# Patient Record
Sex: Female | Born: 1970 | Race: Black or African American | Hispanic: No | Marital: Single | Smoking: Former smoker
Health system: Southern US, Community
[De-identification: ages and names within clinical notes are randomized; demographics above are authoritative.]

## PROBLEM LIST (undated history)

## (undated) DIAGNOSIS — J449 Chronic obstructive pulmonary disease, unspecified: Secondary | ICD-10-CM

## (undated) DIAGNOSIS — E785 Hyperlipidemia, unspecified: Secondary | ICD-10-CM

## (undated) DIAGNOSIS — F419 Anxiety disorder, unspecified: Secondary | ICD-10-CM

## (undated) DIAGNOSIS — I10 Essential (primary) hypertension: Secondary | ICD-10-CM

## (undated) HISTORY — DX: Chronic obstructive pulmonary disease, unspecified: J44.9

## (undated) HISTORY — DX: Essential (primary) hypertension: I10

## (undated) HISTORY — PX: TUBAL LIGATION: SHX77

## (undated) HISTORY — DX: Hyperlipidemia, unspecified: E78.5

## (undated) HISTORY — DX: Anxiety disorder, unspecified: F41.9

## (undated) HISTORY — PX: OTHER SURGICAL HISTORY: SHX169

---

## 1998-02-13 ENCOUNTER — Other Ambulatory Visit: Admission: RE | Admit: 1998-02-13 | Discharge: 1998-02-13 | Payer: Self-pay | Admitting: Obstetrics and Gynecology

## 1998-03-28 ENCOUNTER — Other Ambulatory Visit: Admission: RE | Admit: 1998-03-28 | Discharge: 1998-03-28 | Payer: Self-pay | Admitting: Obstetrics & Gynecology

## 1998-05-03 ENCOUNTER — Ambulatory Visit (HOSPITAL_COMMUNITY): Admission: RE | Admit: 1998-05-03 | Discharge: 1998-05-03 | Payer: Self-pay | Admitting: Obstetrics & Gynecology

## 2000-09-24 ENCOUNTER — Encounter (INDEPENDENT_AMBULATORY_CARE_PROVIDER_SITE_OTHER): Payer: Self-pay | Admitting: *Deleted

## 2001-01-04 ENCOUNTER — Encounter: Admission: RE | Admit: 2001-01-04 | Discharge: 2001-01-04 | Payer: Self-pay | Admitting: Family Medicine

## 2001-10-23 ENCOUNTER — Other Ambulatory Visit: Admission: RE | Admit: 2001-10-23 | Discharge: 2001-10-23 | Payer: Self-pay | Admitting: Obstetrics and Gynecology

## 2001-10-31 ENCOUNTER — Ambulatory Visit (HOSPITAL_COMMUNITY): Admission: RE | Admit: 2001-10-31 | Discharge: 2001-10-31 | Payer: Self-pay | Admitting: Obstetrics and Gynecology

## 2001-10-31 ENCOUNTER — Encounter (INDEPENDENT_AMBULATORY_CARE_PROVIDER_SITE_OTHER): Payer: Self-pay | Admitting: Specialist

## 2002-07-06 ENCOUNTER — Encounter: Admission: RE | Admit: 2002-07-06 | Discharge: 2002-07-06 | Payer: Self-pay | Admitting: Internal Medicine

## 2003-05-30 ENCOUNTER — Encounter: Admission: RE | Admit: 2003-05-30 | Discharge: 2003-05-30 | Payer: Self-pay | Admitting: Internal Medicine

## 2003-09-30 ENCOUNTER — Emergency Department (HOSPITAL_COMMUNITY): Admission: AD | Admit: 2003-09-30 | Discharge: 2003-09-30 | Payer: Self-pay | Admitting: Family Medicine

## 2004-02-04 ENCOUNTER — Inpatient Hospital Stay (HOSPITAL_COMMUNITY): Admission: AD | Admit: 2004-02-04 | Discharge: 2004-02-05 | Payer: Self-pay | Admitting: *Deleted

## 2004-06-10 ENCOUNTER — Inpatient Hospital Stay (HOSPITAL_COMMUNITY): Admission: AD | Admit: 2004-06-10 | Discharge: 2004-06-10 | Payer: Self-pay | Admitting: Obstetrics and Gynecology

## 2004-07-10 ENCOUNTER — Inpatient Hospital Stay (HOSPITAL_COMMUNITY): Admission: AD | Admit: 2004-07-10 | Discharge: 2004-07-11 | Payer: Self-pay | Admitting: Obstetrics and Gynecology

## 2004-08-04 ENCOUNTER — Inpatient Hospital Stay (HOSPITAL_COMMUNITY): Admission: AD | Admit: 2004-08-04 | Discharge: 2004-08-04 | Payer: Self-pay | Admitting: Obstetrics and Gynecology

## 2004-08-31 ENCOUNTER — Inpatient Hospital Stay (HOSPITAL_COMMUNITY): Admission: AD | Admit: 2004-08-31 | Discharge: 2004-08-31 | Payer: Self-pay | Admitting: Obstetrics and Gynecology

## 2004-11-09 ENCOUNTER — Inpatient Hospital Stay (HOSPITAL_COMMUNITY): Admission: AD | Admit: 2004-11-09 | Discharge: 2004-11-11 | Payer: Self-pay | Admitting: Obstetrics and Gynecology

## 2004-11-18 ENCOUNTER — Inpatient Hospital Stay (HOSPITAL_COMMUNITY): Admission: AD | Admit: 2004-11-18 | Discharge: 2004-11-20 | Payer: Self-pay | Admitting: Obstetrics and Gynecology

## 2004-11-23 ENCOUNTER — Inpatient Hospital Stay (HOSPITAL_COMMUNITY): Admission: AD | Admit: 2004-11-23 | Discharge: 2004-11-30 | Payer: Self-pay | Admitting: Obstetrics and Gynecology

## 2004-11-28 ENCOUNTER — Encounter (INDEPENDENT_AMBULATORY_CARE_PROVIDER_SITE_OTHER): Payer: Self-pay | Admitting: *Deleted

## 2004-11-30 ENCOUNTER — Encounter (INDEPENDENT_AMBULATORY_CARE_PROVIDER_SITE_OTHER): Payer: Self-pay | Admitting: *Deleted

## 2005-01-11 ENCOUNTER — Other Ambulatory Visit: Admission: RE | Admit: 2005-01-11 | Discharge: 2005-01-11 | Payer: Self-pay | Admitting: Obstetrics and Gynecology

## 2005-07-19 IMAGING — US US OB COMP LESS 14 WK
1 series · 14 of 28 positions shown · non-contrast
Comparison: none

CLINICAL DATA: Early pregnancy.  5 week 3 day gestational age by LMP.  Severe headaches.  Evaluate viability and dating.
 OBSTETRICAL ULTRASOUND WITH TRANSVAGINAL:
 A single intrauterine gestational sac is seen on today?s study with a mean sac diameter of 7 mm, corresponding with a gestational age of 5 weeks 3 days.  A yolk sac is seen.  There is no evidence of subchorionic hemorrhage.  No fibroids or other uterine abnormality is identified.  
 A complex cystic lesion is noted in the right ovary measuring 2.3 x 1.4 cm which is consistent with a hemorrhagic corpus luteum.  The left ovary is normal in appearance.  A tiny amount of free fluid is noted.  
 IMPRESSION
 Single intrauterine gestational sac, with estimated gestational age of 5 weeks 3 days by mean sac diameter.  This agrees with stated LMP.  
 2.3 cm hemorrhagic corpus luteum noted in the right ovary.  Small amount of free fluid also noted.

[Series 1: us ob comp less 14 wk · 0.33mm/px · 14 of 40 slices shown]
[im 2/40]
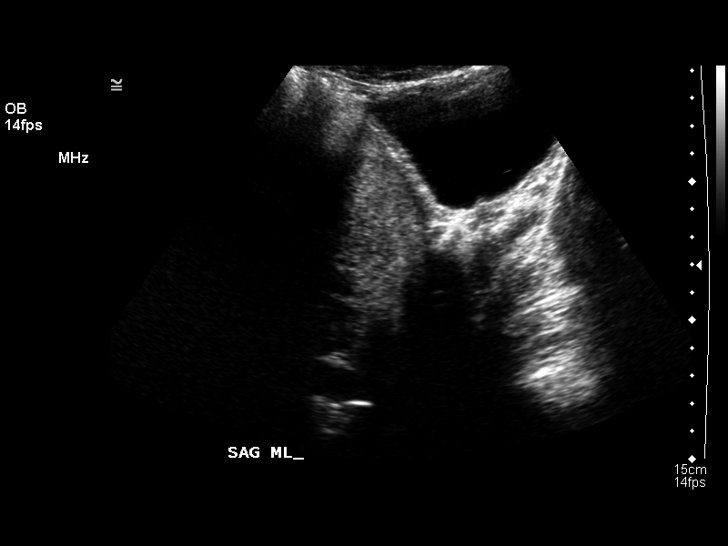
[im 5/40]
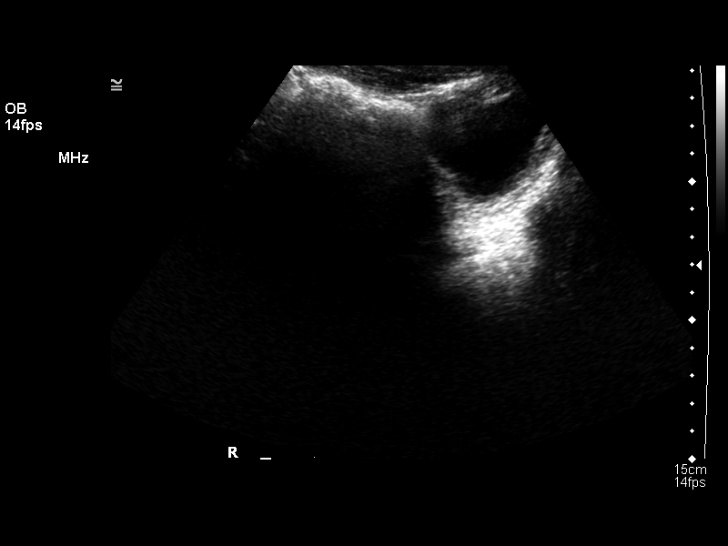
[im 8/40]
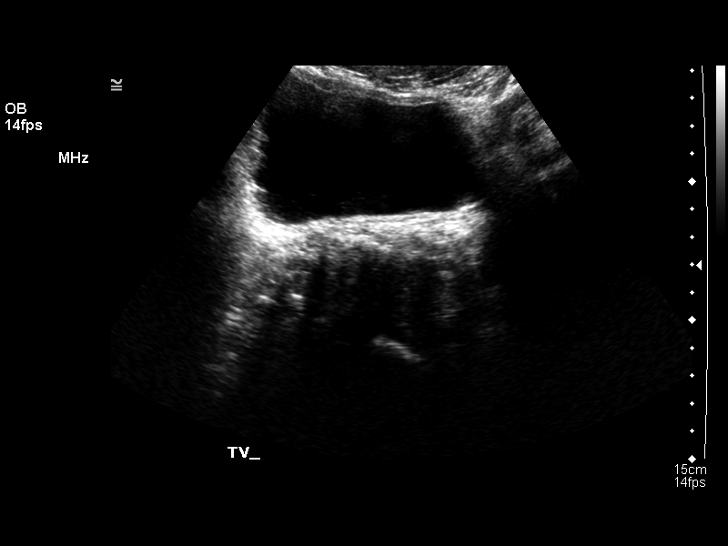
[im 11/40]
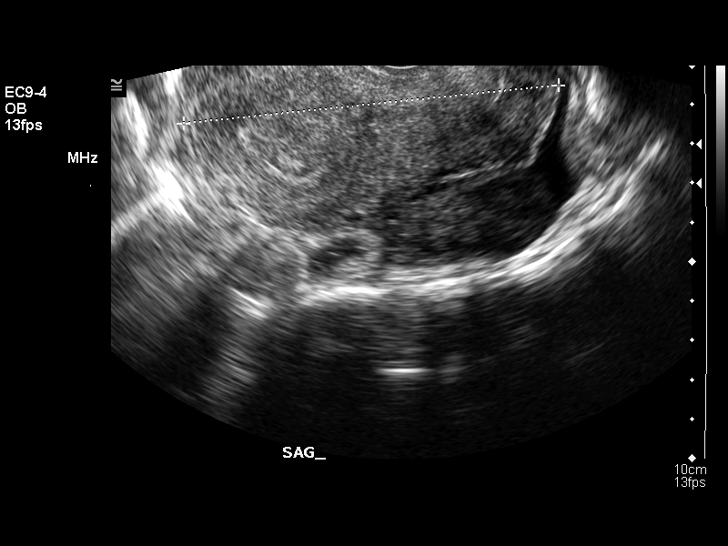
[im 14/40]
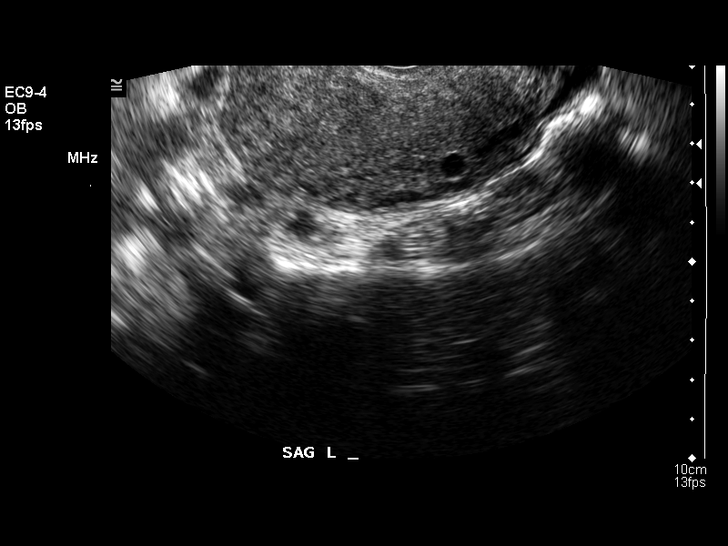
[im 16/40]
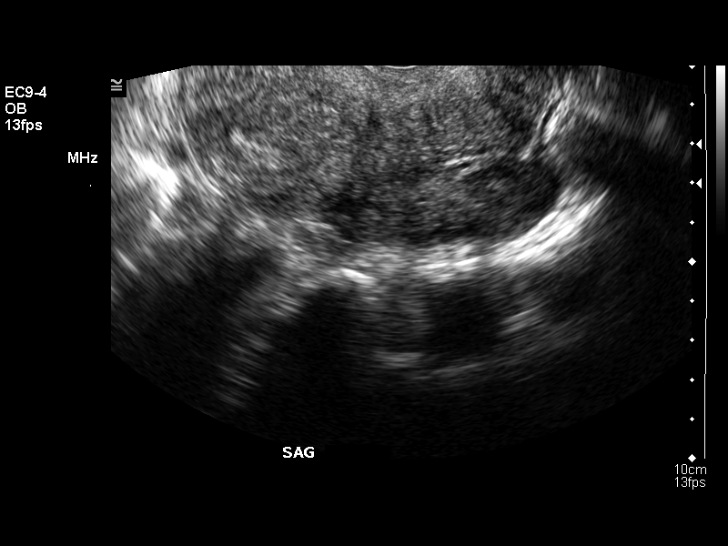
[im 19/40]
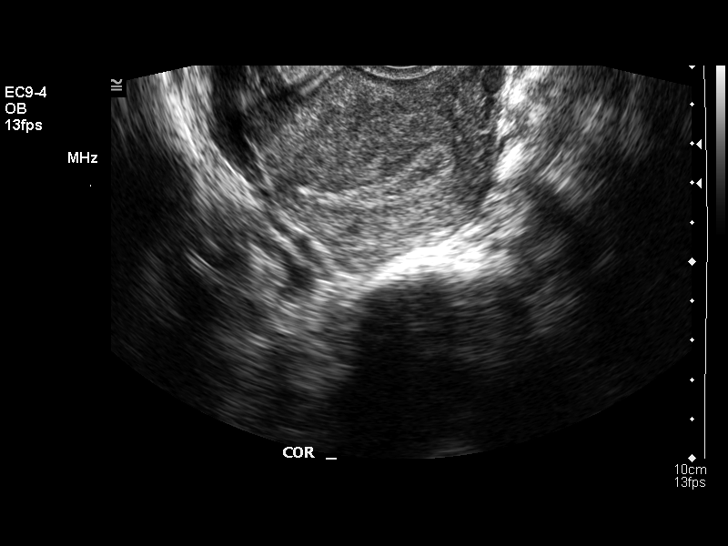
[im 22/40]
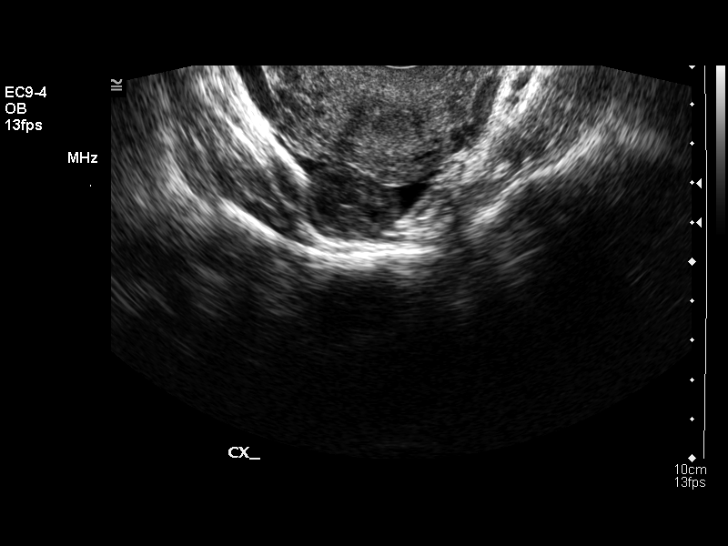
[im 25/40]
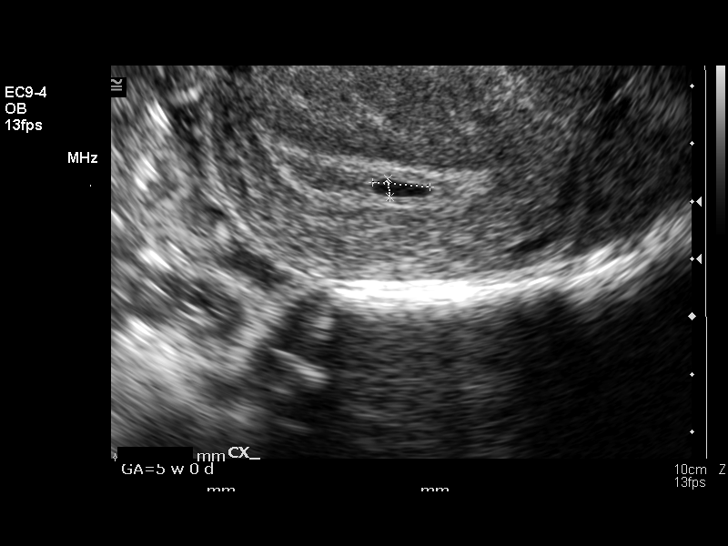
[im 28/40]
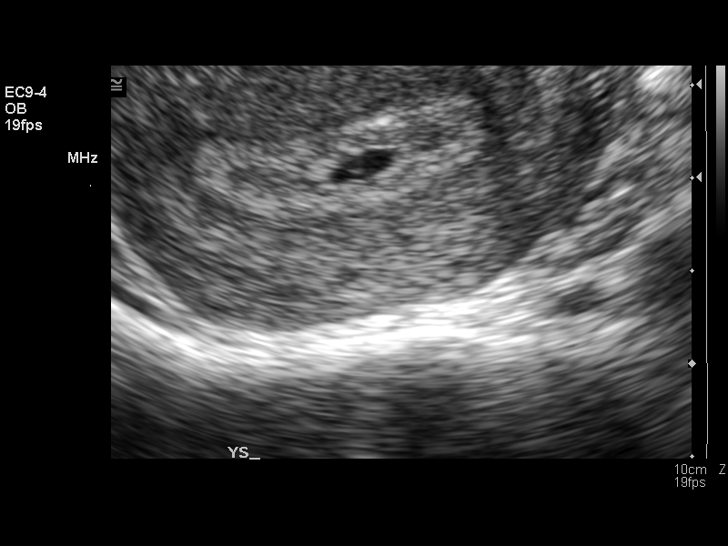
[im 31/40]
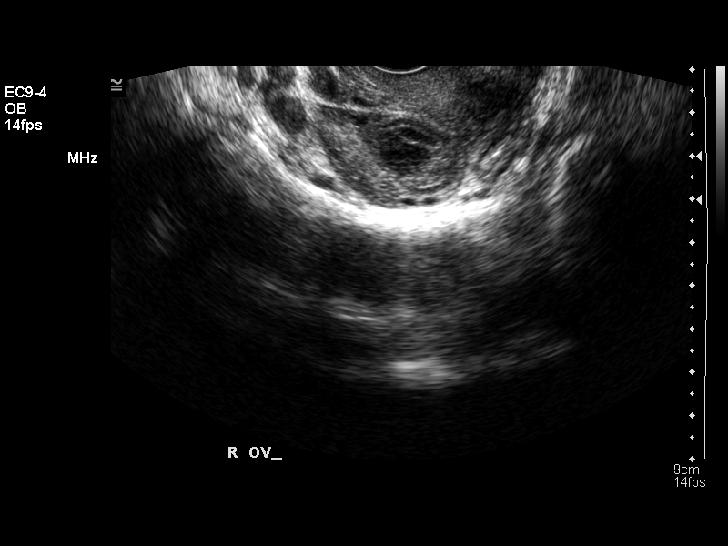
[im 34/40]
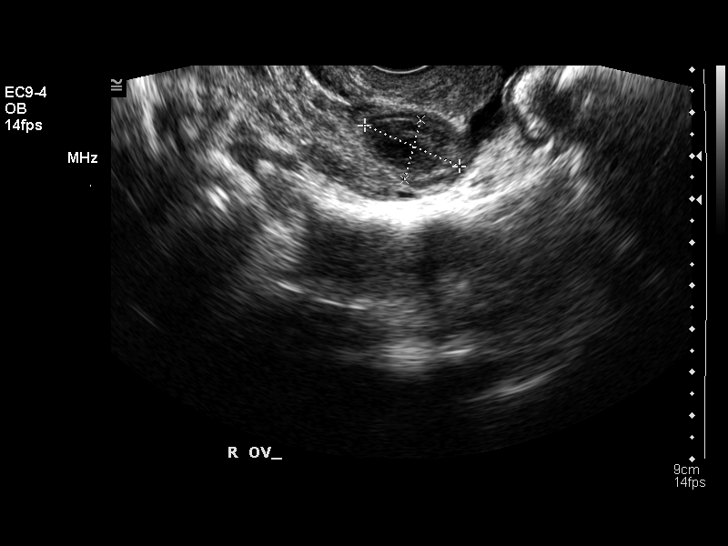
[im 37/40]
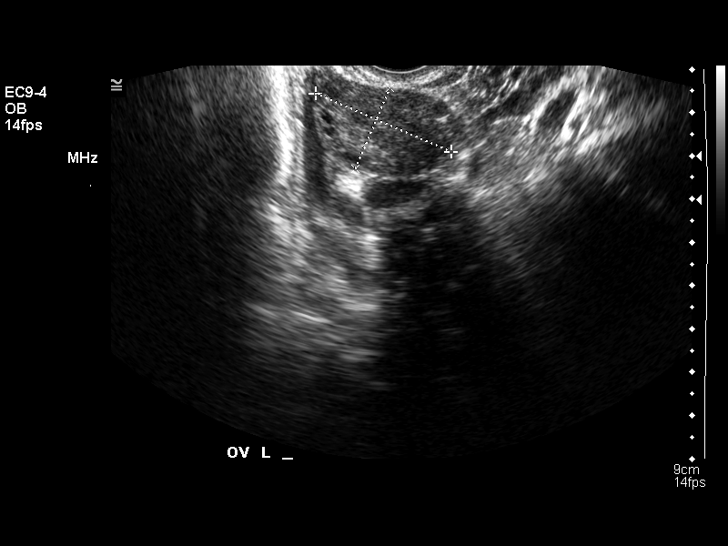
[im 40/40]
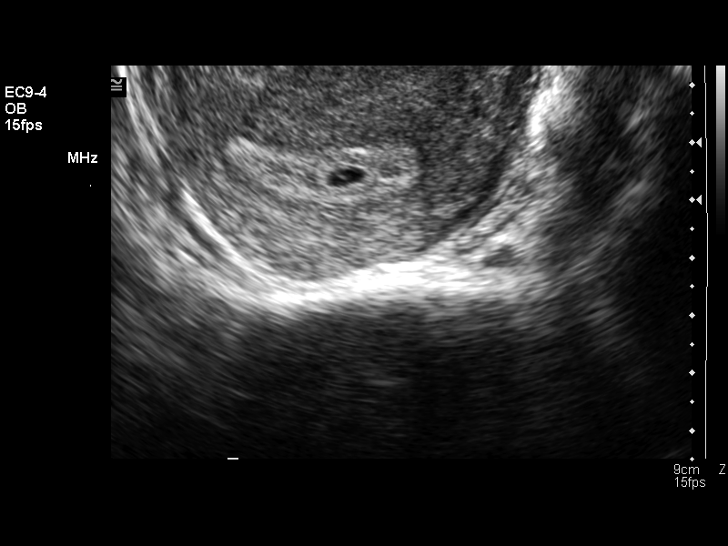

[14 of 28 positions shown; findings below may reference images not displayed]

## 2005-08-19 IMAGING — US US OB COMP LESS 14 WK
1 series · 18 of 28 positions shown · non-contrast
Comparison: 06/10/04.

CLINICAL DATA: 9 weeks pregnancy with vaginal bleeding.
 ULTRASOUND OB COMPLETE <14 WEEKS ? 07/11/04

[Series 1: us ob comp<14 wk · 18 of 31 slices shown]
[im 1/31]
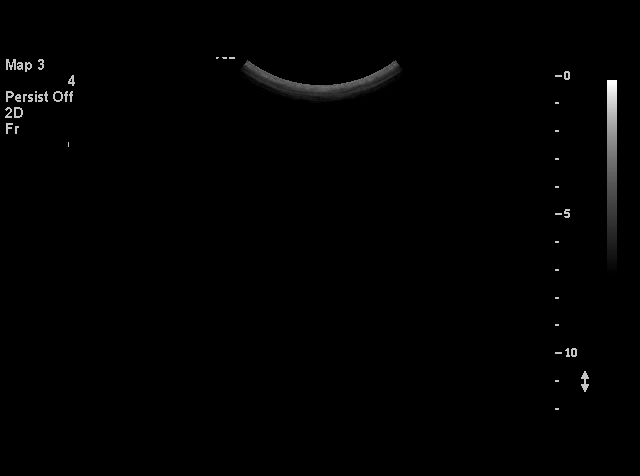
[im 3/31]
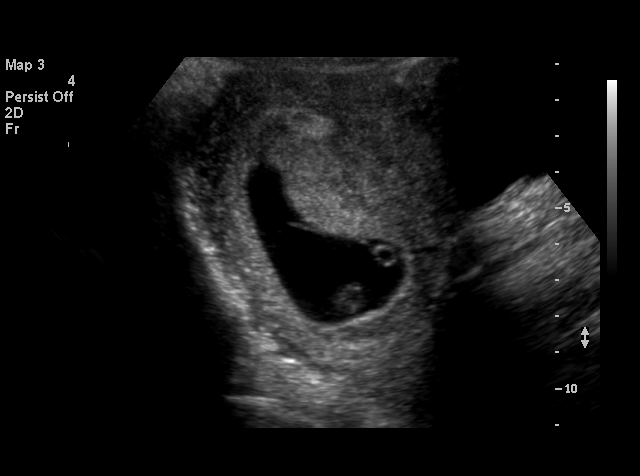
[im 4/31]
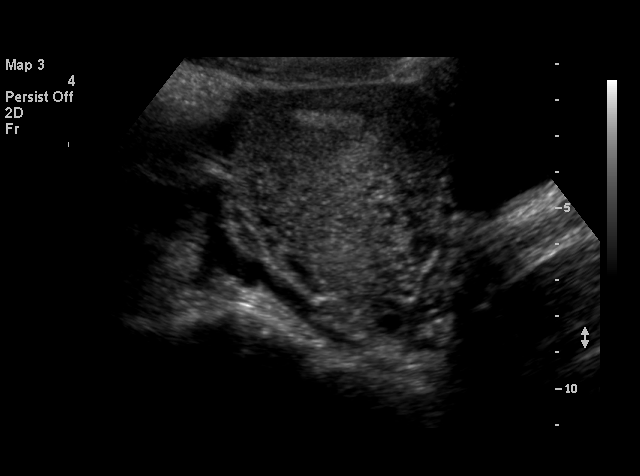
[im 6/31]
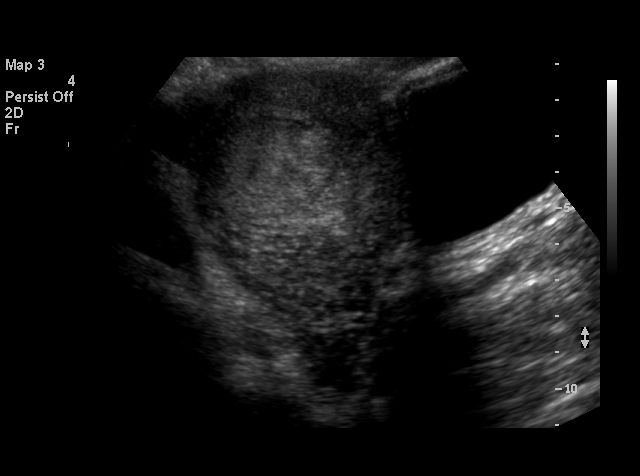
[im 8/31]
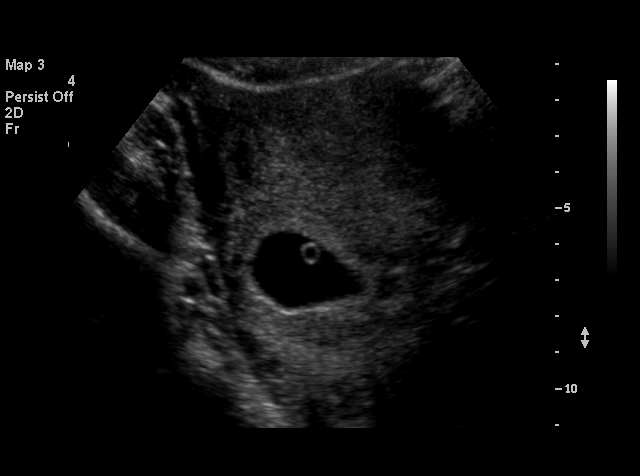
[im 9/31]
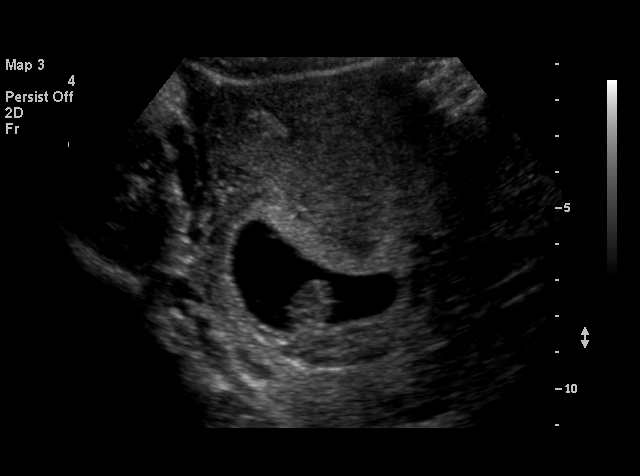
[im 12/31]
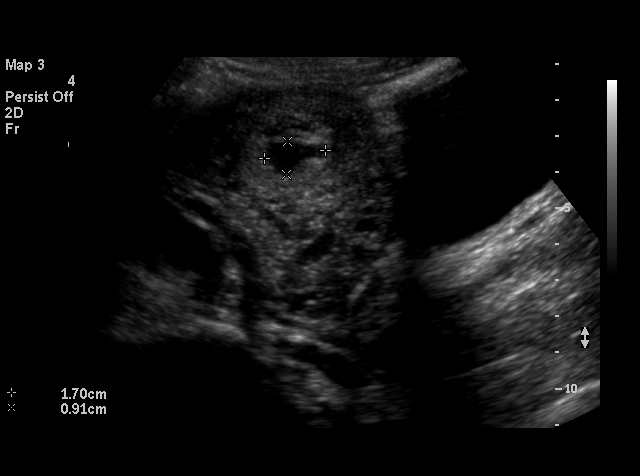
[im 13/31]
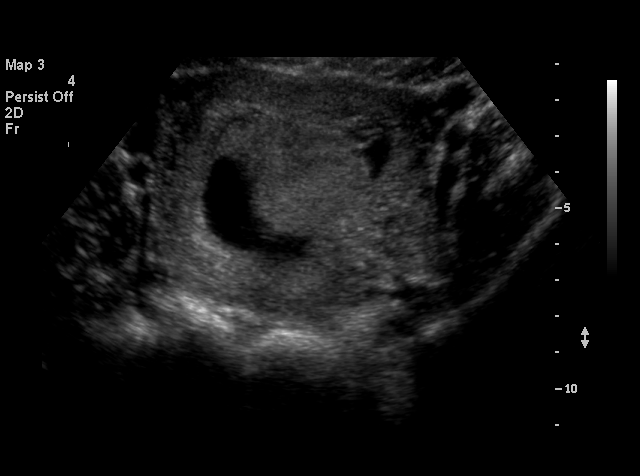
[im 15/31]
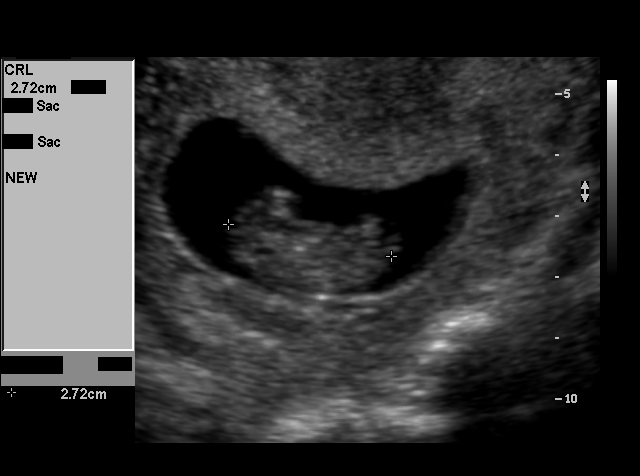
[im 16/31]
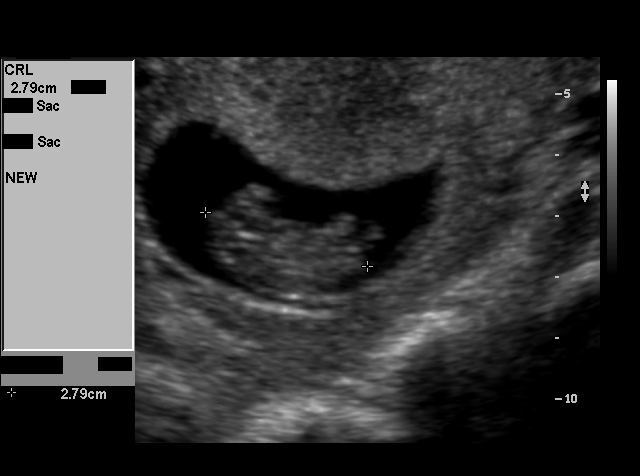
[im 18/31]
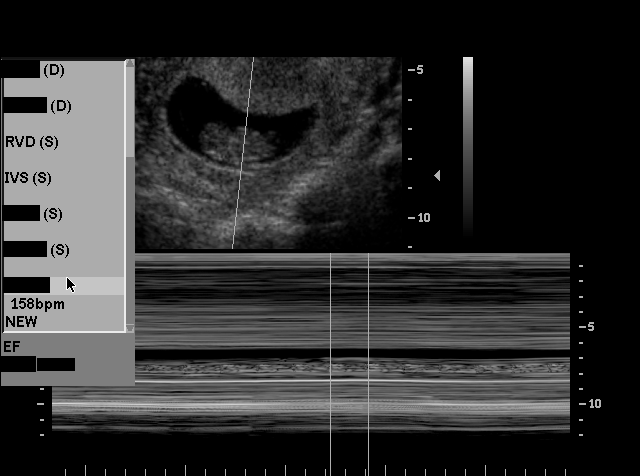
[im 19/31]
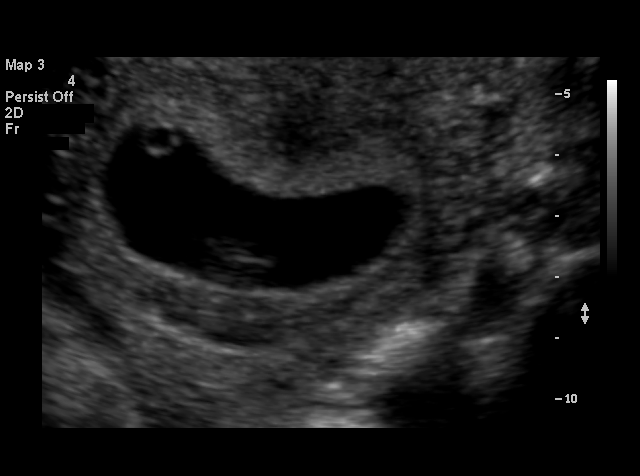
[im 22/31]
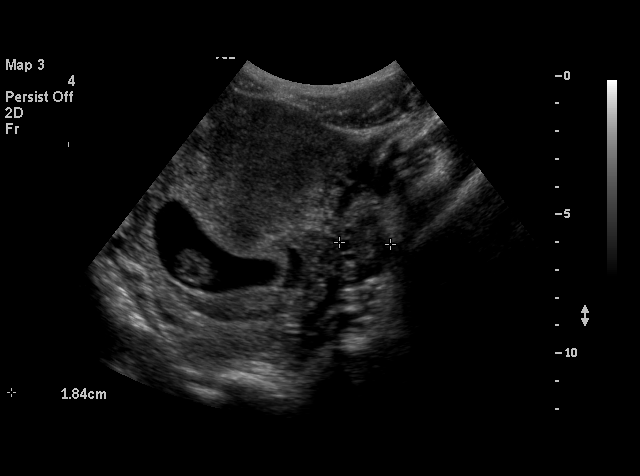
[im 24/31]
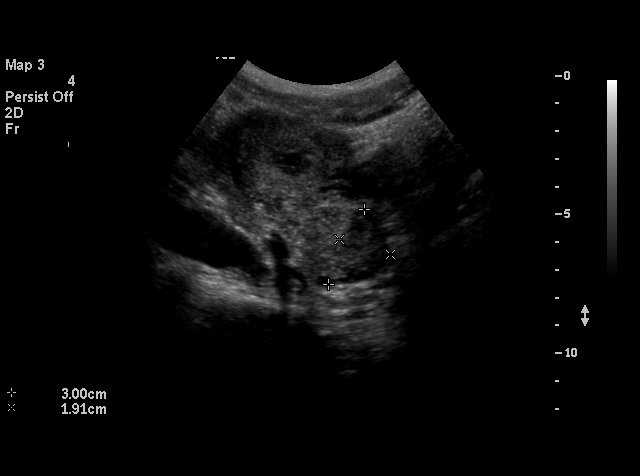
[im 25/31]
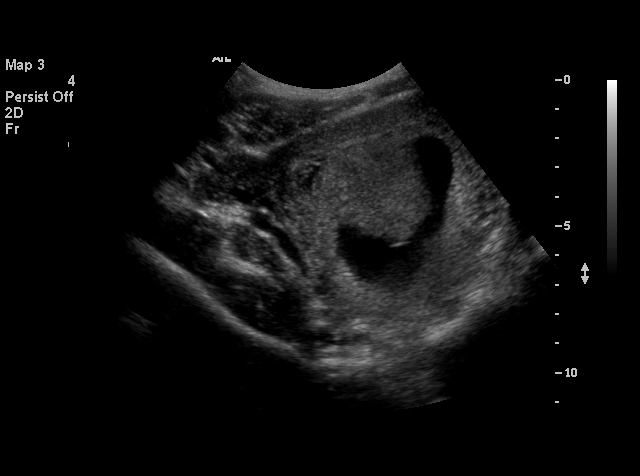
[im 27/31]
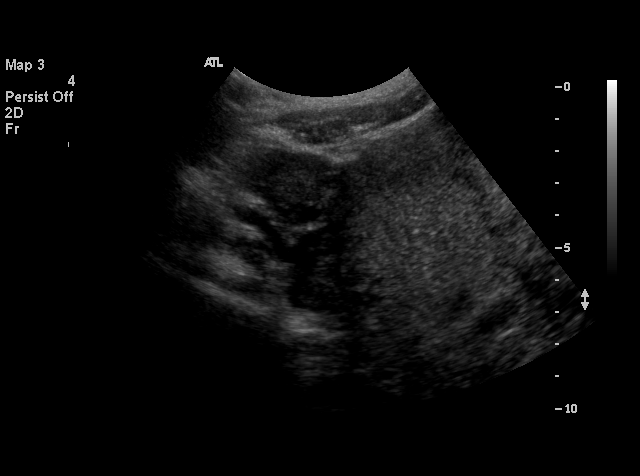
[im 28/31]
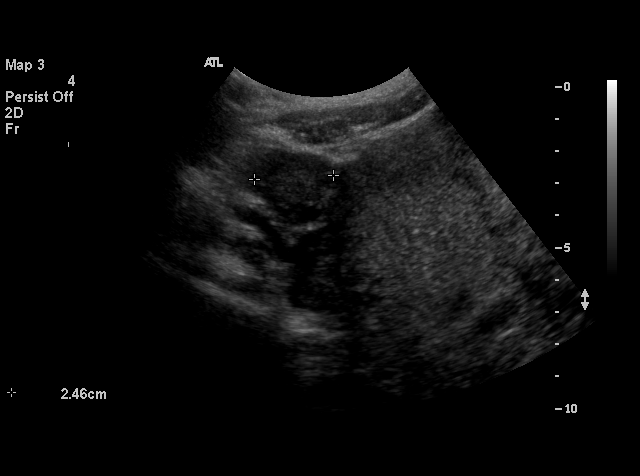
[im 31/31]
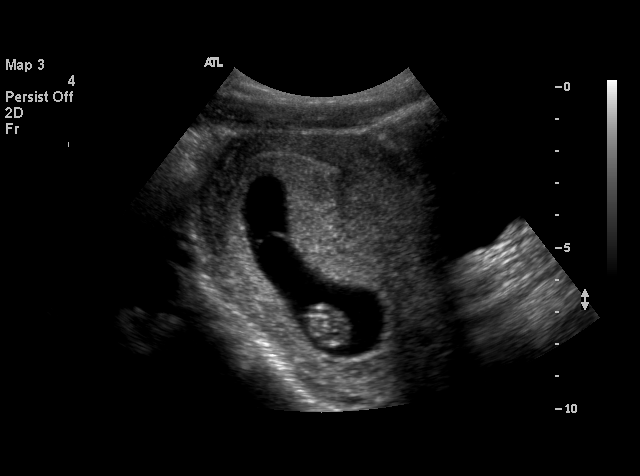

[18 of 28 positions shown; findings below may reference images not displayed]

FINDINGS: A single living intrauterine gestation is identified with an estimated gestational age of 9 weeks 4 days based on crown-rump length.  Comparing back to the previous examination, this is compatible with appropriate progression of the pregnancy.  A small subchorionic hemorrhage is apparent on the current exam.
 The ovaries are sonographically normal.  No free fluid is apparent in the cul-de-sac.   
 IMPRESSION
 1.  Living intrauterine gestation at 9 weeks 4 days by crown-rump length.  
 2.  Small subchorionic hemorrhage is noted.

## 2005-10-26 ENCOUNTER — Emergency Department (HOSPITAL_COMMUNITY): Admission: EM | Admit: 2005-10-26 | Discharge: 2005-10-26 | Payer: Self-pay | Admitting: Family Medicine

## 2005-12-19 IMAGING — US US OB LIMITED
1 series · 13 of 23 positions shown · non-contrast
Comparison: none

CLINICAL DATA: 27 week 2 day assigned gestational age.  Leaking fluid.  Preterm labor.  Evaluate amniotic fluid volume and cervix.

[Series 1: us ob limited · 0.35mm/px · 13 of 23 slices shown]
[im 1/23]
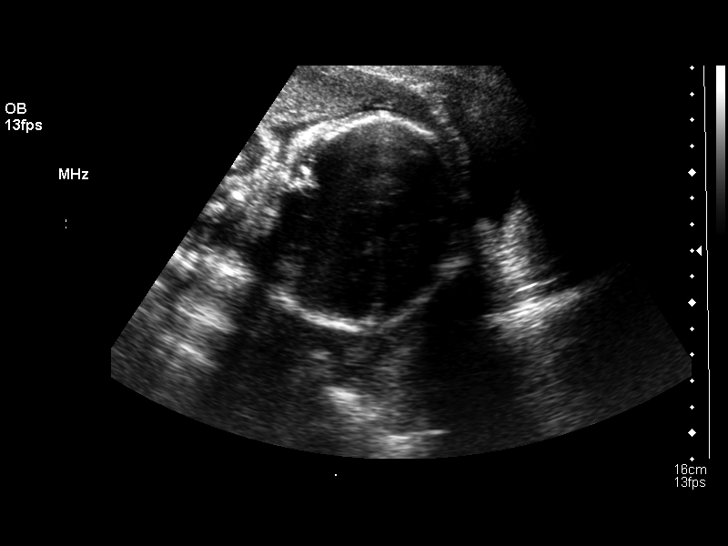
[im 3/23]
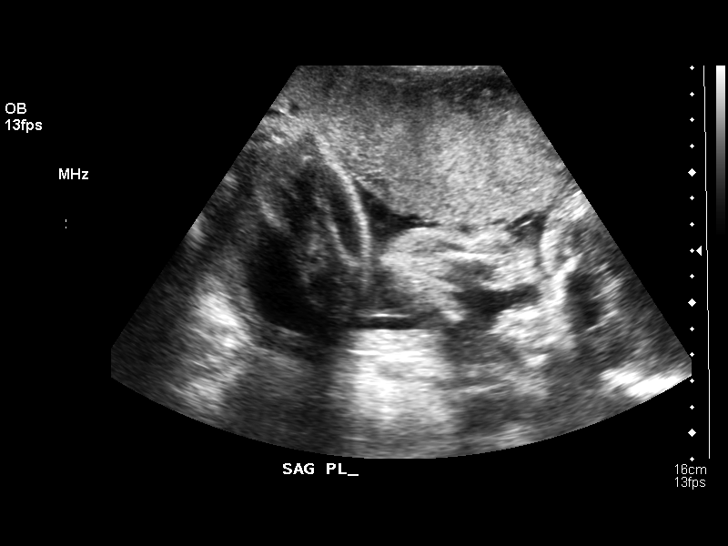
[im 5/23]
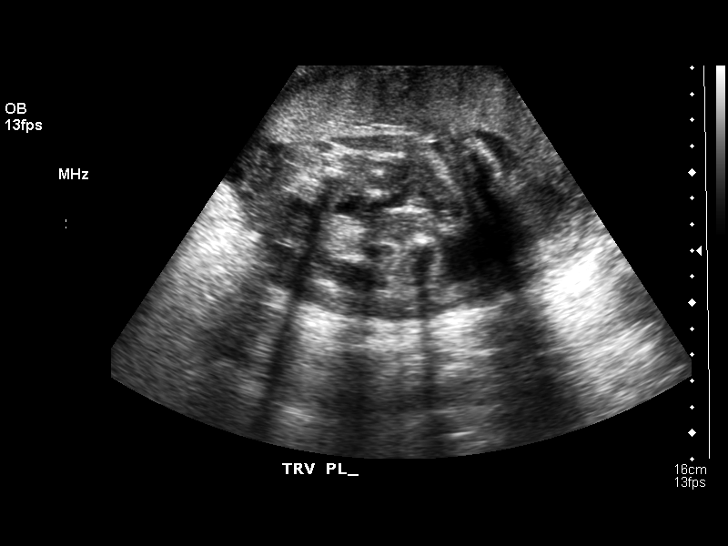
[im 7/23]
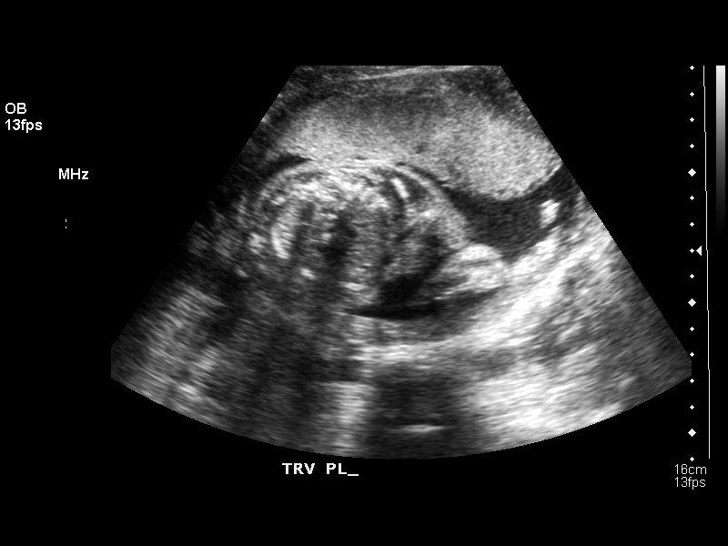
[im 8/23]
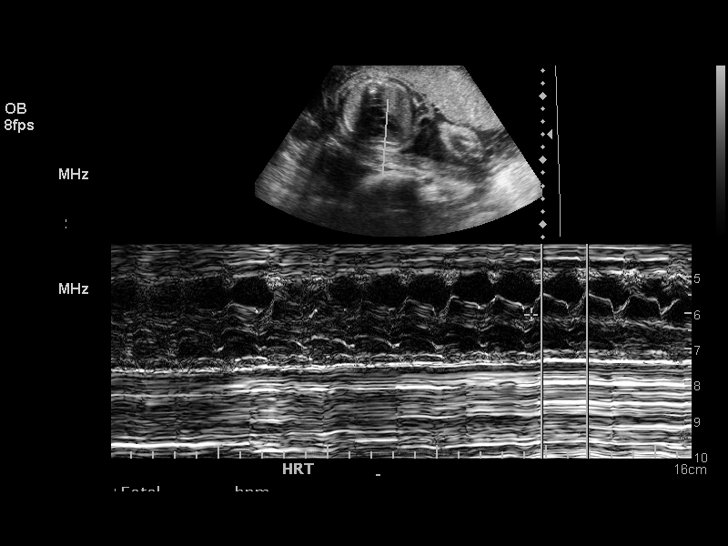
[im 10/23]
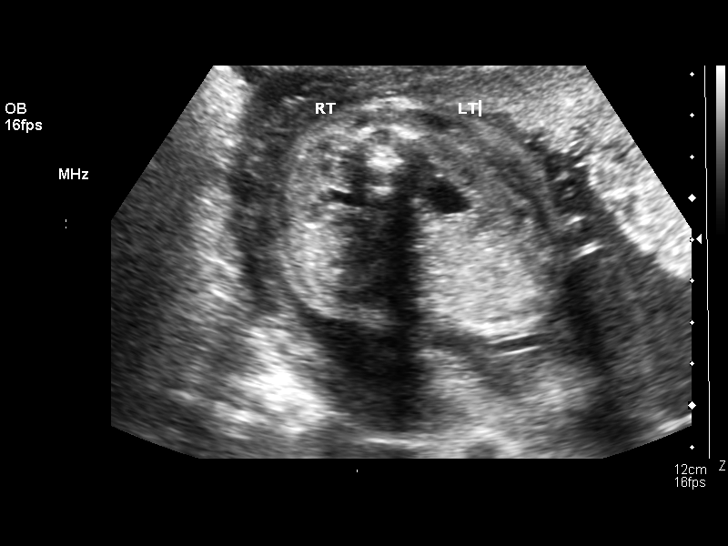
[im 12/23]
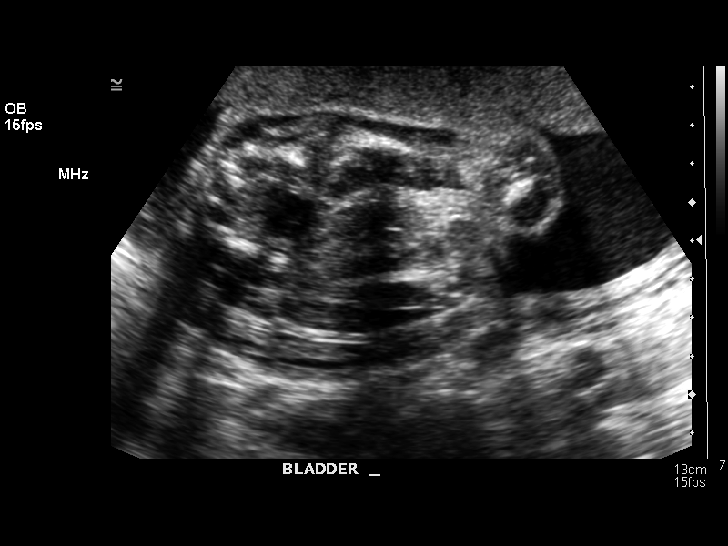
[im 14/23]
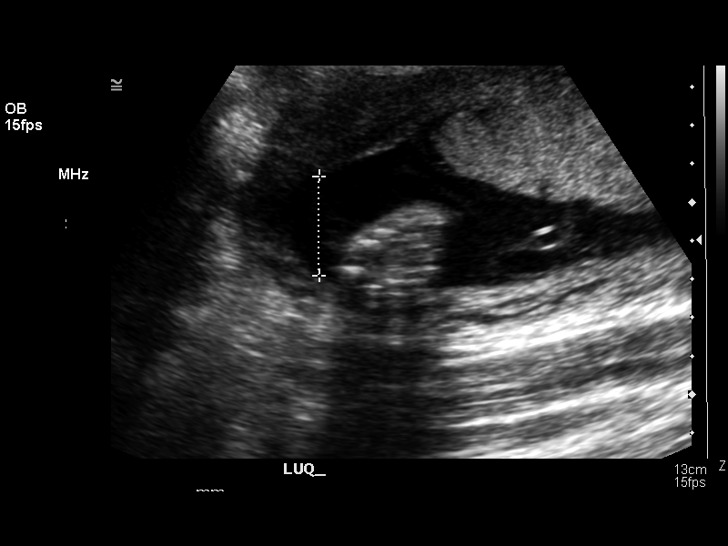
[im 16/23]
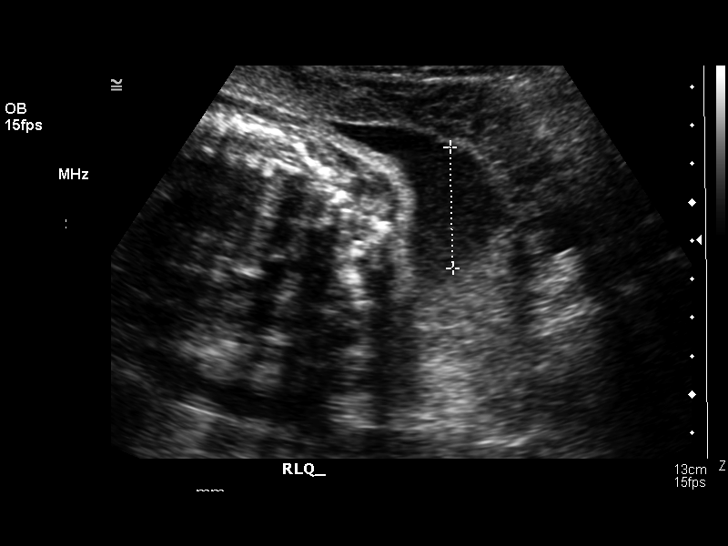
[im 17/23]
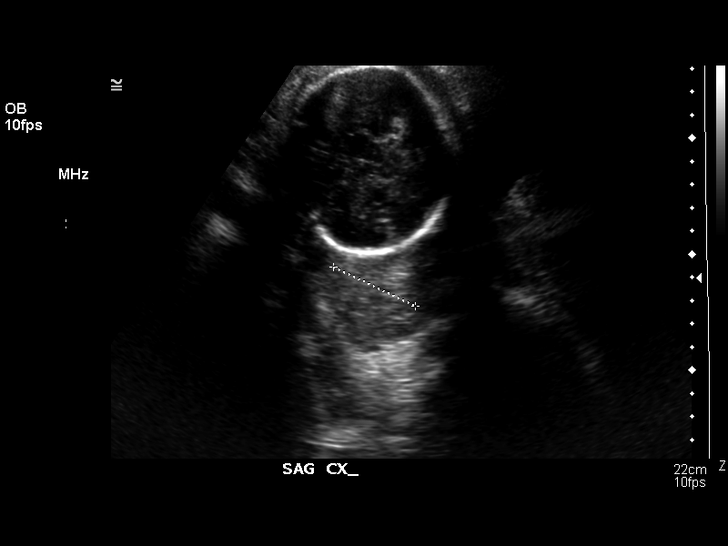
[im 19/23]
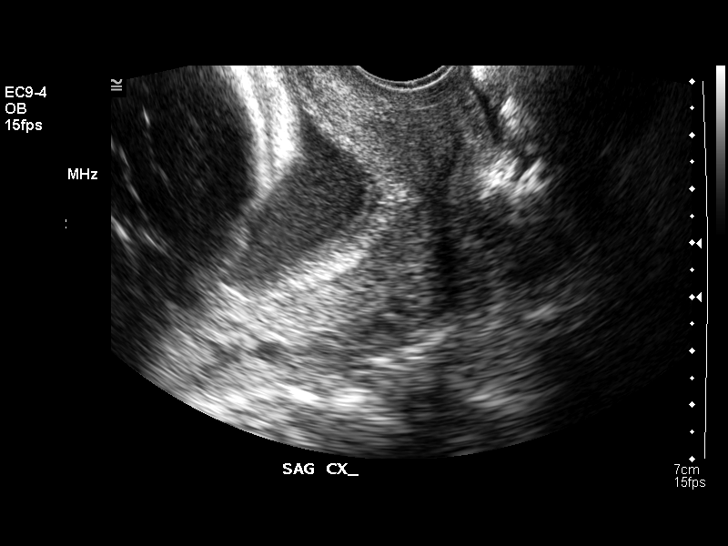
[im 21/23]
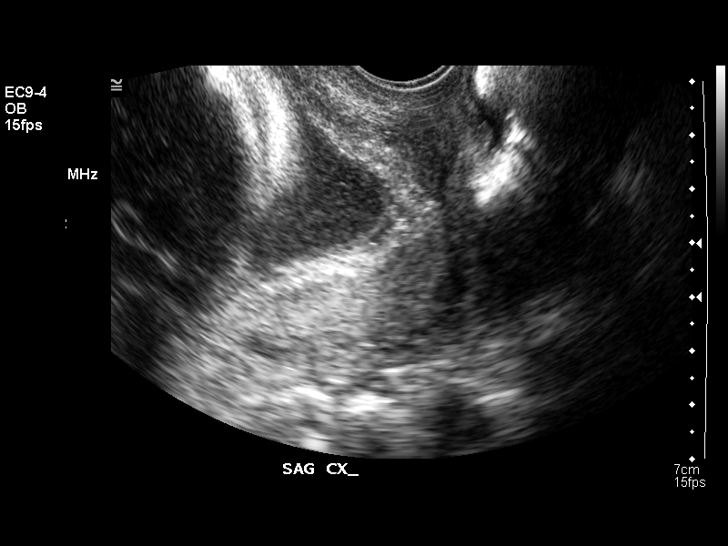
[im 23/23]
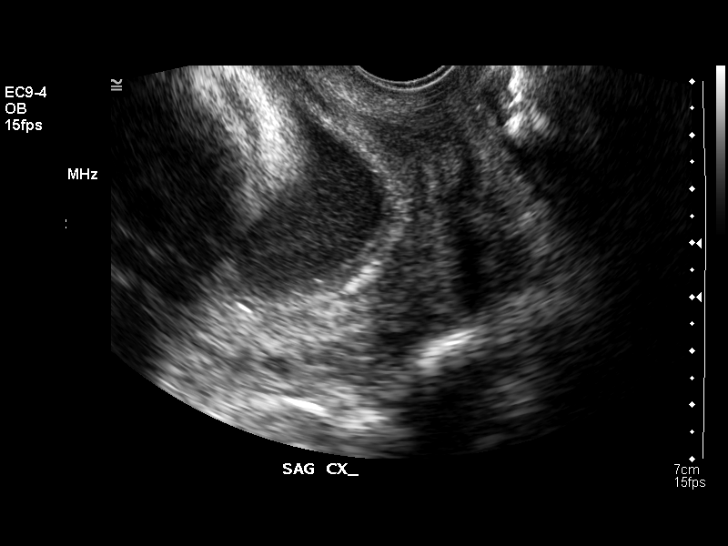

[13 of 23 positions shown; findings below may reference images not displayed]

LIMITED OBSTETRICAL ULTRASOUND WITH TRANSVAGINAL:
 Number of Fetuses: 1
 Heart Rate:  144
 Movement:  Yes
 Breathing:  Yes
 Presentation:  Cephalic
 Placental Location:  Anterior
 Grade:  II
 Previa:  No
 Amniotic Fluid (Subjective):  Decreased
 Amniotic Fluid (Objective):  10.7 cm AFI (5th -95th%ile = 9.5 ? 22.6 cm for 27 wks)

 Fetal measurements and complete anatomic evaluation were not requested.  The following fetal anatomy was visualized during this exam:  Stomach, kidneys, and bladder.  
 Comment:  The left renal pelvis is prominent measuring 8 mm in AP diameter.  The right renal pelvis is within normal limits measuring 3 mm.  

 MATERNAL UTERINE AND ADNEXAL FINDINGS
 Cervix:  1.7 cm Transvaginally, with funneling of the internal os noted.
IMPRESSION: 1.  Single living intrauterine fetus in cephalic presentation.  
 2.  Subjectively decreased amniotic fluid volume for 27 weeks, with AFI of 10.7 cm.  
 3.  Shortened cervix measuring 1.7 cm, with funneling of the internal os noted.  
 4.  Mild left fetal renal pyelectasis noted.  Ultrasound follow-up is recommended after 33 weeks gestational age.

## 2005-12-27 IMAGING — US US OB COMP +14 WK
1 series · 13 of 28 positions shown · non-contrast
Comparison: none

CLINICAL DATA: 28 weeks estimated gestational age with heavy vaginal bleeding earlier today.

[Series 1: us ob comp +14 wk · 0.33mm/px · 13 of 50 slices shown]
[im 2/50]
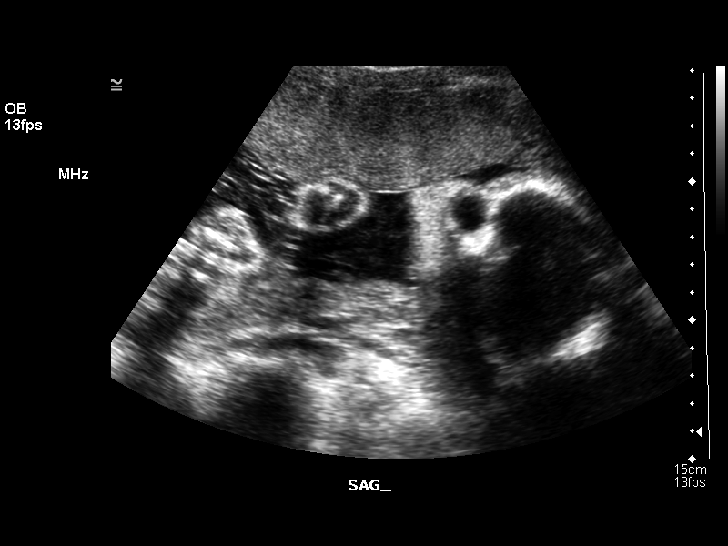
[im 6/50]
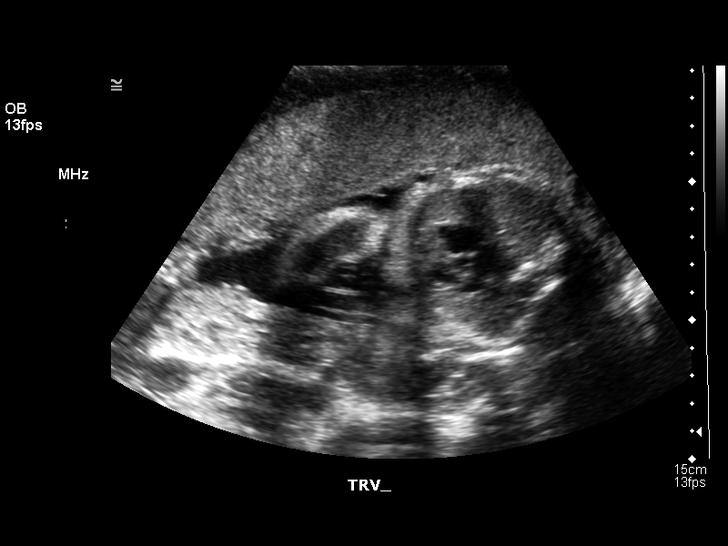
[im 10/50]
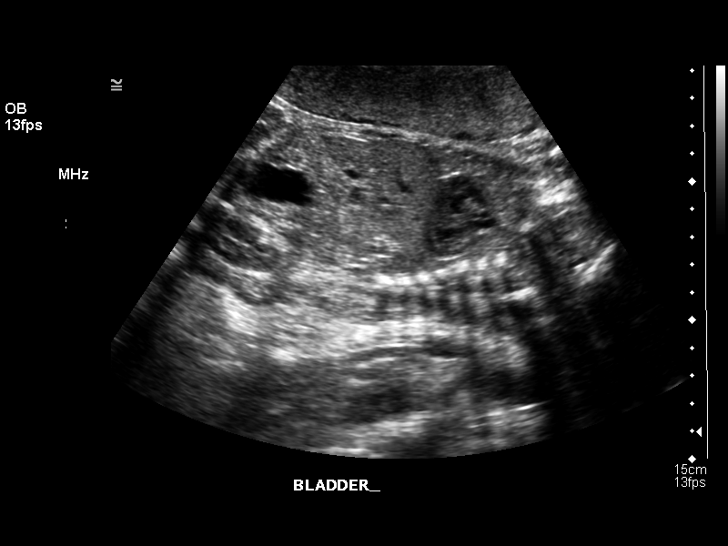
[im 13/50]
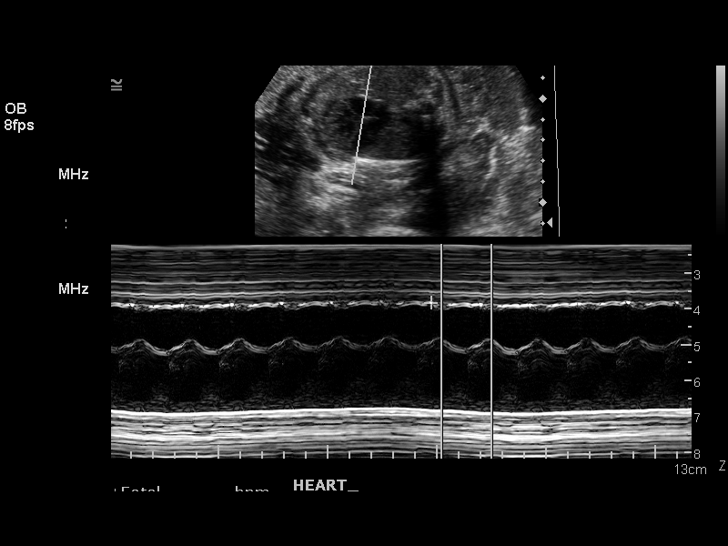
[im 17/50]
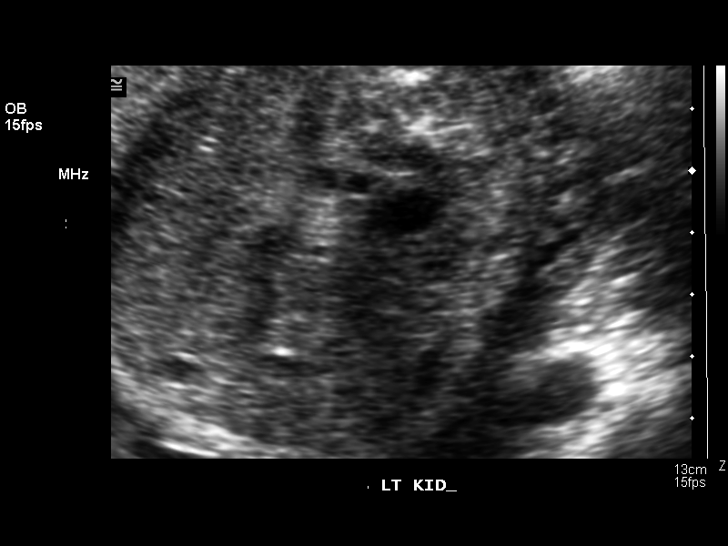
[im 20/50]
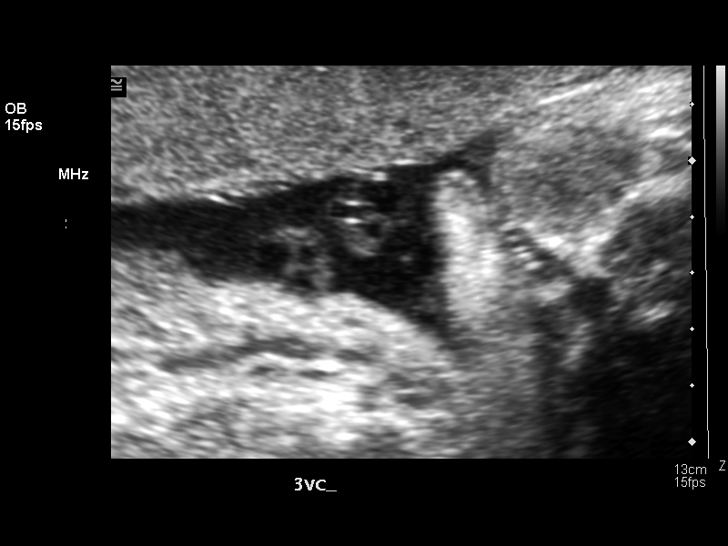
[im 26/50]
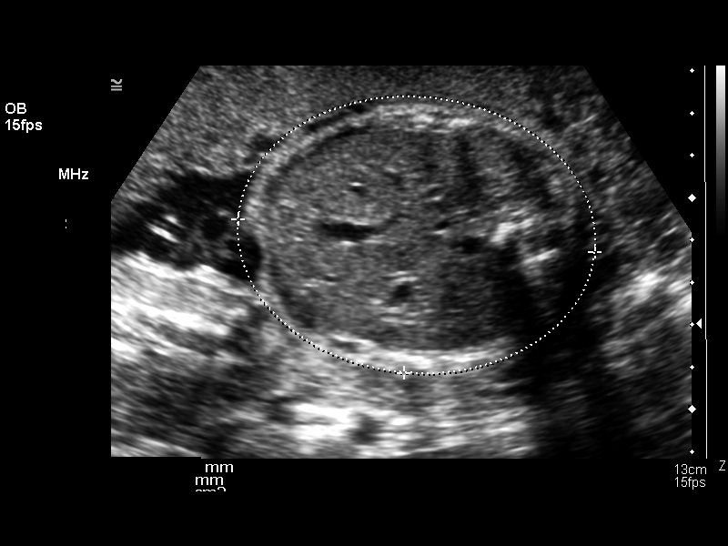
[im 30/50]
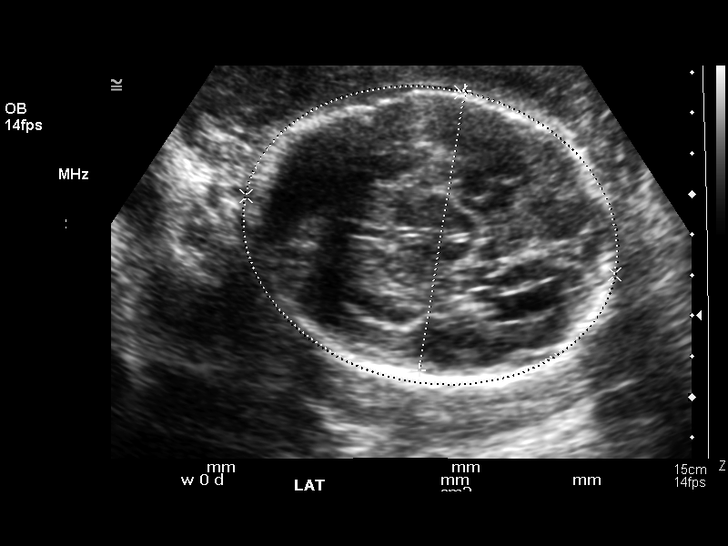
[im 33/50]
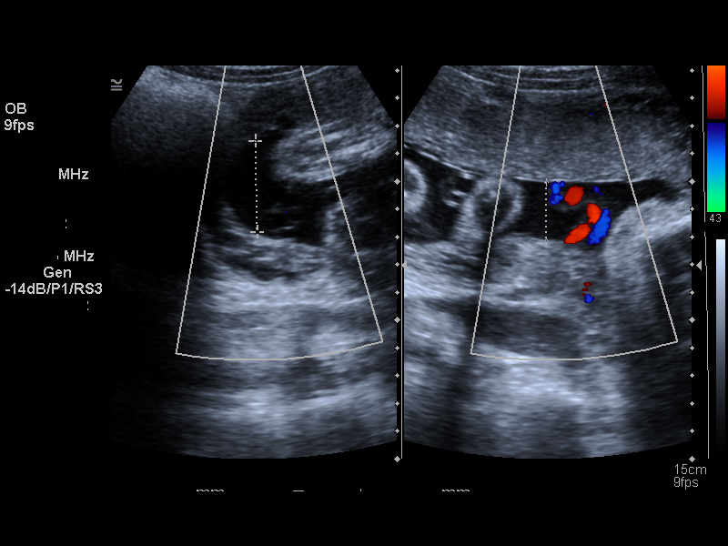
[im 37/50]
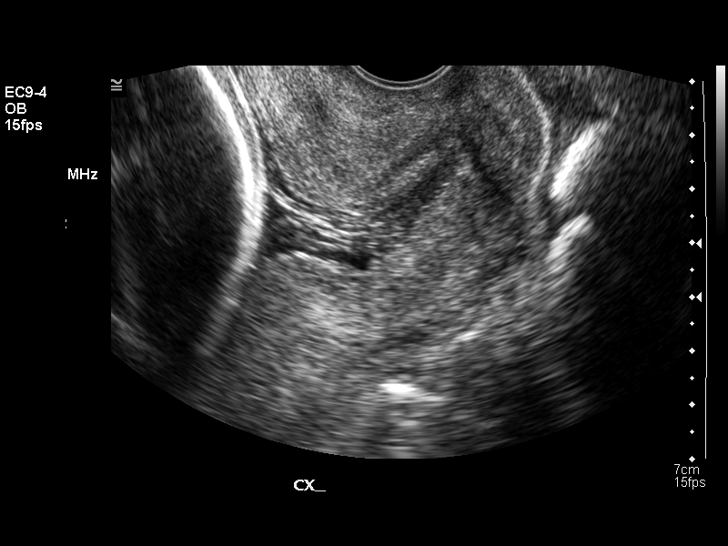
[im 40/50]
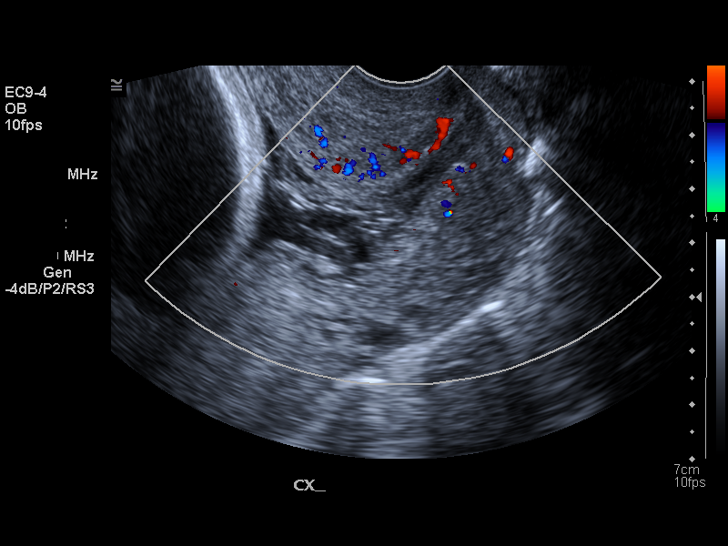
[im 44/50]
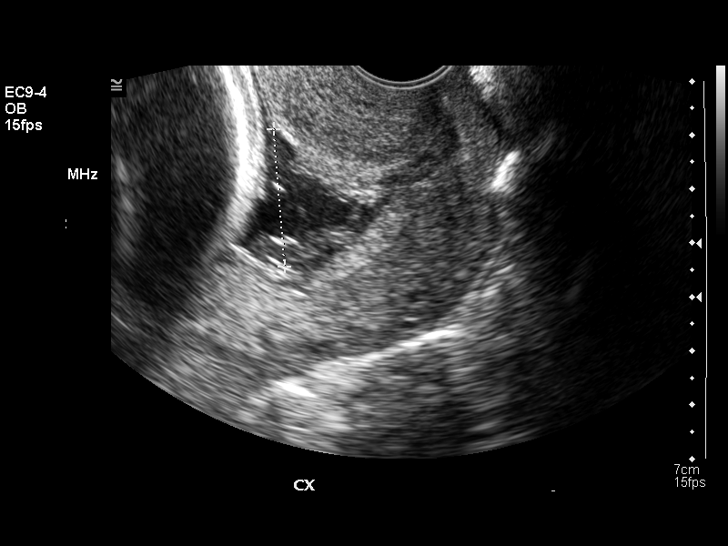
[im 48/50]
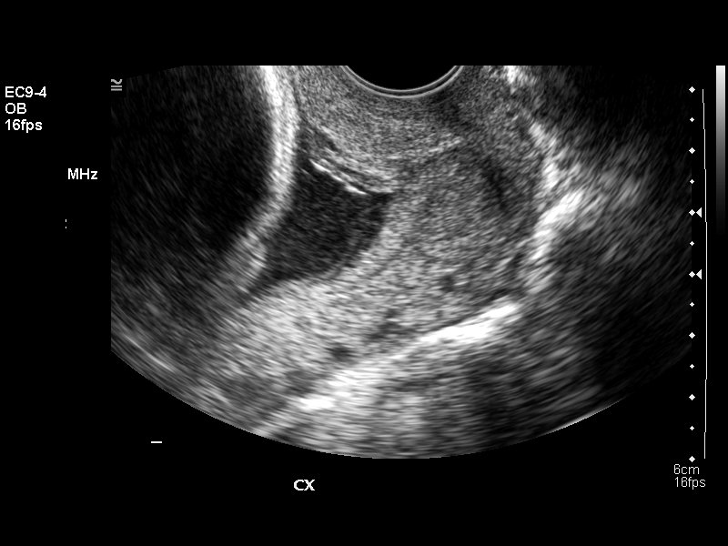

[13 of 28 positions shown; findings below may reference images not displayed]

OBSTETRICAL ULTRASOUND WITH TRANSVAGINAL:
Number of Fetuses: 1
Heart Rate:  131
Movement:  Yes
Breathing:    No  
Presentation:  Cephalic
Placental Location:  Anterior
Grade:  I
Previa:  No
Comment:  No evidence for retroplacental, preplacental or marginal hemorrhage is seen.  During evaluation of the cervix endovaginally, a small amount of acute blood was seen posterior to the endocervical canal measuring approximately 2.0 x 1.7 cm compatible with a small amount of clot at the level of the internal os.
Amniotic Fluid (Subjective):  Normal
Amniotic Fluid (Objective):   11.5 cm AFI (5th -95th%ile = 9.4 ? 22.8 cm for 28 wks)

FETAL BIOMETRY
BPD:  7.0 cm   28 w 0 d
HC:  26.1 cm   28 w 2 d
AC:  23.5 cm   27 w 6 d
FL:  5.1 cm   27 w 3 d

MEAN GA:  27 w 6 d

EFW:  3362 g (H) 50th ? 75th%ile (2779 ? 1110 g) For 28 wks

FETAL ANATOMY
Lateral Ventricles:    Visualized 
Thalami/CSP:      Visualized 
Posterior Fossa:  Not visualized 
Nuchal Region:    N/A
Spine:      Not visualized 
4 Chamber Heart on Left:      Visualized 
Stomach on Left:      Visualized 
3 Vessel Cord:    Visualized 
Cord Insertion site:    Not visualized 
Kidneys:  Visualized 
Bladder:  Visualized 
Extremities:      Not visualized 

MATERNAL UTERINE AND ADNEXAL FINDINGS
Cervix:   1.9 cm Transvaginally
Comment:  Dynamic shortening of the cervix was identified with the application of fundal pressure.  Dilatation at the level of the internal cervical os during the application of pressure was seen measuring 2.5 cm.
IMPRESSION: 1.  Single intrauterine pregnancy demonstrating an estimated gestational age by ultrasound of 27 weeks and 6 days.  Correlation with assigned gestational age of 28 weeks and 3 days suggests appropriate growth.  
2.  Subjectively and quantitatively normal amniotic fluid volume.
3.  No evidence for retroplacental, preplacental or marginal hemorrhage is seen, however a small amount of acute clot is seen directly behind the level of the internal cervical os and in this patient with an anterior placenta suggests that this represents blood which has dissected down to the level of the internal os.  Currently this measures approximately 2 x 2 cm.  
4.  Dynamic shortening of the cervix is noted with the application of fundal pressure.  Dilation at the level of the internal cervical os measures 2.6 cm at the time of maximal shortening.  
5.  No late developing fetal anatomic abnormalities are identified associated with the lateral ventricles, four chamber heart, stomach, or bladder.  Again noted is the presence of mild left fetal renal pyelectasis measuring 7.4 mm in AP width.  The right renal pelvis measures 4.3 mm in AP width.  This needs to be followed up beyond 33 weeks of estimated gestational age to assess the need for postnatal evaluation.  

</u12:p>

## 2006-08-22 ENCOUNTER — Emergency Department (HOSPITAL_COMMUNITY): Admission: EM | Admit: 2006-08-22 | Discharge: 2006-08-22 | Payer: Self-pay | Admitting: Emergency Medicine

## 2006-12-23 ENCOUNTER — Encounter (INDEPENDENT_AMBULATORY_CARE_PROVIDER_SITE_OTHER): Payer: Self-pay | Admitting: *Deleted

## 2009-10-22 ENCOUNTER — Emergency Department (HOSPITAL_COMMUNITY): Admission: EM | Admit: 2009-10-22 | Discharge: 2009-10-22 | Payer: Self-pay | Admitting: Emergency Medicine

## 2010-07-25 ENCOUNTER — Emergency Department (HOSPITAL_BASED_OUTPATIENT_CLINIC_OR_DEPARTMENT_OTHER)
Admission: EM | Admit: 2010-07-25 | Discharge: 2010-07-25 | Payer: Self-pay | Source: Home / Self Care | Admitting: Emergency Medicine

## 2011-03-12 NOTE — Discharge Summary (Signed)
Katie Merritt, Katie Merritt               ACCOUNT NO.:  [id number]   MEDICAL RECORD NO.:  [id number]          PATIENT TYPE:  INP   LOCATION:  9155                          FACILITY:  [facility]   PHYSICIAN:  Hal Morales, M.D.DATE OF BIRTH:  08-17-1971   DATE OF ADMISSION:  11/09/2004  DATE OF DISCHARGE:  11/11/2004                                 DISCHARGE SUMMARY   ADMISSION DIAGNOSIS:  Intrauterine pregnancy at 27-2/7 weeks, ?incompetent  cervix.  Cervical shortening on ultrasound.  Questionable rupture of  membranes.   DISCHARGE DIAGNOSES:  Intrauterine pregnancy at 27-4/7 weeks.  No evidence  of rupture of membranes.  Cervical shortening on ultrasound.  Status post  two doses of betamethasone.  Chronic hypertension controlled with [place].   HISTORY OF PRESENT ILLNESS:  Ms. Katie Merritt is a 40 year old gravida 5 para 3-0-  1-3 who presented at 27-2/7 weeks with questionable rupture of membranes.  Sterile speculum exam found negative pooling, negative Nitrazine, negative  fern.  Wet prep identified bacterial vaginosis.  Ultrasound exam showed AFI  10.7 cm, within normal limits, a single intrauterine gestation at 27-2/7  weeks with shortened cervix of 1.7 cm with funneling of internal os noted.   LABORATORY DATA:  GC and chlamydia done on 11/09/04 are negative.  Urine  specimen on 1/16 was significant for a large amount of leukocytes.  Wet prep  did identify bacterial vaginosis.  The patient's vital signs remained stable  throughout her admission.  Fetal heart rate has remained reactive and  reassuring with a baseline at 140s with average long-term variability and  reactivity is present.  The patient is contracting very sporadically on toco  dynameter with no regular contractions present.  Sterile speculum exam done  today on 11/11/04 finds a small amount of yellow discharge present.  Fetal  fibronectin was obtained.  Digital exam of the cervix finds it to be 1 cm,  dilated and 50% effaced with  the presenting part high in the pelvis which is  unchanged from previous exam.  Extremities show no edema, no calf  tenderness.  DTRs are 1+ with no clonus.  In light of the fact that the  patient has remained stable throughout her admission, she has received two  doses of betamethasone and her cervix has remained unchanged with no  contractions and no evidence of rupture of membranes.  She is deemed to be  in satisfactory condition for discharge.  She is to be discharged home per  Dr. Stefano Gaul.  She is to call this afternoon for fetal fibronectin results  and to keep her next scheduled appointment at [facility].  A prescription was  provided for metronidazole 500 mg p.o. b.i.d. for 7 days.  She will continue  this medication until its completion and call for any signs or symptoms of  pre-term labor, rupture of membranes, decreased fetal movement, or any  problems or concerns.      SDM/MEDQ  D:  11/11/2004  T:  11/11/2004  Job:  [id number]

## 2011-03-12 NOTE — H&P (Signed)
[name], [name]:  [id number]   MEDICAL RECORD NO.:  [id number]          PATIENT TYPE:  MAT   LOCATION:  [facility]                          FACILITY:  [facility]   PHYSICIAN:  Osborn Coho, M.D.   DATE OF BIRTH:  Dec 16, 1970   DATE OF ADMISSION:  11/18/2004  DATE OF DISCHARGE:                                HISTORY & PHYSICAL   HISTORY OF PRESENT ILLNESS:  Ms. Coste is a 40 year old gravida 5 para 3-0-  1-3 who presents at 45 and three-sevenths weeks; EDD February 07, 2005.  She  presents following a large gush of blood this a.m. in the toilet upon waking  at approximately 7 a.m.  She reports positive fetal movement, no rupture of  membranes, and the bleeding has subsided to light spotting with wiping since  this initial bleeding episode.  No contractions are noted by the patient  prior to the bleeding and none since.  She has had no recent intercourse.  She was seen in the office of [facility] on November 16, 2004.  A fetal fibronectin  was done and is positive.  She as admitted to [facility]  November 09, 2004 to November 11, 2004 with preterm cervical changes.  She was  admitted for magnesium sulfate and betamethasone which she received on  November 09, 2004 and November 10, 2004.  She was discharged on January 18 in  stable condition and has been at home on bedrest since that time.  She  reports no PIH symptoms. No headache, visual changes, or epigastric pain.  Her pregnancy has been followed by the M.D. service at [facility] and is  remarkable for:  1.  First trimester bleeding.  2.  Second trimester cervical change with a positive fetal fibronectin.  3.  Status post betamethasone x2 doses November 09, 2004 and November 10, 2004.  4.  Chronic hypertension, on medication.  5.  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  6.  Depression.  7.  History of abnormal Pap smear and LEEP.  8.  Smoker.  9.  Positive fetal fibronectin on November 16, 2004 and on November 09, 2004.      GC and chlamydia negative and group B strep negative.  The patient's      blood type is O positive.   This patient was initially evaluated at the office of [facility] on July 08, 2004 at approximately [redacted] weeks gestation.  EDC determined by dates and  confirmed with pregnancy ultrasound.   PRENATAL LABORATORY WORK:  On July 08, 2004 hemoglobin and hematocrit  12.9 and 36.2; platelets 229,000.  Blood type and Rh O positive, antibody  screen negative.  Sickle cell trait negative.  VDRL nonreactive.  Rubella  immune.  Hepatitis B surface antigen negative.  HIV nonreactive.  GC and  chlamydia negative.  CF testing negative.  Quad screen within normal limits.   OBSTETRICAL HISTORY:  In 1992 the patient had a normal spontaneous vaginal  delivery at term with the birth of a 6-pound female infant named Lauren by  Dr. Gaynell Face  with no complications.  In 1993 the patient had a 4-pound 6-  ounce female infant at 37 weeks vaginally.  The patient had no prenatal care  and that baby was born with Corneila de Lange syndrome.  His name in [place].  In 1998 the patient had a normal spontaneous vaginal delivery at 39 weeks  with the birth of a 5-pound 12-ounce female infant named Mya by Dr. Gaynell Face  with no complications.  In 2002 the patient had a first trimester SAB with a  D&C from Dr. Ashley Royalty with no complications, and the present pregnancy.   PAST MEDICAL HISTORY:  Significant for:  1.  History of abnormal Pap smear and LEEP in 1999.  2.  History of chlamydia in 1992 which was treated.  3.  Childhood varicella.  4.  History of chronic hypertension since 1999.  5.  The patient with a history of migraines but none currently.  6.  She is a cigarette smoker.   FAMILY HISTORY:  Remarkable for grandparents with hypertension and mother  with hypertension.  Father IV drug abuse and AIDS, and aunt with manic  depression.   PAST SURGICAL HISTORY:  Wisdom teeth and LEEP.   GENETIC  HISTORY:  Significant for her second child with Cornelia de Lange  syndrome.   SOCIAL HISTORY:  The patient is single.  Father of the baby, Inocencio Homes,  is involved and supportive.  She is of the [place] faith.  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   REVIEW OF SYSTEMS:  Is as described above.  The patient is typical of one  with a uterine pregnancy at 28 and three-sevenths weeks with third trimester  bleeding.  She is not contracting at the present time.   PHYSICAL EXAMINATION:  VITAL SIGNS:  Stable, afebrile.  HEENT:  Unremarkable.  HEART:  Regular rate and rhythm.  LUNGS:  Clear.  ABDOMEN:  Gravid in its contour.  Uterine fundus is noted to extend 28 cm  above the level of the pubic symphysis.  Leopold's maneuvers finds the  infant to be in a longitudinal lie, cephalic presentation, and the estimated  fetal weight is 1139 g.  On ultrasound, AFI is 11.5.  Placenta is anterior,  grade 1, with no previa.  A small amount of a small clot noted at the  internal os with no marginal hematoma seen.  Cervical exam per ultrasound  finds the cervix to be 1.9 cm in length transvaginally and shortens with  fundal pressure.  She had a positive fetal fibronectin on November 16, 2004  and on November 09, 2004 had GC and chlamydia negative and group B strep  negative.  The baby's heart rate is 130-140 with average long-term  variability.  Reactivity is present with no periodic changes.  Toco  initially showed some uterine irritability and that irritability has now  ceased and the patient is no long contracting.  PELVIC:  Digital exam of the cervix finds it to be closed, approximately 70%  effaced, with no active bleeding.  A small amount of brown discharge noted  on exam glove.  EXTREMITIES:  Show no pathologic edema and no calf tenderness.  DTRs are 1+  with no  clonus.  ASSESSMENT:  1.  Intrauterine pregnancy at 28 and three-sevenths weeks.  2.  Third trimester bleeding with positive fetal fibronectin.   PLAN:  Admit  per Dr. Osborn Coho for 23-hour observation with orders as  written.      SDM/MEDQ  D:  11/18/2004  T:  11/18/2004  Job:  [id number]

## 2011-03-12 NOTE — H&P (Signed)
Katie Merritt, Katie Merritt               ACCOUNT NO.:  [id number]   MEDICAL RECORD NO.:  [id number]          PATIENT TYPE:  INP   LOCATION:  9155                          [facility]ACILITY:  [facility]   PHYSICIAN:  Hal Morales, M.D.DATE OF BIRTH:  Apr 12, 1971   DATE OF ADMISSION:  11/09/2004  DATE OF DISCHARGE:                                HISTORY & PHYSICAL   HISTORY OF PRESENT ILLNESS:  This is a 40 year old gravida 5, para 3-0-1-3  at 27-2/7 weeks who presents with complaint of gushing fluid at 10 p.m.  No  further leaking since.  No contractions or bleeding are reported.  She has  no history of premature labor. Pregnancy has been remarkable for first  trimester bleeding, chronic hypertension, child with Cornelia-DeLang  syndrome, depression, history of LEEP in 1999, and smoker.   OB HISTORY:  1.  Remarkable for vaginal delivery in 1992 of female infant at [redacted] weeks      gestation, weighing 6 pounds.  No complications.  2.  Vaginal delivery in 1993 of a female infant, [redacted] weeks gestation, weighing      4 pounds 6 ounces, remarkable for Cornelia-DeLang syndrome.  3.  She had a vaginal delivery in 1998 of a female infant at [redacted] weeks      gestation weighing 5 pounds 12 ounces with no complications.  4.  Spontaneous abortion in 2002 with a D&C.   PAST MEDICAL HISTORY:  1.  History of abnormal Pap smear with LEEP in 1999.  2.  History of Chlamydia in 1992 which was treated.  3.  Childhood varicella.  4.  History of chronic hypertension since 1999.  5.  Past history of migraines but none currently.  6.  She also smokes cigarettes.   FAMILY HISTORY:  Remarkable for grandparents with hypertension and mother  with hypertension.  Father with IV drug abuse and AIDS. An aunt with manic  depression.   PAST SURGICAL HISTORY:  1.  Wisdom teeth.  2.  LEEP.   GENETIC HISTORY:  Significant for her second baby with Cornelia-DeLang  syndrome.   SOCIAL HISTORY:  The patient is single.  Father of the baby,  Inocencio Homes,  is involved and supportive.  She is of the [place] faith.  She works in  Pharmacologist. She denies any alcohol or drug abuse and does smoke cigarettes,  one pack per week.   PRENATAL LAB DATA:  Hemoglobin 12.9, platelets 229, blood type O positive,  antibody screen negative, sickle cell negative, RPR nonreactive, Rubella  immune, hepatitis negative, HIV negative, gonorrhea negative, Chlamydia  negative, cystic fibrosis negative.   PHYSICAL EXAMINATION:  VITAL SIGNS: Stable, afebrile.  HEENT:  Within normal limits.  NECK:  Thyroid normal, not enlarged.  CHEST:  Clear to auscultation.  HEART:  Regular rate and rhythm.  ABDOMEN:  Gravid, 27 weeks, 27 cm, vertex [place].  EFM shows reassuring  fetal heart rate with no uterine contractions.  PELVIS:  Sterile speculum exam shows negative pooling, negative nitrazine,  negative ferning with a creamy white discharge.  Cervix is 1 cm external os,  funneling at 60-70%  effaced and the vertex is high.  Wet prep shows moderate  clue cells and many white blood cells.  Urinalysis shows many bacteria and a  large leukocyte esterase and was sent to culture.  Ultrasound was done  showing cephalic presentation.  Placenta which is anterior.  AFI of 10.7,  cervix 1.7 cm long with internal os 2.3 cm dilated. The baby's left renal  pelvis is dilated to 7.7 mm which is a new finding and right renal pelvis is  dilated to 2.9 mm. Cervix at 18 weeks was 4.4 cm long.  EXTREMITIES:  Within normal limits.   ASSESSMENT:  1.  Intrauterine pregnancy at 27-2/7 weeks.  2.  Preterm cervical changes.  3.  No evidence of ruptured membranes.   PLAN:  1.  Discuss with Dr. Pennie Rushing.  We will admit her for 23-hour observation.  2.  Betamethasone, two doses.  3.  Fetal fibrinectin  prior to discharge.  4.  Further orders to follow.     Mari   MLW/MEDQ  D:  11/10/2004  T:  11/10/2004  Job:  [id number]

## 2011-03-12 NOTE — H&P (Signed)
[facility]  Patient:    Katie Merritt, Katie Merritt Visit Number: [id number] MRN: [id number]          Service Type: DSU Location: [facility] Attending Physician:  Wandalee Ferdinand Dictated by:   Rudy Jew Ashley Royalty, M.D. Admit Date:  10/31/2001 Discharge Date: 10/31/2001                           History and Physical  HISTORY OF PRESENT ILLNESS:   This is a 40 year old, gravida 4, para 3 at approximately 12-1/[redacted] weeks gestation. The patient presented on October 23, 2001, for prenatal care. She had an ultrasound at [facility] in the recent past which was suggestive of a 10-week gestation. However, the ultrasonographer at that time said the baby appeared to be abnormally small for the size of the uterus. Hence, she was asked to return for another ultrasound, October 27, 2001. At this time, the ultrasound revealed a nonviable intrauterine gestation. The patient is admitted for suction dilatation and curettage for missed AB.  MEDICATIONS:                  Vitamins.  PAST MEDICAL HISTORY:         Hypertension.  PAST SURGICAL HISTORY:        Negative.  ALLERGIES:                    Negative.  FAMILY HISTORY:               Positive for hypertension.  SOCIAL HISTORY:               The patient smokes 2 cigarettes per day. Alcohol social only.  REVIEW OF SYSTEMS:            Noncontributory.  PHYSICAL EXAMINATION:  GENERAL:                      Well-developed, well-nourished pleasant black female in no acute distress.  VITAL SIGNS:                  Afebrile.  Vital signs stable.  SKIN:                         Warm and dry without lesions.  LYMPH NODES:                  There is no supraclavicular, cervical or inguinal adenopathy.  HEENT:                        Normocephalic.  NECK:                         Supple without thyromegaly.  CHEST:                        Clear.  CARDIAC:                      Regular rate and rhythm  without murmurs, gallops or rubs.  ABDOMEN:                      Soft, nontender without masses or organomegaly. Bowel sounds are active.  MUSCULOSKELETAL:              Examination reveals full range  of motion without edema, cyanosis, or CVA tenderness.  PELVIC:                       Examination deferred until examination under anesthesia.  IMPRESSION:                   1. Intrauterine pregnancy, at approximately [redacted]                                  weeks gestation.                               2. Missed abortion.                               3. Chronic hypertension.  PLAN:                         Suction dilatation curettage. Risks, benefits, complications and alternatives fully discussed with the patient.  Blood type is 0 positive. Questions are invited and answered. Dictated by:   Rudy Jew Ashley Royalty, M.D. Attending Physician:  Wandalee Ferdinand DD:  10/31/01 TD:  10/31/01 Job: [id number]

## 2011-03-12 NOTE — Discharge Summary (Signed)
[name], Katie Merritt               ACCOUNT NO.:  [id number]   MEDICAL RECORD NO.:  [id number]          PATIENT TYPE:  INP   LOCATION:  9168                          FACILITY:  [facility]   PHYSICIAN:  Naima A. Dillard, M.D. DATE OF BIRTH:  28-Apr-1971   DATE OF ADMISSION:  11/18/2004  DATE OF DISCHARGE:                                 DISCHARGE SUMMARY   ADMISSION DIAGNOSES:  1.  Intrauterine pregnancy at 28 and three-sevenths weeks.  2.  Third trimester bleeding with positive fetal fibronectin.   DISCHARGE DIAGNOSES:  1.  Intrauterine pregnancy at 25 and five-sevenths weeks.  2.  Third trimester bleeding.  3.  Positive fetal fibronectin.  4.  Migraine headaches, chronic.   HOSPITAL PROCEDURES:  1.  Ultrasound.  2.  Electronic fetal monitoring.   HOSPITAL COURSE:  The patient was admitted following third trimester  bleeding which she reported a large gush of blood at home which slowed to  spotting.  She was admitted for monitoring and she had previously had a  positive fetal fibronectin on January 23.  She did receive betamethasone x2  doses on January 16 and January 17.  On admission her blood pressure was  normal, blood type is O positive.  Fetal heart rate was reactive and  reassuring.  She had some uterine irritability upon admission but that later  resolved.  Her cervix was closed and 70% thinned out.  There was no active  bleeding during the hospital stay other than a small amount of brown  discharge seen.  Ultrasound was done showing fetal growth of 50-75%,  anterior placenta, with no previa, AFI 11.5, cephalic presentation, and a  cervix which was 1.9 cm long which shortened with fundal pressure.  She was  admitted for observation.  On hospital day #2 she was doing well except for  the development of a migraine headache which was not relieved by Tylenol No.  3.  She was given Tylox which did resolve the headache.  She had one small  episode of bright red bleeding at 7 a.m. on  January 26 but no further  bleeding after that.  Fetal heart rate remained reactive throughout with no  contractions.  On the morning of January 27 she was doing well, vital signs  were stable, headache was gone, fetal heart rate was reactive, there were no  contractions and no bleeding, and she was deemed to have received the full  benefit of her hospital stay and was discharged home.   DISCHARGE MEDICATIONS:  1.  Tylox one to two p.o. q.4h. p.r.n. #20 no refills.  2.  Prenatal vitamins.   DISCHARGE LABORATORY DATA:  White blood cell count 16.3, hemoglobin 12.1,  platelets 209.  Blood type O positive.   DISCHARGE INSTRUCTIONS:  To include pelvic rest and modified bedrest.   DISCHARGE FOLLOW-UP:  In 1 week at [facility] or p.r.n.      MLW/MEDQ  D:  11/20/2004  T:  11/20/2004  Job:  [id number]

## 2011-03-12 NOTE — Op Note (Signed)
[facility]  Patient:    Katie Merritt, Katie Merritt Visit Number: [id number] MRN: [id number]          Service Type: DSU Location: [facility] Attending Physician:  Wandalee Ferdinand Dictated by:   Rudy Jew Ashley Royalty, M.D. Admit Date:  10/31/2001                             Operative Report  PREOPERATIVE DIAGNOSIS:       1. Intrauterine pregnancy at approximately                                  [redacted] weeks gestation.                               2. Missed abortion.  POSTOPERATIVE DIAGNOSIS:      1. Intrauterine pregnancy at approximately                                  [redacted] weeks gestation.                               2. Missed abortion; pathology pending.  OPERATION:                    Suction and dilatation curettage.  SURGEON:                      Rudy Jew. Ashley Royalty, M.D.  ANESTHESIA:                   Monitored anesthesia care with 1% Xylocaine                               paracervical block (10 cc).  FINDINGS:                     Moderate amount of apparent products of conception was obtained.  Uterus sounded to approximately 10 cm.  ESTIMATED BLOOD LOSS:         50 cc.  COMPLICATIONS:                None.  PACKS AND DRAINS:             None.  DESCRIPTION OF PROCEDURE:     The patient was taken to the operating room and placed in the dorsal supine position, but after IV sedation was administered, she was placed in the lithotomy position and prepped and draped in the usual manner for vaginal surgery.  Posterior weighted retractor was placed per vagina.  The anterior lip of the cervix grasped with a single-tooth tenaculum.  The uterus was gently sounded to approximately 10 cm.  The cervix was then serially dilated to a size 31-French using Pratt dilators.  A 10 mm suction curet was then introduced into the uterine cavity.  Suction was applied.  A moderate amount of apparent products of conception was delivered through the _____ .  After several  passes with the suction curet, no additional tissue was obtained.  At this point the vaginal instruments were removed.  Hemostasis was noted and the procedure was terminated.  The patient was taken to the recovery room in excellent condition.  Blood type was 0 positive. Dictated by:   Rudy Jew Ashley Royalty, M.D. Attending Physician:  Wandalee Ferdinand DD:  10/31/01 TD:  10/31/01 Job: [id number]

## 2011-03-12 NOTE — H&P (Signed)
NAMEJORDANNA, Katie Merritt               ACCOUNT NO.:  [id number]   MEDICAL RECORD NO.:  [id number]          PATIENT TYPE:  INP   LOCATION:  9161                          FACILITY:  [facility]   PHYSICIAN:  Hal Morales, M.D.DATE OF BIRTH:  1971-10-06   DATE OF ADMISSION:  11/23/2004  DATE OF DISCHARGE:                                HISTORY & PHYSICAL   This is a 40 year old gravida 5, para 3-0-1-3, who presents at 29-1/7 weeks  with a recurrence of bright red vaginal bleeding.  She was discharged from  the hospital on November 20, 2004 after a similar episode which resolved  after one to two days.  She reports positive fetal movement, denies any  leaking.  She does report occasional mild contractions.  Fetal fibronectin  was positive on November 16, 2004.  She had been admitted on November 09, 2004  with preterm cervical changes and received magnesium sulfate and  betamethasone x2 doses.  She was discharged on November 11, 2004 in stable  condition and has been at home on bed rest until her admission last week.  She  denies any PIH symptoms.  Pregnancy has been followed by the M.D.  service and remarkable for:  1.  First trimester bleeding.  2.  Second trimester cervical change with positive fetal fibronectin.  3.  Status post betamethasone x2 doses on November 09, 2004.  4.  Chronic hypertension on medication.  5.  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  6.  Depression.  7.  History of abnormal Pap smear and LEEP.  8.  Smoker.  9.  Group B strep negative, GC and Chlamydia negative and blood type O+.   HISTORY OF CURRENT PREGNANCY:  The patient was entered in care at nine weeks  gestation.  The EDC was determined by pregnancy ultrasound.  She developed  second trimester bleeding and preterm labor and had an episode of bleeding  again on November 16, 2004 and presents today with a recurrence.   PRENATAL LABORATORIES:  Hemoglobin 12.9, platelets 229,000, blood type O+,  antibody screen  negative, Sickle Cell negative.  RPR nonreactive.  Rubella  immune.  Hepatitis negative.  HIV negative.  GC and Chlamydia negative.  Cystic fibrosis negative.  Quad screen normal.   OBSTERIC HISTORY:  Remarkable for vaginal delivery at term in 1992 of a 6  pound female infant by Dr. Gaynell Face with no complications.  In 1993, she had  a 27 week female infant weighing 4 pounds 6 ounces, remarkable for no prenatal  care and the baby having Cornelia de Lange syndrome.  In 1998, she had a  vaginal delivery at 39 weeks of a female infant weighing 5 pounds 12 ounces  by Dr. Gaynell Face with no complications.  In 2002, she had her first trimester  spontaneous abortion with a D&C from Dr. Ashley Royalty with no complications.   PAST MEDICAL HISTORY:  Remarkable for a history of abnormal Pap smear with  LEEP in 1999, history of Chlamydia in 1992 which was treated, history of  childhood Varicella, history of chronic hypertension since 1999, history of  migraines with  no current exacerbations and history of cigarette smoking.   FAMILY HISTORY:  Remarkable for grandparents with hypertension and mother  with hypertension and father with IV drug abuse and AIDS and an aunt with  manic depression.   SURGICAL HISTORY:  Remarkable for wisdom teeth and LEEP.   GENETIC HISTORY:  Remarkable for her second child who has Cornelia de Lange  syndrome.   SOCIAL HISTORY:  The patient is single.  The father of the baby, Inocencio Homes is involved and supportive.  She is of the [place] faith.  She  works in home health and denies any use of alcohol or drugs but does admit  to smoking one pack of cigarettes per week.   OBJECTIVE DATA:  VITAL SIGNS:  Vital signs stable.  Afebrile.  HEENT:  Within normal limits.  NECK:  Thyroid normal, not enlarged.  CHEST:  Clear to auscultation.  HEART:  A regular rate and rhythm.  ABDOMEN:  Gravid with a fundal height of 29 cm.  Vertex De Leopold's.  Previous ultrasound showed an  anterior grade I placenta with no previa.  Sterile speculum exam shows moderate amount of bright red blood.  The cervix  is fingertip to 1 cm, 70% effaced, -3 station with a vertex presentation.  Fetal heart rate is reactive with positive accelerations and no  decelerations.  Uterine contractions occur every 8-9 minutes.  EXTREMITIES:  Within normal limits.   ASSESSMENT:  1.  Intra-uterine pregnancy at 29-1/7 weeks.  2.  Third trimester bleeding with positive fetal fibronectin and preterm      labor.   PLAN:  1.  Admit to birthing suite per Dr. Pennie Rushing.  2.  Bed rest.  3.  M.D. to follow.      MLW/MEDQ  D:  11/23/2004  T:  11/23/2004  Job:  [id number]

## 2011-03-12 NOTE — Discharge Summary (Signed)
Katie Merritt, Katie Merritt               ACCOUNT NO.:  [id number]   MEDICAL RECORD NO.:  [id number]          PATIENT TYPE:  INP   LOCATION:  9318                          [facility]ACILITY:  [facility]   PHYSICIAN:  Hal Morales, M.D.DATE OF BIRTH:  Mar 07, 1971   DATE OF ADMISSION:  11/23/2004  DATE OF DISCHARGE:                                 DISCHARGE SUMMARY   ADMISSION DIAGNOSES:  1.  Intrauterine pregnancy at 29 and one-seventh weeks.  2.  Third trimester bleeding.  3.  Premature labor with positive fetal fibronectin.   DISCHARGE DIAGNOSES:  1.  Intrauterine pregnancy at 29 and one-seventh weeks.  2.  Third trimester bleeding.  3.  Premature labor with positive fetal fibronectin.  4.  Chronic abruption.  5.  Preterm spontaneous vaginal delivery of a live-born female infant, Apgars      5 and 9.  6.  Pregnancy-induced hypertension.   HOSPITAL PROCEDURES:  1.  Magnesium sulfate tocolysis.  2.  Electronic fetal monitoring.  3.  Spontaneous vaginal delivery.   HOSPITAL COURSE:  The patient was admitted at 68 and one-seventh weeks with  third trimester bleeding, positive fetal fibronectin, and preterm labor.  She was placed on magnesium sulfate for tocolysis.  Her contractions slowed  with magnesium sulfate and she was observed over the next few days.  On  November 26, 2004 her cervix was 1 cm, 80% effaced, and -2 station.  She had  moderate amount of vaginal bleeding.  Magnesium sulfate was continued, as  was bedrest.  She was transferred to labor and delivery on November 28, 2004  when her uterine contractions resumed to every 3 minutes.  Her cervix  changed to 3-4 cm, 90% effaced, and a 0 station with moderate active  bleeding.  She continued to labor throughout the day and delivered later  that evening with minimal pushing.  Neonatologist was present for delivery.  The baby's Apgars were 5 and 9.  There was a small clot at the leading edge  of the placenta.  EBL was 300 mL.  The patient was  taken to the mother-baby  floor and the baby was taken to the NICU.  The patient's blood pressure was  elevated after delivery and Aldomet was restarted.  Arterial cord blood gas  was 7.27.  On postpartum day #1, blood pressures were 133/97 on Aldomet.  The patient was requesting an elective tubal ligation.  Liver function tests  were repeated secondary to hypertension.  Her platelets were 192 and her  liver function tests were within normal limits.  On postpartum day #2 she  had an elective bilateral tubal ligation done by Dr. Estanislado Pandy with no  complications.  Vital signs were stable.  Fundus was firm, lochia was small,  extremities within normal limits.  The patient was deemed to have received  the full benefit of her hospital stay and was discharged to home in the  evening of her postpartum day #2.   DISCHARGE MEDICATIONS:  1.  Motrin 600 mg p.o. q.6h. p.r.n.  2.  Tylox one to two p.o. q.4h. p.r.n.  3.  Aldomet  250 mg b.i.d.   DISCHARGE LABORATORY DATA:  White blood cell count 10.8, hemoglobin 11.3,  platelets 192.  ALT 27, AST 15.   DISCHARGE INSTRUCTIONS:  Per [facility] handout.   DISCHARGE FOLLOW-UP:  Will include blood pressure check this week per home  health nurse and then in 6 weeks or sooner as indicated.   CONDITION AT DISCHARGE:  Good.      MLW/MEDQ  D:  11/30/2004  T:  11/30/2004  Job:  [id number]

## 2011-03-12 NOTE — Op Note (Signed)
Katie Merritt, SCHWENN NO.:  [id number]   MEDICAL RECORD NO.:  [id number]           PATIENT TYPE:   LOCATION:                                 FACILITY:   PHYSICIAN:  Crist Fat. Rivard, M.D.      DATE OF BIRTH:   DATE OF PROCEDURE:  DATE OF DISCHARGE:                                 OPERATIVE REPORT   PREOPERATIVE DIAGNOSIS:  Desire for sterilization.   POSTOPERATIVE DIAGNOSIS:  Desire for sterilization.   ANESTHESIA:  General.   PROCEDURE:  Postpartum bilateral tubal ligation.   SURGEON:  Crist Fat. Rivard, M.D.   ESTIMATED BLOOD LOSS:  Minimal.   PROCEDURE IN DETAIL:  After being informed of the planned procedure with  possible complications including bleeding, infection, injury to organs,  irreversibility, and failure rate of 1/500, informed consent is obtained.  The patient is taken to OR #8, given general anesthesia with laryngeal mask  without any complication.  She is placed in the dorsal decubitus position,  prepped and draped in the sterile fashion.  We proceed with infiltration of  the umbilical area using 10 mL of Marcaine 0.25 and performed a semi-  elliptical incision with knife which is brought down to the fascia.  The  fascia is grasped with Kocher forceps and incised first with knife and then  with scissors.  The peritoneum is entered bluntly.  We are able to locate  both tubes with Babcock forceps and exteriorized both tubes until we  visualized the fimbriae.  The mesosalpinx was then entered with cautery and  we then doubly ligate the proximal and doubly ligate the distal stump with 0  chromic.  A section of 1.5 cm of tube in the isthmal ampullary region is  removed and both stumps are cauterized.  Hemostasis is adequate.  We then  closed the fascia with a running suture of 0 Vicryl and skin with a  subcuticular suture of 4-0 Monocryl and Steri-Strips.  Instrument and sponge  counts are  complete x2.  Estimated blood loss is minimal.   Procedure is  well tolerated by the patient who is taken to recovery room in a well and  stable condition.      SAR/MEDQ  D:  11/30/2004  T:  11/30/2004  Job:  [id number]

## 2012-11-06 ENCOUNTER — Telehealth: Payer: Self-pay | Admitting: Obstetrics and Gynecology

## 2012-11-24 ENCOUNTER — Encounter: Payer: Self-pay | Admitting: Obstetrics and Gynecology

## 2014-04-01 ENCOUNTER — Other Ambulatory Visit: Payer: Self-pay | Admitting: Pain Medicine

## 2014-04-01 DIAGNOSIS — M545 Low back pain, unspecified: Secondary | ICD-10-CM

## 2014-04-02 ENCOUNTER — Other Ambulatory Visit: Payer: Self-pay

## 2014-04-06 ENCOUNTER — Inpatient Hospital Stay: Admission: RE | Admit: 2014-04-06 | Payer: Self-pay | Source: Ambulatory Visit

## 2015-01-22 ENCOUNTER — Telehealth: Payer: Self-pay | Admitting: *Deleted

## 2015-01-22 NOTE — Telephone Encounter (Signed)
Unable to reach patient at time of Pre-Visit Call.  Left message for patient to return call when available.    

## 2015-01-23 ENCOUNTER — Ambulatory Visit (INDEPENDENT_AMBULATORY_CARE_PROVIDER_SITE_OTHER): Payer: BLUE CROSS/BLUE SHIELD | Admitting: Medical

## 2015-01-23 ENCOUNTER — Encounter: Payer: Self-pay | Admitting: Medical

## 2015-01-23 VITALS — BP 138/95 | HR 90 | Temp 98.2°F | Ht 66.75 in | Wt 190.0 lb

## 2015-01-23 DIAGNOSIS — E785 Hyperlipidemia, unspecified: Secondary | ICD-10-CM | POA: Diagnosis not present

## 2015-01-23 DIAGNOSIS — F411 Generalized anxiety disorder: Secondary | ICD-10-CM | POA: Diagnosis not present

## 2015-01-23 DIAGNOSIS — I10 Essential (primary) hypertension: Secondary | ICD-10-CM | POA: Diagnosis not present

## 2015-01-23 DIAGNOSIS — R7989 Other specified abnormal findings of blood chemistry: Secondary | ICD-10-CM | POA: Diagnosis not present

## 2015-01-23 DIAGNOSIS — J441 Chronic obstructive pulmonary disease with (acute) exacerbation: Secondary | ICD-10-CM | POA: Insufficient documentation

## 2015-01-23 DIAGNOSIS — J449 Chronic obstructive pulmonary disease, unspecified: Secondary | ICD-10-CM

## 2015-01-23 MED ORDER — ALPRAZOLAM 1 MG PO TABS: ORAL_TABLET | ORAL | Status: DC

## 2015-01-23 NOTE — Progress Notes (Signed)
Pre visit review using our clinic review tool, if applicable. No additional management support is needed unless otherwise documented below in the visit note. 

## 2015-01-23 NOTE — Patient Instructions (Addendum)
Generalized anxiety disorder Continue xanax 1 mg 1/2 tab po bid anxiety. I want you to consider low dose SSRI for anxiety. And could consider switching you to medication such as buspar for anxiety.  May need to sign contract on next visit if you decide you want to continue the xanax. For severe anxiety could use whole tab.   HTN (hypertension) Htn- pt is on amlodipine and losartan.   Hyperlipidemia Hyperlipidemia- hx of and last checked in feb. Lipitor 80 mg q day.   Low serum vitamin D Recommend getting refill on rx. Will get level on follow up.   COPD, mild Pt minimizes and speculates she may not even need to use albuterol. I want her to watch for wheezing or sob. Use if needed a 6 hrs. If using frequently then may need steroid inhaler or copd specific inhaler.     Please sign release of info form so we can get old records.  Follow up in one month or as needd.

## 2015-01-23 NOTE — Progress Notes (Signed)
Subjective:    Patient ID: Katie Merritt, female    DOB: August 11, 1971, 44 y.o.   MRN: [id number]  HPI   I have reviewed pt PMH, PSH, FH, Social History and Surgical History.  Anxiety- Pt had this for past year. Pt states this is not daily. This occurs about 3 times a week. Often related to work.   Insomnia- pt takes otc meds such as benadryl to help her sleep. Maybe 3-4 times a week. This has been going on four  3-4 years. No exercise. 1 cup of coffee in am.   Copd per her prior doctor- Hx of smoking. Was smoking black and mild. Pt stopped smoking with zyban beginning of feb.Prior smoker 7-8 yrs. Pt uses albuterol may 2 puffs one time a day. Feels sob walking [place].   Hyperlipidemia- hx of and last checked in feb. Lipitor 80 mg q day.  Htn- pt is on amlodipine and losartan.   Low vitamin d- Pt was vitamin in past. She took meds briefly.  Dad deceased- HIV.   Work Nutritional therapist, no exercise, Poor diet, 3 children. Single.     Review of Systems  Constitutional: Negative for fever, chills and fatigue.  HENT: Negative for congestion, drooling and ear pain.   Respiratory: Negative for cough, chest tightness and wheezing.   Cardiovascular: Negative for chest pain and palpitations.  Musculoskeletal: Negative for back pain.  Skin: Negative for color change and rash.  Neurological: Negative for dizziness, seizures, syncope, weakness and headaches.  Hematological: Negative for adenopathy. Does not bruise/bleed easily.  Psychiatric/Behavioral: Negative for suicidal ideas, behavioral problems, confusion, sleep disturbance and dysphoric mood. The patient is nervous/anxious.    LMP- 1 wk ago.   Past Medical History  Diagnosis Date  . Anxiety   . COPD (chronic obstructive pulmonary disease)   . Hyperlipidemia   . Hypertension     History   Social History  . Marital Status: Single    Spouse Name: N/A  . Number of Children: N/A  . Years of Education: N/A    Occupational History  . Not on file.   Social History Main Topics  . Smoking status: Never Smoker   . Smokeless tobacco: Never Used  . Alcohol Use: 0.0 oz/week    0 Standard drinks or equivalent per week  . Drug Use: Not on file  . Sexual Activity: Not on file   Other Topics Concern  . Not on file   Social History Narrative  . No narrative on file    Past Surgical History  Procedure Laterality Date  . Tubal ligation    . Leap      Family History  Problem Relation Age of Onset  . Hyperlipidemia Mother   . Hypertension Mother     No Known Allergies  No current outpatient prescriptions on file prior to visit.   No current facility-administered medications on file prior to visit.    BP 138/95 mmHg  Pulse 90  Temp(Src) 98.2 F (36.8 C) (Oral)  Ht 5' 6.75" (1.695 m)  Wt 190 lb (86.183 kg)  BMI 30.00 kg/m2  SpO2 99%  LMP 01/16/2015       Objective:   Physical Exam  General Mental Status- Alert. General Appearance- Not in acute distress.   Skin General: Color- Normal Color. Moisture- Normal Moisture.  Neck Carotid Arteries- Normal color. Moisture- Normal Moisture. No carotid bruits. No JVD.  Chest and Lung Exam Auscultation: Breath Sounds:-Normal.  Cardiovascular Auscultation:Rythm- Regular. Murmurs &  Other Heart Sounds:Auscultation of the heart reveals- No Murmurs.  .  Neurologic Cranial Nerve exam:- CN III-XII intact(No nystagmus), symmetric smile. Drift Test:- No drift. Finger to Nose:- Normal/Intact Strength:- 5/5 equal and symmetric strength both upper and lower extremities.      Assessment & Plan:

## 2015-01-23 NOTE — Assessment & Plan Note (Signed)
Recommend getting refill on rx. Will get level on follow up.

## 2015-01-23 NOTE — Assessment & Plan Note (Signed)
Htn- pt is on amlodipine and losartan.

## 2015-01-23 NOTE — Assessment & Plan Note (Addendum)
Continue xanax 1 mg 1/2 tab po bid anxiety. I want you to consider low dose SSRI for anxiety. And could consider switching you to medication such as buspar for anxiety.  May need to sign contract on next visit if you decide you want to continue the xanax. For severe anxiety could use whole tab.

## 2015-01-23 NOTE — Assessment & Plan Note (Signed)
Pt minimizes and speculates she may not even need to use albuterol. I want her to watch for wheezing or sob. Use if needed a 6 hrs. If using frequently then may need steroid inhaler or copd specific inhaler.

## 2015-01-23 NOTE — Assessment & Plan Note (Deleted)
Use albuterol if needed. If needing on daily basis. Then may need steroid inhaler. Or copd specific med.

## 2015-01-23 NOTE — Assessment & Plan Note (Signed)
Hyperlipidemia- hx of and last checked in feb. Lipitor 80 mg q day.

## 2015-02-14 ENCOUNTER — Telehealth: Payer: Self-pay | Admitting: Medical

## 2015-02-14 NOTE — Telephone Encounter (Signed)
Left message for patient to call so she can be placed on schedule for Mon or Tue.

## 2015-02-14 NOTE — Telephone Encounter (Signed)
Caller name: Grady Lucci  Relation to pt: self  Call back number: [phone]   Reason for call:  pt requesting a refill ALPRAZolam (XANAX) 1 MG tablet

## 2015-02-17 NOTE — Telephone Encounter (Signed)
Pt scheduled appointment 02/18/2015 at 8am

## 2015-02-18 ENCOUNTER — Ambulatory Visit (INDEPENDENT_AMBULATORY_CARE_PROVIDER_SITE_OTHER): Payer: BLUE CROSS/BLUE SHIELD | Admitting: Medical

## 2015-02-18 ENCOUNTER — Encounter: Payer: Self-pay | Admitting: Medical

## 2015-02-18 VITALS — BP 135/85 | HR 82 | Temp 98.4°F | Ht 66.75 in | Wt 190.6 lb

## 2015-02-18 DIAGNOSIS — F411 Generalized anxiety disorder: Secondary | ICD-10-CM | POA: Diagnosis not present

## 2015-02-18 DIAGNOSIS — I10 Essential (primary) hypertension: Secondary | ICD-10-CM | POA: Diagnosis not present

## 2015-02-18 DIAGNOSIS — E785 Hyperlipidemia, unspecified: Secondary | ICD-10-CM | POA: Diagnosis not present

## 2015-02-18 MED ORDER — BUSPIRONE HCL 7.5 MG PO TABS: ORAL_TABLET | ORAL | Status: DC

## 2015-02-18 MED ORDER — SERTRALINE HCL 25 MG PO TABS: 25.0000 mg | ORAL_TABLET | Freq: Every day | ORAL | Status: DC

## 2015-02-18 NOTE — Progress Notes (Signed)
Subjective:    Patient ID: Katie Merritt, female    DOB: 06-Nov-1970, 44 y.o.   MRN: [id number]  HPI  Pt in for follow up. Pt does not check her bp. No machine. No cardiac or neurologic signs or symptoms.  Pt has hyperlipidemia. Her old office states they have not got release of info form. Last physical was in February. Pt states she has not been taking her cholesterol medications. Pt drinking water more. Some exercise.  Pt also in for follow up on her anxiety. Pt has been taking xanax 1-2 tabs a day. Pt in past has never been on any other medication for anxiety.   Pt no longer smoker.   Review of Systems  Constitutional: Negative for fever, chills, diaphoresis, activity change and fatigue.  Respiratory: Negative for cough, chest tightness and shortness of breath.   Cardiovascular: Negative for chest pain, palpitations and leg swelling.  Gastrointestinal: Negative for nausea, vomiting and abdominal pain.  Musculoskeletal: Negative for neck pain and neck stiffness.  Neurological: Negative for dizziness, tremors, seizures, syncope, facial asymmetry, speech difficulty, weakness, light-headedness, numbness and headaches.  Psychiatric/Behavioral: Negative for behavioral problems, confusion and agitation. The patient is nervous/anxious.      Past Medical History  Diagnosis Date  . Anxiety   . COPD (chronic obstructive pulmonary disease)   . Hyperlipidemia   . Hypertension     History   Social History  . Marital Status: Single    Spouse Name: N/A  . Number of Children: N/A  . Years of Education: N/A   Occupational History  . Not on file.   Social History Main Topics  . Smoking status: Never Smoker   . Smokeless tobacco: Never Used  . Alcohol Use: 0.0 oz/week    0 Standard drinks or equivalent per week  . Drug Use: Not on file  . Sexual Activity: Not on file   Other Topics Concern  . Not on file   Social History Narrative    Past Surgical History  Procedure  Laterality Date  . Tubal ligation    . Leap      Family History  Problem Relation Age of Onset  . Hyperlipidemia Mother   . Hypertension Mother     No Known Allergies  Current Outpatient Prescriptions on File Prior to Visit  Medication Sig Dispense Refill  . Albuterol Sulfate (PROAIR HFA IN) Inhale 2 puffs into the lungs. 2 puffs as needed    . ALPRAZolam (XANAX) 1 MG tablet 1/2 tablet-1 tablet  by mouth as needed 2 times daily. 30 tablet 0  . amLODipine (NORVASC) 5 MG tablet Take 5 mg by mouth daily.    Marland Kitchen atorvastatin (LIPITOR) 80 MG tablet Take 80 mg by mouth at bedtime.    . ergocalciferol (VITAMIN D2) 50000 UNITS capsule Take 50,000 Units by mouth once a week.    . losartan (COZAAR) 50 MG tablet Take 50 mg by mouth daily.     No current facility-administered medications on file prior to visit.    BP 135/85 mmHg  Pulse 82  Temp(Src) 98.4 F (36.9 C) (Oral)  Ht 5' 6.75" (1.695 m)  Wt 190 lb 9.6 oz (86.456 kg)  BMI 30.09 kg/m2  SpO2 98%  LMP 01/07/2015       Objective:   Physical Exam   General Mental Status- Alert. General Appearance- Not in acute distress.   Skin General: Color- Normal Color. Moisture- Normal Moisture.  Neck Carotid Arteries- Normal color. Moisture- Normal Moisture.  No carotid bruits. No JVD.  Chest and Lung Exam Auscultation: Breath Sounds:-Normal.  Cardiovascular Auscultation:Rythm- Regular. Murmurs & Other Heart Sounds:Auscultation of the heart reveals- No Murmurs.  Abdomen Inspection:-Inspeection Normal. Palpation/Percussion:Note:No mass. Palpation and Percussion of the abdomen reveal- Non Tender, Non Distended + BS, no rebound or guarding.    Neurologic Cranial Nerve exam:- CN III-XII intact(No nystagmus), symmetric smile. Strength:- 5/5 equal and symmetric strength both upper and lower extremities.     Assessment & Plan:

## 2015-02-18 NOTE — Assessment & Plan Note (Addendum)
Will rx sertraline and buspirone. Use Buspirone for next 2 wks. May rx buspirone in future if needed.  Stop xanax. Not to use while on bupsirone.

## 2015-02-18 NOTE — Progress Notes (Signed)
Pre visit review using our clinic review tool, if applicable. No additional management support is needed unless otherwise documented below in the visit note. 

## 2015-02-18 NOTE — Assessment & Plan Note (Signed)
Diet and exercise. Recheck lipid panel in early June. Try to get old records.

## 2015-02-18 NOTE — Patient Instructions (Signed)
HTN (hypertension) I will rx pt a bp cuff. So she can start to check. See if trend is controlled. Continue on current meds.   Hyperlipidemia Diet and exercise. Recheck lipid panel in early June. Try to get old records.   Generalized anxiety disorder Will rx sertraline and buspirone. Use Buspirone for next 2 wks. May rx buspirone in future if needed.  Stop xanax. Not to use while on bupsirone.     Follow up in 1-2 months or as needed. In June can get fasting lab/lipid panel.

## 2015-02-18 NOTE — Assessment & Plan Note (Signed)
I will rx pt a bp cuff. So she can start to check. See if trend is controlled. Continue on current meds.

## 2015-02-19 NOTE — Telephone Encounter (Signed)
Called patient,left message for call back so that we can schedule a follow up appointment on Monday or Tuesday of upcoming week.

## 2015-02-20 ENCOUNTER — Ambulatory Visit: Payer: BLUE CROSS/BLUE SHIELD | Admitting: Medical

## 2015-02-21 ENCOUNTER — Telehealth: Payer: Self-pay | Admitting: Medical

## 2015-02-21 ENCOUNTER — Encounter: Payer: Self-pay | Admitting: Medical

## 2015-02-21 MED ORDER — VENLAFAXINE HCL ER 75 MG PO CP24: 75.0000 mg | ORAL_CAPSULE | Freq: Every day | ORAL | Status: DC

## 2015-02-21 NOTE — Telephone Encounter (Signed)
Pt tell me that buspirone seemed not to help at all for anxiety. But then tried sertraline and feels that sertraline makes  Her feel tired and takes away any motivation. I advised her to stop sertraline.   I want there to take buspirone 2 tabs po bid(I started her on very low dose). I will call in effexor in place of sertraline. She will update me on Monday how she feels.

## 2015-02-21 NOTE — Telephone Encounter (Signed)
error:[id number] ° °

## 2015-02-27 ENCOUNTER — Ambulatory Visit: Payer: BLUE CROSS/BLUE SHIELD | Admitting: Medical

## 2015-03-10 ENCOUNTER — Other Ambulatory Visit: Payer: Self-pay | Admitting: Medical

## 2015-03-18 ENCOUNTER — Other Ambulatory Visit: Payer: Self-pay | Admitting: Medical

## 2015-04-08 ENCOUNTER — Telehealth: Payer: Self-pay | Admitting: Medical

## 2015-04-08 MED ORDER — BUSPIRONE HCL 7.5 MG PO TABS: ORAL_TABLET | ORAL | Status: DC

## 2015-04-08 NOTE — Telephone Encounter (Signed)
Relation to pt: self Call back number: [phone] Pharmacy: [id number] - [place], [place]  Reason for call:  Pt requesting a a refill busPIRone (BUSPAR) 7.5 MG tablet and venlafaxine XR (EFFEXOR-XR) 75 MG 24 hr capsule

## 2015-04-09 ENCOUNTER — Telehealth: Payer: Self-pay | Admitting: Medical

## 2015-04-09 NOTE — Telephone Encounter (Signed)
Patient states that the pharmacy did not receive effexor rx

## 2015-04-09 NOTE — Telephone Encounter (Signed)
Getting lpn to call and clarify on why our chart states effexor sent to pharmacy but they state never sent?

## 2015-05-05 ENCOUNTER — Ambulatory Visit (INDEPENDENT_AMBULATORY_CARE_PROVIDER_SITE_OTHER): Payer: BLUE CROSS/BLUE SHIELD | Admitting: Medical

## 2015-05-05 VITALS — BP 156/107 | HR 74 | Temp 98.4°F | Ht 66.75 in | Wt 188.6 lb

## 2015-05-05 DIAGNOSIS — F411 Generalized anxiety disorder: Secondary | ICD-10-CM

## 2015-05-05 DIAGNOSIS — I10 Essential (primary) hypertension: Secondary | ICD-10-CM | POA: Diagnosis not present

## 2015-05-05 MED ORDER — AMLODIPINE-OLMESARTAN 5-40 MG PO TABS: 1.0000 | ORAL_TABLET | Freq: Every day | ORAL | Status: DC

## 2015-05-05 MED ORDER — ALPRAZOLAM 1 MG PO TABS
ORAL_TABLET | ORAL | Status: DC
Start: 2015-05-05 — End: 2015-06-10

## 2015-05-05 MED ORDER — VENLAFAXINE HCL ER 150 MG PO CP24: 150.0000 mg | ORAL_CAPSULE | Freq: Every day | ORAL | Status: DC

## 2015-05-05 NOTE — Progress Notes (Signed)
Subjective:    Patient ID: Katie Merritt, female    DOB: 11-21-1970, 44 y.o.   MRN: [id number]  HPI  Pt in states she wants to take xanax. Pt did make effort to use buspirone. She wants to be on xanax. In past was taking it one time a day. She states every day moderate to high level chronic anxiety. Most of time related to work. Sometimes insomnia.  Pt states when she is checking her blood pressure it is always on the high side. She states her bp is always on high side. Though she can't give exact numbers. She thinks 150/100. Occasionally better. No cardiac or neurologic signs or symptoms.    Review of Systems  Constitutional: Negative for fever, chills, diaphoresis, activity change and fatigue.  Respiratory: Negative for cough, chest tightness and shortness of breath.   Cardiovascular: Negative for chest pain, palpitations and leg swelling.  Gastrointestinal: Negative for nausea, vomiting and abdominal pain.  Musculoskeletal: Negative for neck pain and neck stiffness.  Neurological: Negative for dizziness, tremors, seizures, syncope, facial asymmetry, speech difficulty, weakness, light-headedness, numbness and headaches.  Psychiatric/Behavioral: Negative for behavioral problems, confusion and agitation. The patient is nervous/anxious.      Past Medical History  Diagnosis Date  . Anxiety   . COPD (chronic obstructive pulmonary disease)   . Hyperlipidemia   . Hypertension     History   Social History  . Marital Status: Single    Spouse Name: N/A  . Number of Children: N/A  . Years of Education: N/A   Occupational History  . Not on file.   Social History Main Topics  . Smoking status: Never Smoker   . Smokeless tobacco: Never Used  . Alcohol Use: 0.0 oz/week    0 Standard drinks or equivalent per week  . Drug Use: Not on file  . Sexual Activity: Not on file   Other Topics Concern  . Not on file   Social History Narrative    Past Surgical History  Procedure  Laterality Date  . Tubal ligation    . Leap      Family History  Problem Relation Age of Onset  . Hyperlipidemia Mother   . Hypertension Mother     No Known Allergies  Current Outpatient Prescriptions on File Prior to Visit  Medication Sig Dispense Refill  . Albuterol Sulfate (PROAIR HFA IN) Inhale 2 puffs into the lungs. 2 puffs as needed    . amLODipine (NORVASC) 5 MG tablet Take 5 mg by mouth daily.    Marland Kitchen atorvastatin (LIPITOR) 80 MG tablet Take 80 mg by mouth at bedtime.    . busPIRone (BUSPAR) 7.5 MG tablet 1 tab po bid prn anxiety 30 tablet 1  . ergocalciferol (VITAMIN D2) 50000 UNITS capsule Take 50,000 Units by mouth once a week.    . losartan (COZAAR) 50 MG tablet Take 50 mg by mouth daily.    Marland Kitchen venlafaxine XR (EFFEXOR-XR) 75 MG 24 hr capsule TAKE 1 CAPSULE (75 MG TOTAL) BY MOUTH DAILY WITH BREAKFAST. 30 capsule 4  . ALPRAZolam (XANAX) 1 MG tablet 1/2 tablet-1 tablet  by mouth as needed 2 times daily. (Patient not taking: Reported on 05/05/2015) 30 tablet 0   No current facility-administered medications on file prior to visit.    BP 156/107 mmHg  Pulse 74  Temp(Src) 98.4 F (36.9 C) (Oral)  Ht 5' 6.75" (1.695 m)  Wt 188 lb 9.6 oz (85.548 kg)  BMI 29.78 kg/m2  SpO2 100%  LMP 05/05/2015       Objective:   Physical Exam  General Mental Status- Alert. General Appearance- Not in acute distress.   Skin General: Color- Normal Color. Moisture- Normal Moisture.  Chest and Lung Exam Auscultation: Breath Sounds:-Normal. CTA.  Cardiovascular Auscultation:Rythm- Regular. Murmurs & Other Heart Sounds:Auscultation of the heart reveals- No Murmurs.   Neurologic Cranial Nerve exam:- CN III-XII intact(No nystagmus), symmetric smile. Strength:- 5/5 equal and symmetric strength both upper and lower extremities.      Assessment & Plan:

## 2015-05-05 NOTE — Assessment & Plan Note (Signed)
Pt expresses desire to just be on one tablet of med for bp. On norvasc 5 mg and losartan 50 mg. Will dc both and rx azor 5/40.  Will see if this if bp controlled over next month. May need to use 10/40.

## 2015-05-05 NOTE — Assessment & Plan Note (Addendum)
Pt has made significant effort to use buspar rather than benzo. In addition has been using effexor. Despite this she still has moderate to high level of anxiety. I am going to rx xanax. Will sign controlled substance contract and get drug screen today.  Rx of xanax 1 mg tab. 1/2 tab po bid as needed anxiety. Rx effexor 150 mg 1 tab po q day

## 2015-05-05 NOTE — Patient Instructions (Addendum)
Generalized anxiety disorder Pt has made significant effort to use buspar rather than benzo. In addition has been using effexor. Despite this she still has moderate to high level of anxiety. I am going to rx xanax. Will sign controlled substance contract and get drug screen today.  Rx of xanax 1 mg tab. 1/2 tab po bid as needed anxiety. Rx effexor 150 mg 1 tab po q day  HTN (hypertension) Pt expresses desire to just be on one tablet of med for bp. On norvasc 5 mg and losartan 50 mg. Will dc both and rx azor 5/40.  Will see if this if bp controlled over next month. May need to use 10/40.     Follow up in 3 months or as needed

## 2015-05-05 NOTE — Progress Notes (Signed)
Pre visit review using our clinic review tool, if applicable. No additional management support is needed unless otherwise documented below in the visit note. 

## 2015-05-12 ENCOUNTER — Other Ambulatory Visit: Payer: Self-pay | Admitting: Medical

## 2015-06-09 ENCOUNTER — Telehealth: Payer: Self-pay | Admitting: Medical

## 2015-06-09 ENCOUNTER — Encounter: Payer: Self-pay | Admitting: Medical

## 2015-06-09 NOTE — Telephone Encounter (Addendum)
Caller name:Yadao Moya Relation to QI:XMDE Call back [phone] Pharmacy:  Reason for call: pt is needing rx   ALPRAZolam (XANAX) 1 MG tablet  Please call when available for pick      Needs to be seen in office first/

## 2015-06-09 NOTE — Telephone Encounter (Signed)
Would you let pt know she needs appointment with me to discuss drug screen report before I can prescribe any more xanax. Any questions before you call pt let me know.

## 2015-06-10 ENCOUNTER — Ambulatory Visit (INDEPENDENT_AMBULATORY_CARE_PROVIDER_SITE_OTHER): Payer: BLUE CROSS/BLUE SHIELD | Admitting: Medical

## 2015-06-10 ENCOUNTER — Encounter: Payer: Self-pay | Admitting: Medical

## 2015-06-10 VITALS — BP 157/103 | HR 85 | Temp 98.6°F | Ht 66.75 in | Wt 188.6 lb

## 2015-06-10 DIAGNOSIS — F411 Generalized anxiety disorder: Secondary | ICD-10-CM | POA: Diagnosis not present

## 2015-06-10 MED ORDER — ALPRAZOLAM 1 MG PO TABS: ORAL_TABLET | ORAL | Status: DC

## 2015-06-10 NOTE — Progress Notes (Signed)
Pre visit review using our clinic review tool, if applicable. No additional management support is needed unless otherwise documented below in the visit note. 

## 2015-06-10 NOTE — Patient Instructions (Addendum)
I am going to give you prescription of xanax today And will follow your drug screen. You need to only take what is prescribed by our clinic. Do not take any tablets from your daughter or other persons. I may not be able to further rx controlled meds if again test come back positive. We will call you with the results of your drug screen. Follow up date to be determined after lab review.

## 2015-06-10 NOTE — Telephone Encounter (Signed)
Error/gd °

## 2015-06-10 NOTE — Progress Notes (Signed)
Subjective:    Patient ID: Katie Merritt, female    DOB: December 22, 1970, 44 y.o.   MRN: [id number]  HPI  Review of pt drug screen today. Discuss the + findings. Pt states absolutley that she is not taking any medications found + on  her drug screen results. Pt only admits to use of xanax which she is taking.  She is also  taking some left of buspar occasionally(left over from what I rx'd. Pt taking blood pressure medicine still.   Pt did admit to taking 1/2 tablet of oxcodone for ha. She borrowed her daughters tablet. She states not sure if it was before or after drug screen.  But again denies any use of coccaine.       Review of Systems  Constitutional: Negative for fever, chills and fatigue.  Respiratory: Negative for cough, chest tightness, shortness of breath and wheezing.   Cardiovascular: Negative for chest pain and palpitations.  Musculoskeletal: Negative for myalgias.  Neurological: Negative for dizziness, speech difficulty, weakness and headaches.  Hematological: Negative for adenopathy. Does not bruise/bleed easily.  Psychiatric/Behavioral: Negative for suicidal ideas, behavioral problems, confusion and self-injury. The patient is nervous/anxious.    Past Medical History  Diagnosis Date  . Anxiety   . COPD (chronic obstructive pulmonary disease)   . Hyperlipidemia   . Hypertension     Social History   Social History  . Marital Status: Single    Spouse Name: N/A  . Number of Children: N/A  . Years of Education: N/A   Occupational History  . Not on file.   Social History Main Topics  . Smoking status: Never Smoker   . Smokeless tobacco: Never Used  . Alcohol Use: 0.0 oz/week    0 Standard drinks or equivalent per week  . Drug Use: Not on file  . Sexual Activity: Not on file   Other Topics Concern  . Not on file   Social History Narrative    Past Surgical History  Procedure Laterality Date  . Tubal ligation    . Leap      Family History  Problem  Relation Age of Onset  . Hyperlipidemia Mother   . Hypertension Mother     No Known Allergies  Current Outpatient Prescriptions on File Prior to Visit  Medication Sig Dispense Refill  . Albuterol Sulfate (PROAIR HFA IN) Inhale 2 puffs into the lungs. 2 puffs as needed    . amLODipine-olmesartan (AZOR) 5-40 MG per tablet Take 1 tablet by mouth daily. 30 tablet 2  . atorvastatin (LIPITOR) 80 MG tablet Take 80 mg by mouth at bedtime.    Marland Kitchen losartan (COZAAR) 50 MG tablet Take 50 mg by mouth daily.    Marland Kitchen venlafaxine XR (EFFEXOR XR) 150 MG 24 hr capsule Take 1 capsule (150 mg total) by mouth daily with breakfast. 30 capsule 2   No current facility-administered medications on file prior to visit.    BP 157/103 mmHg  Pulse 85  Temp(Src) 98.6 F (37 C) (Oral)  Ht 5' 6.75" (1.695 m)  Wt 188 lb 9.6 oz (85.548 kg)  BMI 29.78 kg/m2  SpO2 100%  LMP 05/29/2015       Objective:   Physical Exam  General Mental Status- Alert. General Appearance- Not in acute distress.   Skin General: Color- Normal Color. Moisture- Normal Moisture.  Neck Carotid Arteries- Normal color. Moisture- Normal Moisture. No carotid bruits. No JVD.  Chest and Lung Exam Auscultation: Breath Sounds:-Normal. CTA.  Cardiovascular Auscultation:Rythm- Regular,  Rate and rhythm. Murmurs & Other Heart Sounds:Auscultation of the heart reveals- No Murmurs.  Abdomen Inspection:-Inspeection Normal. Palpation/Percussion:Note:No mass. Palpation and Percussion of the abdomen reveal- Non Tender, Non Distended + BS, no rebound or guarding.    Neurologic Cranial Nerve exam:- CN III-XII intact(No nystagmus), symmetric smile. Strength:- 5/5 equal and symmetric strength both upper and lower extremities.      Assessment & Plan:  I am going to give you prescription of xanax today And will follow your drug screen. You need to only take what is prescribed by our clinic. Do not take any tablets from your daughter or other  persons. I may not be able to further rx controlled meds if again test come back positive. We will call you with the results of your drug screen. Follow up date to be determined after lab review.

## 2015-06-10 NOTE — Telephone Encounter (Signed)
Called patient will be in today to discuss Drug Screen report/Xanax Rx.

## 2015-07-03 ENCOUNTER — Telehealth: Payer: Self-pay | Admitting: Medical

## 2015-07-03 NOTE — Telephone Encounter (Signed)
Pt calling regarding results from 2nd drug screen done 06/10/15 when she came in for appt with Percell Miller. She has the EOB from [facility] stating it was billed to insurance but we do not have results. Please contact them and notify pt once we have results.

## 2015-07-04 ENCOUNTER — Encounter: Payer: Self-pay | Admitting: Medical

## 2015-07-04 NOTE — Telephone Encounter (Signed)
Spoke with Anderson Malta at [facility] and she will fax results over.

## 2015-07-04 NOTE — Telephone Encounter (Signed)
Left detailed message for pt to call back if she had any questions.

## 2015-07-05 ENCOUNTER — Other Ambulatory Visit: Payer: Self-pay | Admitting: Medical

## 2015-07-07 NOTE — Telephone Encounter (Signed)
I did refill her buspar. Let her know that. If she is on buspar then I would not want to rx xanax further. With buspar would not require any drug screens. Is this how she wants to proceed? Also most recent drug screen was negative on review.

## 2015-07-07 NOTE — Telephone Encounter (Signed)
Called patient answering machine came on. Will try again

## 2015-07-08 LAB — HM PAP SMEAR

## 2015-07-18 ENCOUNTER — Other Ambulatory Visit: Payer: Self-pay | Admitting: Obstetrics and Gynecology

## 2015-07-18 DIAGNOSIS — N6489 Other specified disorders of breast: Secondary | ICD-10-CM

## 2015-07-22 ENCOUNTER — Ambulatory Visit
Admission: RE | Admit: 2015-07-22 | Discharge: 2015-07-22 | Disposition: A | Discharge status: Home / Self Care | Payer: BLUE CROSS/BLUE SHIELD | Source: Ambulatory Visit | Attending: Obstetrics and Gynecology | Admitting: Obstetrics and Gynecology

## 2015-07-22 ENCOUNTER — Other Ambulatory Visit: Payer: Self-pay | Admitting: Obstetrics and Gynecology

## 2015-07-22 DIAGNOSIS — N6489 Other specified disorders of breast: Secondary | ICD-10-CM

## 2015-08-11 ENCOUNTER — Telehealth: Payer: Self-pay | Admitting: Medical

## 2015-08-11 ENCOUNTER — Encounter: Payer: BLUE CROSS/BLUE SHIELD | Admitting: Medical

## 2015-08-11 NOTE — Progress Notes (Signed)
This encounter was created in error - please disregard.

## 2015-08-13 NOTE — Telephone Encounter (Signed)
Pt was no show 08/11/15 8:00am for f/u appt, pt has not rescheduled, 1st no show, charge or no charge?

## 2015-08-13 NOTE — Telephone Encounter (Signed)
If first no show. No charge.

## 2015-10-23 ENCOUNTER — Encounter: Payer: Self-pay | Admitting: Medical

## 2015-10-23 ENCOUNTER — Ambulatory Visit (INDEPENDENT_AMBULATORY_CARE_PROVIDER_SITE_OTHER): Payer: BLUE CROSS/BLUE SHIELD | Admitting: Medical

## 2015-10-23 VITALS — BP 140/110 | HR 81 | Temp 98.5°F | Ht 66.75 in | Wt 198.0 lb

## 2015-10-23 DIAGNOSIS — F411 Generalized anxiety disorder: Secondary | ICD-10-CM | POA: Diagnosis not present

## 2015-10-23 DIAGNOSIS — I10 Essential (primary) hypertension: Secondary | ICD-10-CM | POA: Diagnosis not present

## 2015-10-23 DIAGNOSIS — R635 Abnormal weight gain: Secondary | ICD-10-CM

## 2015-10-23 DIAGNOSIS — E669 Obesity, unspecified: Secondary | ICD-10-CM

## 2015-10-23 DIAGNOSIS — E785 Hyperlipidemia, unspecified: Secondary | ICD-10-CM

## 2015-10-23 DIAGNOSIS — R0600 Dyspnea, unspecified: Secondary | ICD-10-CM | POA: Diagnosis not present

## 2015-10-23 MED ORDER — VENLAFAXINE HCL ER 150 MG PO CP24: 150.0000 mg | ORAL_CAPSULE | Freq: Every day | ORAL | Status: DC

## 2015-10-23 MED ORDER — AMLODIPINE-OLMESARTAN 5-40 MG PO TABS: 1.0000 | ORAL_TABLET | Freq: Every day | ORAL | Status: DC

## 2015-10-23 NOTE — Patient Instructions (Signed)
For your htn. I want you to take medication daily and check bp 3 times or week.(Check bp more if symptomatic) I want be to be less than 140/90.  I want to verify that ha are related to bp and adjust meds if necessary. If ha not bp related then need to work up ha.  For your anxiety(well controlled on effexor). Refill effexor.  For weight loss consider program such as weight watcher or diet 1400-1600 cal with exercise program.  With hx of copd per records(vs asthma), I want you to try 2 puff of albuterol 30 minutes before exercise. See if this resolves exercise symptoms. If not then will refer you to pulmonologist for further work up.  Do not exercise if chest pain type symptoms. If this were to occur be evaluated asap.  Follow up in 1 month or as needed  Future labs in next 1-2 wks fasting.
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
## 2015-10-23 NOTE — Progress Notes (Signed)
Pre visit review using our clinic review tool, if applicable. No additional management support is needed unless otherwise documented below in the visit note. 

## 2015-10-28 ENCOUNTER — Telehealth: Payer: Self-pay | Admitting: Medical

## 2015-10-28 NOTE — Telephone Encounter (Signed)
Will ask/remind  MA to get labs that were ordered.

## 2015-10-29 NOTE — Telephone Encounter (Signed)
Left message for pt to call and schedule a time this week for some lab work.

## 2015-10-29 NOTE — Telephone Encounter (Signed)
MyChart message sent to pt

## 2015-11-06 ENCOUNTER — Other Ambulatory Visit: Payer: BLUE CROSS/BLUE SHIELD

## 2015-11-24 ENCOUNTER — Encounter: Payer: BLUE CROSS/BLUE SHIELD | Admitting: Medical

## 2015-11-24 ENCOUNTER — Ambulatory Visit: Payer: BLUE CROSS/BLUE SHIELD | Admitting: Medical

## 2015-11-24 NOTE — Progress Notes (Signed)
This encounter was created in error - please disregard.

## 2015-11-25 ENCOUNTER — Telehealth: Payer: Self-pay | Admitting: Medical

## 2015-11-25 NOTE — Telephone Encounter (Signed)
charge 

## 2015-11-25 NOTE — Telephone Encounter (Signed)
Pt was no show 1/30 9:15am for follow up, pt has not rescheduled, 2nd no show, charge or no charge?

## 2015-11-26 ENCOUNTER — Encounter: Payer: Self-pay | Admitting: Medical

## 2015-11-26 NOTE — Telephone Encounter (Signed)
Marked to charge, mailing letter °

## 2016-06-04 ENCOUNTER — Ambulatory Visit (INDEPENDENT_AMBULATORY_CARE_PROVIDER_SITE_OTHER): Payer: BLUE CROSS/BLUE SHIELD | Admitting: Medical

## 2016-06-04 ENCOUNTER — Telehealth: Payer: Self-pay | Admitting: Medical

## 2016-06-04 ENCOUNTER — Encounter: Payer: Self-pay | Admitting: Medical

## 2016-06-04 VITALS — BP 160/98 | HR 79 | Temp 98.5°F | Ht 67.0 in | Wt 204.2 lb

## 2016-06-04 DIAGNOSIS — Z23 Encounter for immunization: Secondary | ICD-10-CM | POA: Diagnosis not present

## 2016-06-04 DIAGNOSIS — Z0000 Encounter for general adult medical examination without abnormal findings: Secondary | ICD-10-CM

## 2016-06-04 MED ORDER — AMLODIPINE-OLMESARTAN 5-40 MG PO TABS: 1.0000 | ORAL_TABLET | Freq: Every day | ORAL | 2 refills | Status: DC

## 2016-06-04 NOTE — Telephone Encounter (Signed)
Pt dropped off documents to be filled out (FMLA paperwork). Pt would like to have it fax to [phone]. Document put at front office tray.

## 2016-06-04 NOTE — Progress Notes (Signed)
Subjective:    Patient ID: Katie Merritt, female    DOB: 1971/03/23, 45 y.o.   MRN: [id number]  HPI   Pt in for follow physical exam.   Pt is not fasting today.  Pt has not been exercising recently over past month. She was exercising about 2 days a week before. Pt just started weight loss clinic. She is taking in about 1000 calorie and is on phentermine for past  Week.  Pt used to be on azor. We had her on that in the past. She is in to recheck bp. She is also aware that phentermine for weight loss can effect bp and pulse. Faint occasional ha but none recently. No chest pain or cardiac symptoms.  Pt needs tdap.  Pt does not do flu vaccine.  Pt Pt has gynecologist and appointment in September. Pt is up to date on mammgoram.  LMP- presently. Began.         Review of Systems  Constitutional: Negative for activity change, chills, diaphoresis, fatigue and fever.  HENT: Negative for congestion, ear discharge and sore throat.   Respiratory: Negative for cough, chest tightness and shortness of breath.   Cardiovascular: Negative for chest pain, palpitations and leg swelling.  Gastrointestinal: Negative for abdominal pain, nausea and vomiting.  Endocrine: Negative for polydipsia, polyphagia and polyuria.  Genitourinary: Negative for dysuria and frequency.  Musculoskeletal: Negative for back pain, neck pain and neck stiffness.  Skin: Negative for rash.  Neurological: Negative for dizziness, weakness and headaches.  Psychiatric/Behavioral: Negative for agitation, behavioral problems and confusion. The patient is not nervous/anxious.    Past Medical History:  Diagnosis Date  . Anxiety   . COPD (chronic obstructive pulmonary disease) (HCC)   . Hyperlipidemia   . Hypertension      Social History   Social History  . Marital status: Single    Spouse name: N/A  . Number of children: N/A  . Years of education: N/A   Occupational History  . Not on file.   Social History  Main Topics  . Smoking status: Never Smoker  . Smokeless tobacco: Never Used  . Alcohol use 0.0 oz/week  . Drug use: Unknown  . Sexual activity: Not on file   Other Topics Concern  . Not on file   Social History Narrative  . No narrative on file    Past Surgical History:  Procedure Laterality Date  . Leap    . TUBAL LIGATION      Family History  Problem Relation Age of Onset  . Hyperlipidemia Mother   . Hypertension Mother     No Known Allergies  Current Outpatient Prescriptions on File Prior to Visit  Medication Sig Dispense Refill  . Albuterol Sulfate (PROAIR HFA IN) Inhale 2 puffs into the lungs. 2 puffs as needed    . ALPRAZolam (XANAX) 1 MG tablet 1/2 tablet-1 tablet  by mouth as needed 2 times daily. (Patient not taking: Reported on 06/04/2016) 30 tablet 0  . amLODipine-olmesartan (AZOR) 5-40 MG tablet Take 1 tablet by mouth daily. (Patient not taking: Reported on 06/04/2016) 30 tablet 2  . atorvastatin (LIPITOR) 80 MG tablet Take 80 mg by mouth at bedtime.    Marland Kitchen venlafaxine XR (EFFEXOR XR) 150 MG 24 hr capsule Take 1 capsule (150 mg total) by mouth daily with breakfast. (Patient not taking: Reported on 06/04/2016) 30 capsule 5   No current facility-administered medications on file prior to visit.     BP (!) 160/98  Pulse 79   Temp 98.5 F (36.9 C) (Oral)   Ht 5\' 7"  (1.702 m)   Wt 204 lb 3.2 oz (92.6 kg)   SpO2 100%   BMI 31.98 kg/m       Objective:   Physical Exam  General Mental Status- Alert. General Appearance- Not in acute distress.   Skin General: Color- Normal Color. Moisture- Normal Moisture.  Neck Carotid Arteries- Normal color. Moisture- Normal Moisture. No carotid bruits. No JVD.  Chest and Lung Exam Auscultation: Breath Sounds:-Normal.  Cardiovascular Auscultation:Rythm- Regular. Murmurs & Other Heart Sounds:Auscultation of the heart reveals- No Murmurs.  Abdomen Inspection:-Inspeection Normal. Palpation/Percussion:Note:No mass.  Palpation and Percussion of the abdomen reveal- Non Tender, Non Distended + BS, no rebound or guarding.   Neurologic Cranial Nerve exam:- CN III-XII intact(No nystagmus), symmetric smile.  Strength:- 5/5 equal and symmetric strength both upper and lower extremities.  Breast and pelvic- deferred to gyn    Assessment & Plan:  Will get cbc, cmp, tsh, lipid, and ua today. These area future labs. Notify them you will get ua and associate with physical exam.  Diet and exercise.  Your bp is high today and you have not been on you azor. I am writing rx today. Please stop phentermine presently. This can increase bp and pulse. Get bp checked at weight loss center and come here for nurse bp check next day. Also check you bp at home with your machine. Then we will decide after reviewing bp readings  if you can tolerate low dose of phentermine. Generally I usually try to avoid any phentermine due to side effects.   Get tdap today.  Get pap and mammo with gyn  Follow up 10-14 days bp check or as needed   Jannell Franta, Ramon Dredge, [employer]

## 2016-06-04 NOTE — Addendum Note (Signed)
Addended by: Bunnie Domino on: 06/04/2016 02:48 PM   Modules accepted: Orders

## 2016-06-04 NOTE — Progress Notes (Signed)
Pre visit review using our clinic tool,if applicable. No additional management support is needed unless otherwise documented below in the visit note.  

## 2016-06-04 NOTE — Patient Instructions (Addendum)
Will get cbc, cmp, tsh, lipid, and ua today. These area future labs. Notify them you will get ua and associate with physical exam.  Diet and exercise.  Your bp is high today and you have not been on you azor. I am writing rx today. Please stop phentermine presently. This can increase bp and pulse. Get bp checked at [facility] and come here for nurse bp check next day. Also check you bp at home with your machine. Then we will decide after reviewing bp readings  if you can tolerate low dose of phentermine. Generally I usually try to avoid any phentermine due to side effects.   Get tdap today.  Get pap and mammo with gyn  Follow up 10-14 days bp check or as needed   Preventive Care for Adults, Female A healthy lifestyle and preventive care can promote health and wellness. Preventive health guidelines for women include the following key practices.  A routine yearly physical is a good way to check with your health care provider about your health and preventive screening. It is a chance to share any concerns and updates on your health and to receive a thorough exam.  Visit your dentist for a routine exam and preventive care every 6 months. Brush your teeth twice a day and floss once a day. Good oral hygiene prevents tooth decay and gum disease.  The frequency of eye exams is based on your age, health, family medical history, use of contact lenses, and other factors. Follow your health care provider's recommendations for frequency of eye exams.  Eat a healthy diet. Foods like vegetables, fruits, whole grains, low-fat dairy products, and lean protein foods contain the nutrients you need without too many calories. Decrease your intake of foods high in solid fats, added sugars, and salt. Eat the right amount of calories for you.Get information about a proper diet from your health care provider, if necessary.  Regular physical exercise is one of the most important things you can do for your  health. Most adults should get at least 150 minutes of moderate-intensity exercise (any activity that increases your heart rate and causes you to sweat) each week. In addition, most adults need muscle-strengthening exercises on 2 or more days a week.  Maintain a healthy weight. The body mass index (BMI) is a screening tool to identify possible weight problems. It provides an estimate of body fat based on height and weight. Your health care provider can find your BMI and can help you achieve or maintain a healthy weight.For adults 20 years and older:  A BMI below 18.5 is considered underweight.  A BMI of 18.5 to 24.9 is normal.  A BMI of 25 to 29.9 is considered overweight.  A BMI of 30 and above is considered obese.  Maintain normal blood lipids and cholesterol levels by exercising and minimizing your intake of saturated fat. Eat a balanced diet with plenty of fruit and vegetables. Blood tests for lipids and cholesterol should begin at age 64 and be repeated every 5 years. If your lipid or cholesterol levels are high, you are over 50, or you are at high risk for heart disease, you may need your cholesterol levels checked more frequently.Ongoing high lipid and cholesterol levels should be treated with medicines if diet and exercise are not working.  If you smoke, find out from your health care provider how to quit. If you do not use tobacco, do not start.  Lung cancer screening is recommended for adults aged  55-80 years who are at high risk for developing lung cancer because of a history of smoking. A yearly low-dose CT scan of the lungs is recommended for people who have at least a 30-pack-year history of smoking and are a current smoker or have quit within the past 15 years. A pack year of smoking is smoking an average of 1 pack of cigarettes a day for 1 year (for example: 1 pack a day for 30 years or 2 packs a day for 15 years). Yearly screening should continue until the smoker has stopped  smoking for at least 15 years. Yearly screening should be stopped for people who develop a health problem that would prevent them from having lung cancer treatment.  If you are pregnant, do not drink alcohol. If you are breastfeeding, be very cautious about drinking alcohol. If you are not pregnant and choose to drink alcohol, do not have more than 1 drink per day. One drink is considered to be 12 ounces (355 mL) of beer, 5 ounces (148 mL) of wine, or 1.5 ounces (44 mL) of liquor.  Avoid use of street drugs. Do not share needles with anyone. Ask for help if you need support or instructions about stopping the use of drugs.  High blood pressure causes heart disease and increases the risk of stroke. Your blood pressure should be checked at least every 1 to 2 years. Ongoing high blood pressure should be treated with medicines if weight loss and exercise do not work.  If you are 28-48 years old, ask your health care provider if you should take aspirin to prevent strokes.  Diabetes screening is done by taking a blood sample to check your blood glucose level after you have not eaten for a certain period of time (fasting). If you are not overweight and you do not have risk factors for diabetes, you should be screened once every 3 years starting at age 10. If you are overweight or obese and you are 33-55 years of age, you should be screened for diabetes every year as part of your cardiovascular risk assessment.  Breast cancer screening is essential preventive care for women. You should practice "breast self-awareness." This means understanding the normal appearance and feel of your breasts and may include breast self-examination. Any changes detected, no matter how small, should be reported to a health care provider. Women in their 55s and 30s should have a clinical breast exam (CBE) by a health care provider as part of a regular health exam every 1 to 3 years. After age 78, women should have a CBE every year.  Starting at age 86, women should consider having a mammogram (breast X-ray test) every year. Women who have a family history of breast cancer should talk to their health care provider about genetic screening. Women at a high risk of breast cancer should talk to their health care providers about having an MRI and a mammogram every year.  Breast cancer gene (BRCA)-related cancer risk assessment is recommended for women who have family members with BRCA-related cancers. BRCA-related cancers include breast, ovarian, tubal, and peritoneal cancers. Having family members with these cancers may be associated with an increased risk for harmful changes (mutations) in the breast cancer genes BRCA1 and BRCA2. Results of the assessment will determine the need for genetic counseling and BRCA1 and BRCA2 testing.  Your health care provider may recommend that you be screened regularly for cancer of the pelvic organs (ovaries, uterus, and vagina). This screening involves a pelvic  examination, including checking for microscopic changes to the surface of your cervix (Pap test). You may be encouraged to have this screening done every 3 years, beginning at age 18.  For women ages 11-65, health care providers may recommend pelvic exams and Pap testing every 3 years, or they may recommend the Pap and pelvic exam, combined with testing for human papilloma virus (HPV), every 5 years. Some types of HPV increase your risk of cervical cancer. Testing for HPV may also be done on women of any age with unclear Pap test results.  Other health care providers may not recommend any screening for nonpregnant women who are considered low risk for pelvic cancer and who do not have symptoms. Ask your health care provider if a screening pelvic exam is right for you.  If you have had past treatment for cervical cancer or a condition that could lead to cancer, you need Pap tests and screening for cancer for at least 20 years after your treatment.  If Pap tests have been discontinued, your risk factors (such as having a new sexual partner) need to be reassessed to determine if screening should resume. Some women have medical problems that increase the chance of getting cervical cancer. In these cases, your health care provider may recommend more frequent screening and Pap tests.  Colorectal cancer can be detected and often prevented. Most routine colorectal cancer screening begins at the age of 93 years and continues through age 37 years. However, your health care provider may recommend screening at an earlier age if you have risk factors for colon cancer. On a yearly basis, your health care provider may provide home test kits to check for hidden blood in the stool. Use of a small camera at the end of a tube, to directly examine the colon (sigmoidoscopy or colonoscopy), can detect the earliest forms of colorectal cancer. Talk to your health care provider about this at age 71, when routine screening begins. Direct exam of the colon should be repeated every 5-10 years through age 6 years, unless early forms of precancerous polyps or small growths are found.  People who are at an increased risk for hepatitis B should be screened for this virus. You are considered at high risk for hepatitis B if:  You were born in a country where hepatitis B occurs often. Talk with your health care provider about which countries are considered high risk.  Your parents were born in a high-risk country and you have not received a shot to protect against hepatitis B (hepatitis B vaccine).  You have HIV or AIDS.  You use needles to inject street drugs.  You live with, or have sex with, someone who has hepatitis B.  You get hemodialysis treatment.  You take certain medicines for conditions like cancer, organ transplantation, and autoimmune conditions.  Hepatitis C blood testing is recommended for all people born from [birth year] through 1964 and any individual with known  risks for hepatitis C.  Practice safe sex. Use condoms and avoid high-risk sexual practices to reduce the spread of sexually transmitted infections (STIs). STIs include gonorrhea, chlamydia, syphilis, trichomonas, herpes, HPV, and human immunodeficiency virus (HIV). Herpes, HIV, and HPV are viral illnesses that have no cure. They can result in disability, cancer, and death.  You should be screened for sexually transmitted illnesses (STIs) including gonorrhea and chlamydia if:  You are sexually active and are younger than 24 years.  You are older than 24 years and your health care provider tells you  that you are at risk for this type of infection.  Your sexual activity has changed since you were last screened and you are at an increased risk for chlamydia or gonorrhea. Ask your health care provider if you are at risk.  If you are at risk of being infected with HIV, it is recommended that you take a prescription medicine daily to prevent HIV infection. This is called preexposure prophylaxis (PrEP). You are considered at risk if:  You are sexually active and do not regularly use condoms or know the HIV status of your partner(s).  You take drugs by injection.  You are sexually active with a partner who has HIV.  Talk with your health care provider about whether you are at high risk of being infected with HIV. If you choose to begin PrEP, you should first be tested for HIV. You should then be tested every 3 months for as long as you are taking PrEP.  Osteoporosis is a disease in which the bones lose minerals and strength with aging. This can result in serious bone fractures or breaks. The risk of osteoporosis can be identified using a bone density scan. Women ages 56 years and over and women at risk for fractures or osteoporosis should discuss screening with their health care providers. Ask your health care provider whether you should take a calcium supplement or vitamin D to reduce the rate of  osteoporosis.  Menopause can be associated with physical symptoms and risks. Hormone replacement therapy is available to decrease symptoms and risks. You should talk to your health care provider about whether hormone replacement therapy is right for you.  Use sunscreen. Apply sunscreen liberally and repeatedly throughout the day. You should seek shade when your shadow is shorter than you. Protect yourself by wearing long sleeves, pants, a wide-brimmed hat, and sunglasses year round, whenever you are outdoors.  Once a month, do a whole body skin exam, using a mirror to look at the skin on your back. Tell your health care provider of new moles, moles that have irregular borders, moles that are larger than a pencil eraser, or moles that have changed in shape or color.  Stay current with required vaccines (immunizations).  Influenza vaccine. All adults should be immunized every year.  Tetanus, diphtheria, and acellular pertussis (Td, Tdap) vaccine. Pregnant women should receive 1 dose of Tdap vaccine during each pregnancy. The dose should be obtained regardless of the length of time since the last dose. Immunization is preferred during the 27th-36th week of gestation. An adult who has not previously received Tdap or who does not know her vaccine status should receive 1 dose of Tdap. This initial dose should be followed by tetanus and diphtheria toxoids (Td) booster doses every 10 years. Adults with an unknown or incomplete history of completing a 3-dose immunization series with Td-containing vaccines should begin or complete a primary immunization series including a Tdap dose. Adults should receive a Td booster every 10 years.  Varicella vaccine. An adult without evidence of immunity to varicella should receive 2 doses or a second dose if she has previously received 1 dose. Pregnant females who do not have evidence of immunity should receive the first dose after pregnancy. This first dose should be  obtained before leaving the health care facility. The second dose should be obtained 4-8 weeks after the first dose.  Human papillomavirus (HPV) vaccine. Females aged 13-26 years who have not received the vaccine previously should obtain the 3-dose series. The vaccine is  not recommended for use in pregnant females. However, pregnancy testing is not needed before receiving a dose. If a female is found to be pregnant after receiving a dose, no treatment is needed. In that case, the remaining doses should be delayed until after the pregnancy. Immunization is recommended for any person with an immunocompromised condition through the age of 63 years if she did not get any or all doses earlier. During the 3-dose series, the second dose should be obtained 4-8 weeks after the first dose. The third dose should be obtained 24 weeks after the first dose and 16 weeks after the second dose.  Zoster vaccine. One dose is recommended for adults aged 71 years or older unless certain conditions are present.  Measles, mumps, and rubella (MMR) vaccine. Adults born before 4 generally are considered immune to measles and mumps. Adults born in [birth year] or later should have 1 or more doses of MMR vaccine unless there is a contraindication to the vaccine or there is laboratory evidence of immunity to each of the three diseases. A routine second dose of MMR vaccine should be obtained at least 28 days after the first dose for students attending postsecondary schools, health care workers, or international travelers. People who received inactivated measles vaccine or an unknown type of measles vaccine during 1963-1967 should receive 2 doses of MMR vaccine. People who received inactivated mumps vaccine or an unknown type of mumps vaccine before 1979 and are at high risk for mumps infection should consider immunization with 2 doses of MMR vaccine. For females of childbearing age, rubella immunity should be determined. If there is no evidence  of immunity, females who are not pregnant should be vaccinated. If there is no evidence of immunity, females who are pregnant should delay immunization until after pregnancy. Unvaccinated health care workers born before [birth year] who lack laboratory evidence of measles, mumps, or rubella immunity or laboratory confirmation of disease should consider measles and mumps immunization with 2 doses of MMR vaccine or rubella immunization with 1 dose of MMR vaccine.  Pneumococcal 13-valent conjugate (PCV13) vaccine. When indicated, a person who is uncertain of his immunization history and has no record of immunization should receive the PCV13 vaccine. All adults 50 years of age and older should receive this vaccine. An adult aged 97 years or older who has certain medical conditions and has not been previously immunized should receive 1 dose of PCV13 vaccine. This PCV13 should be followed with a dose of pneumococcal polysaccharide (PPSV23) vaccine. Adults who are at high risk for pneumococcal disease should obtain the PPSV23 vaccine at least 8 weeks after the dose of PCV13 vaccine. Adults older than 44 years of age who have normal immune system function should obtain the PPSV23 vaccine dose at least 1 year after the dose of PCV13 vaccine.  Pneumococcal polysaccharide (PPSV23) vaccine. When PCV13 is also indicated, PCV13 should be obtained first. All adults aged 43 years and older should be immunized. An adult younger than age 71 years who has certain medical conditions should be immunized. Any person who resides in a nursing home or long-term care facility should be immunized. An adult smoker should be immunized. People with an immunocompromised condition and certain other conditions should receive both PCV13 and PPSV23 vaccines. People with human immunodeficiency virus (HIV) infection should be immunized as soon as possible after diagnosis. Immunization during chemotherapy or radiation therapy should be avoided. Routine use  of PPSV23 vaccine is not recommended for American Indians, [place] Natives, or people younger  than 65 years unless there are medical conditions that require PPSV23 vaccine. When indicated, people who have unknown immunization and have no record of immunization should receive PPSV23 vaccine. One-time revaccination 5 years after the first dose of PPSV23 is recommended for people aged 19-64 years who have chronic kidney failure, nephrotic syndrome, asplenia, or immunocompromised conditions. People who received 1-2 doses of PPSV23 before age 55 years should receive another dose of PPSV23 vaccine at age 34 years or later if at least 5 years have passed since the previous dose. Doses of PPSV23 are not needed for people immunized with PPSV23 at or after age 3 years.  Meningococcal vaccine. Adults with asplenia or persistent complement component deficiencies should receive 2 doses of quadrivalent meningococcal conjugate (MenACWY-D) vaccine. The doses should be obtained at least 2 months apart. Microbiologists working with certain meningococcal bacteria, [place] recruits, people at risk during an outbreak, and people who travel to or live in countries with a high rate of meningitis should be immunized. A first-year college student up through age 25 years who is living in a residence hall should receive a dose if she did not receive a dose on or after her 16th birthday. Adults who have certain high-risk conditions should receive one or more doses of vaccine.  Hepatitis A vaccine. Adults who wish to be protected from this disease, have certain high-risk conditions, work with hepatitis A-infected animals, work in hepatitis A research labs, or travel to or work in countries with a high rate of hepatitis A should be immunized. Adults who were previously unvaccinated and who anticipate close contact with an international adoptee during the first 60 days after arrival in the [place] from a country with a high rate of  hepatitis A should be immunized.  Hepatitis B vaccine. Adults who wish to be protected from this disease, have certain high-risk conditions, may be exposed to blood or other infectious body fluids, are household contacts or sex partners of hepatitis B positive people, are clients or workers in certain care facilities, or travel to or work in countries with a high rate of hepatitis B should be immunized.  Haemophilus influenzae type b (Hib) vaccine. A previously unvaccinated person with asplenia or sickle cell disease or having a scheduled splenectomy should receive 1 dose of Hib vaccine. Regardless of previous immunization, a recipient of a hematopoietic stem cell transplant should receive a 3-dose series 6-12 months after her successful transplant. Hib vaccine is not recommended for adults with HIV infection. Preventive Services / Frequency Ages 73 to 65 years  Blood pressure check.** / Every 3-5 years.  Lipid and cholesterol check.** / Every 5 years beginning at age 72.  Clinical breast exam.** / Every 3 years for women in their 19s and 56s.  BRCA-related cancer risk assessment.** / For women who have family members with a BRCA-related cancer (breast, ovarian, tubal, or peritoneal cancers).  Pap test.** / Every 2 years from ages 66 through 56. Every 3 years starting at age 29 through age 2 or 69 with a history of 3 consecutive normal Pap tests.  HPV screening.** / Every 3 years from ages 53 through ages 40 to 78 with a history of 3 consecutive normal Pap tests.  Hepatitis C blood test.** / For any individual with known risks for hepatitis C.  Skin self-exam. / Monthly.  Influenza vaccine. / Every year.  Tetanus, diphtheria, and acellular pertussis (Tdap, Td) vaccine.** / Consult your health care provider. Pregnant women should receive 1 dose of  Tdap vaccine during each pregnancy. 1 dose of Td every 10 years.  Varicella vaccine.** / Consult your health care provider. Pregnant females  who do not have evidence of immunity should receive the first dose after pregnancy.  HPV vaccine. / 3 doses over 6 months, if 19 and younger. The vaccine is not recommended for use in pregnant females. However, pregnancy testing is not needed before receiving a dose.  Measles, mumps, rubella (MMR) vaccine.** / You need at least 1 dose of MMR if you were born in 1957 or later. You may also need a 2nd dose. For females of childbearing age, rubella immunity should be determined. If there is no evidence of immunity, females who are not pregnant should be vaccinated. If there is no evidence of immunity, females who are pregnant should delay immunization until after pregnancy.  Pneumococcal 13-valent conjugate (PCV13) vaccine.** / Consult your health care provider.  Pneumococcal polysaccharide (PPSV23) vaccine.** / 1 to 2 doses if you smoke cigarettes or if you have certain conditions.  Meningococcal vaccine.** / 1 dose if you are age 58 to 13 years and a Market researcher living in a residence hall, or have one of several medical conditions, you need to get vaccinated against meningococcal disease. You may also need additional booster doses.  Hepatitis A vaccine.** / Consult your health care provider.  Hepatitis B vaccine.** / Consult your health care provider.  Haemophilus influenzae type b (Hib) vaccine.** / Consult your health care provider. Ages 70 to 87 years  Blood pressure check.** / Every year.  Lipid and cholesterol check.** / Every 5 years beginning at age 41 years.  Lung cancer screening. / Every year if you are aged 66-80 years and have a 30-pack-year history of smoking and currently smoke or have quit within the past 15 years. Yearly screening is stopped once you have quit smoking for at least 15 years or develop a health problem that would prevent you from having lung cancer treatment.  Clinical breast exam.** / Every year after age 43 years.  BRCA-related cancer risk  assessment.** / For women who have family members with a BRCA-related cancer (breast, ovarian, tubal, or peritoneal cancers).  Mammogram.** / Every year beginning at age 24 years and continuing for as long as you are in good health. Consult with your health care provider.  Pap test.** / Every 3 years starting at age 18 years through age 54 or 52 years with a history of 3 consecutive normal Pap tests.  HPV screening.** / Every 3 years from ages 69 years through ages 51 to 7 years with a history of 3 consecutive normal Pap tests.  Fecal occult blood test (FOBT) of stool. / Every year beginning at age 33 years and continuing until age 33 years. You may not need to do this test if you get a colonoscopy every 10 years.  Flexible sigmoidoscopy or colonoscopy.** / Every 5 years for a flexible sigmoidoscopy or every 10 years for a colonoscopy beginning at age 56 years and continuing until age 61 years.  Hepatitis C blood test.** / For all people born from [birth year] through 1964 and any individual with known risks for hepatitis C.  Skin self-exam. / Monthly.  Influenza vaccine. / Every year.  Tetanus, diphtheria, and acellular pertussis (Tdap/Td) vaccine.** / Consult your health care provider. Pregnant women should receive 1 dose of Tdap vaccine during each pregnancy. 1 dose of Td every 10 years.  Varicella vaccine.** / Consult your health care provider. Pregnant females  who do not have evidence of immunity should receive the first dose after pregnancy.  Zoster vaccine.** / 1 dose for adults aged 61 years or older.  Measles, mumps, rubella (MMR) vaccine.** / You need at least 1 dose of MMR if you were born in 1957 or later. You may also need a second dose. For females of childbearing age, rubella immunity should be determined. If there is no evidence of immunity, females who are not pregnant should be vaccinated. If there is no evidence of immunity, females who are pregnant should delay immunization until  after pregnancy.  Pneumococcal 13-valent conjugate (PCV13) vaccine.** / Consult your health care provider.  Pneumococcal polysaccharide (PPSV23) vaccine.** / 1 to 2 doses if you smoke cigarettes or if you have certain conditions.  Meningococcal vaccine.** / Consult your health care provider.  Hepatitis A vaccine.** / Consult your health care provider.  Hepatitis B vaccine.** / Consult your health care provider.  Haemophilus influenzae type b (Hib) vaccine.** / Consult your health care provider. Ages 47 years and over  Blood pressure check.** / Every year.  Lipid and cholesterol check.** / Every 5 years beginning at age 80 years.  Lung cancer screening. / Every year if you are aged 63-80 years and have a 30-pack-year history of smoking and currently smoke or have quit within the past 15 years. Yearly screening is stopped once you have quit smoking for at least 15 years or develop a health problem that would prevent you from having lung cancer treatment.  Clinical breast exam.** / Every year after age 73 years.  BRCA-related cancer risk assessment.** / For women who have family members with a BRCA-related cancer (breast, ovarian, tubal, or peritoneal cancers).  Mammogram.** / Every year beginning at age 3 years and continuing for as long as you are in good health. Consult with your health care provider.  Pap test.** / Every 3 years starting at age 83 years through age 97 or 25 years with 3 consecutive normal Pap tests. Testing can be stopped between 65 and 70 years with 3 consecutive normal Pap tests and no abnormal Pap or HPV tests in the past 10 years.  HPV screening.** / Every 3 years from ages 52 years through ages 33 or 49 years with a history of 3 consecutive normal Pap tests. Testing can be stopped between 65 and 70 years with 3 consecutive normal Pap tests and no abnormal Pap or HPV tests in the past 10 years.  Fecal occult blood test (FOBT) of stool. / Every year beginning at  age 36 years and continuing until age 22 years. You may not need to do this test if you get a colonoscopy every 10 years.  Flexible sigmoidoscopy or colonoscopy.** / Every 5 years for a flexible sigmoidoscopy or every 10 years for a colonoscopy beginning at age 34 years and continuing until age 10 years.  Hepatitis C blood test.** / For all people born from [birth year] through 1964 and any individual with known risks for hepatitis C.  Osteoporosis screening.** / A one-time screening for women ages 4 years and over and women at risk for fractures or osteoporosis.  Skin self-exam. / Monthly.  Influenza vaccine. / Every year.  Tetanus, diphtheria, and acellular pertussis (Tdap/Td) vaccine.** / 1 dose of Td every 10 years.  Varicella vaccine.** / Consult your health care provider.  Zoster vaccine.** / 1 dose for adults aged 42 years or older.  Pneumococcal 13-valent conjugate (PCV13) vaccine.** / Consult your health care provider.  Pneumococcal polysaccharide (PPSV23) vaccine.** / 1 dose for all adults aged 92 years and older.  Meningococcal vaccine.** / Consult your health care provider.  Hepatitis A vaccine.** / Consult your health care provider.  Hepatitis B vaccine.** / Consult your health care provider.  Haemophilus influenzae type b (Hib) vaccine.** / Consult your health care provider. ** Family history and personal history of risk and conditions may change your health care provider's recommendations.   This information is not intended to replace advice given to you by your health care provider. Make sure you discuss any questions you have with your health care provider.   Document Released: 12/07/2001 Document Revised: 11/01/2014 Document Reviewed: 03/08/2011 Elsevier Interactive Patient Education [employer].

## 2016-06-07 NOTE — Telephone Encounter (Signed)
FMLA paperwork received.  Forms forwarded to PCP.

## 2016-06-10 ENCOUNTER — Other Ambulatory Visit (INDEPENDENT_AMBULATORY_CARE_PROVIDER_SITE_OTHER): Payer: BLUE CROSS/BLUE SHIELD

## 2016-06-10 DIAGNOSIS — Z0000 Encounter for general adult medical examination without abnormal findings: Secondary | ICD-10-CM

## 2016-06-10 LAB — COMPREHENSIVE METABOLIC PANEL
ALT: 12 U/L (ref 0–35)
AST: 15 U/L (ref 0–37)
Albumin: 4.3 g/dL (ref 3.5–5.2)
Alkaline Phosphatase: 88 U/L (ref 39–117)
BUN: 11 mg/dL (ref 6–23)
CO2: 25 mEq/L (ref 19–32)
Calcium: 9.3 mg/dL (ref 8.4–10.5)
Chloride: 103 mEq/L (ref 96–112)
Creatinine, Ser: 1.11 mg/dL (ref 0.40–1.20)
GFR: 68.61 mL/min (ref >60.00)
Glucose, Bld: 92 mg/dL (ref 70–99)
Potassium: 3.6 mEq/L (ref 3.5–5.1)
Sodium: 136 mEq/L (ref 135–145)
Total Bilirubin: 0.3 mg/dL (ref 0.2–1.2)
Total Protein: 7.3 g/dL (ref 6.0–8.3)

## 2016-06-10 LAB — CBC WITH DIFFERENTIAL/PLATELET
BASOS ABS: 0.1 10*3/uL (ref 0.0–0.1)
Basophils Relative: 0.9 % (ref 0.0–3.0)
Eosinophils Absolute: 0.2 10*3/uL (ref 0.0–0.7)
Eosinophils Relative: 2.9 % (ref 0.0–5.0)
HCT: 39.4 % (ref 36.0–46.0)
Hemoglobin: 12.7 g/dL (ref 12.0–15.0)
LYMPHS ABS: 2 10*3/uL (ref 0.7–4.0)
Lymphocytes Relative: 32.1 % (ref 12.0–46.0)
MCHC: 32.3 g/dL (ref 30.0–36.0)
MCV: 81 fl (ref 78.0–100.0)
MONO ABS: 0.5 10*3/uL (ref 0.1–1.0)
MONOS PCT: 8.7 % (ref 3.0–12.0)
NEUTROS ABS: 3.4 10*3/uL (ref 1.4–7.7)
NEUTROS PCT: 55.4 % (ref 43.0–77.0)
PLATELETS: 374 10*3/uL (ref 150.0–400.0)
RBC: 4.86 Mil/uL (ref 3.87–5.11)
RDW: 15.8 % — ABNORMAL HIGH (ref 11.5–15.5)
WBC: 6.1 10*3/uL (ref 4.0–10.5)

## 2016-06-10 LAB — LIPID PANEL
Cholesterol: 233 mg/dL — ABNORMAL HIGH (ref 0–200)
HDL: 57.7 mg/dL (ref >39.00)
LDL Cholesterol: 158 mg/dL — ABNORMAL HIGH (ref 0–99)
NonHDL: 175.64
Total CHOL/HDL Ratio: 4
Triglycerides: 86 mg/dL (ref 0.0–149.0)
VLDL: 17.2 mg/dL (ref 0.0–40.0)

## 2016-06-10 LAB — TSH: TSH: 1.64 u[IU]/mL (ref 0.35–4.50)

## 2016-06-14 NOTE — Telephone Encounter (Signed)
Patient called to follow up on FMLA papers left on June 04, 2016. Plse adv

## 2016-06-14 NOTE — Telephone Encounter (Signed)
I left a message on pts VM to call back and set up an office visit in the next 2 weeks so that [employer] can finish filling out her paperwork.

## 2016-06-15 NOTE — Telephone Encounter (Signed)
Patient scheduled for 06/17/2016 at 8am regarding message below.

## 2016-06-17 ENCOUNTER — Ambulatory Visit (INDEPENDENT_AMBULATORY_CARE_PROVIDER_SITE_OTHER): Payer: BLUE CROSS/BLUE SHIELD | Admitting: Medical

## 2016-06-17 VITALS — BP 130/92 | HR 74 | Temp 98.2°F | Ht 67.0 in | Wt 201.4 lb

## 2016-06-17 DIAGNOSIS — I10 Essential (primary) hypertension: Secondary | ICD-10-CM

## 2016-06-17 DIAGNOSIS — R51 Headache: Secondary | ICD-10-CM

## 2016-06-17 DIAGNOSIS — R519 Headache, unspecified: Secondary | ICD-10-CM

## 2016-06-17 MED ORDER — SUMATRIPTAN SUCCINATE 50 MG PO TABS: ORAL_TABLET | ORAL | 0 refills | Status: DC

## 2016-06-17 NOTE — Patient Instructions (Addendum)
For headache will prescribe imitrex in event you get recurrent HA. If ha increase frequency or different features then would likely get imaging.  For bp check daily and at weight loss clinic. Stop phentermine. Continue bp meds. Let me know on Friday what readings were later today on your med and at weight loss clinic. May need to increase bp med.  Follow up 1 month or as needed

## 2016-06-17 NOTE — Addendum Note (Signed)
Addended by: Anabel Halon on: 06/17/2016 08:50 AM   Modules accepted: Orders

## 2016-06-17 NOTE — Progress Notes (Signed)
Pre visit review using our clinic tool,if applicable. No additional management support is needed unless otherwise documented below in the visit note.  

## 2016-06-17 NOTE — Progress Notes (Signed)
Subjective:    Patient ID: Katie Merritt, female    DOB: 21-Oct-1971, 45 y.o.   MRN: [id number]  HPI  Pt in to fill of fmla form. She has history of intermittent HA since age of 45 yo. Gradual decrease until about 45 yo. Briefly increased then ha became less frequent. Pt states last 5-10 yr random rare ha. She can be out potentially up to 4 hours. At most in past up to 3 times a month. Pt when younger did not get work up(was not elevated). She used to get sounds and light sensitivity. Now ha when she gets do not cause nausea.   Pt states she thought early onset ha yesterday but stopped with alleve.   Last ha with light sensitivity was last month.  Pt bp is better today than last time. I refilled her blood pressure medication the other day. She is no longer on phentermine [place] for her weight loss. Her bp borderline today. Pt did not take her bp med this am.   Review of Systems  Constitutional: Negative for chills, fatigue and fever.  Neurological: Negative for dizziness, syncope, weakness, light-headedness and numbness.  Hematological: Negative for adenopathy. Does not bruise/bleed easily.  Psychiatric/Behavioral: Negative for behavioral problems, confusion, dysphoric mood and self-injury. The patient is not nervous/anxious.     Past Medical History:  Diagnosis Date  . Anxiety   . COPD (chronic obstructive pulmonary disease) (HCC)   . Hyperlipidemia   . Hypertension      Social History   Social History  . Marital status: Single    Spouse name: N/A  . Number of children: N/A  . Years of education: N/A   Occupational History  . Not on file.   Social History Main Topics  . Smoking status: Never Smoker  . Smokeless tobacco: Never Used  . Alcohol use 0.0 oz/week  . Drug use: Unknown  . Sexual activity: Not on file   Other Topics Concern  . Not on file   Social History Narrative  . No narrative on file    Past Surgical History:  Procedure Laterality Date  . Leap    .  TUBAL LIGATION      Family History  Problem Relation Age of Onset  . Hyperlipidemia Mother   . Hypertension Mother     No Known Allergies  Current Outpatient Prescriptions on File Prior to Visit  Medication Sig Dispense Refill  . Albuterol Sulfate (PROAIR HFA IN) Inhale 2 puffs into the lungs. 2 puffs as needed    . amLODipine-olmesartan (AZOR) 5-40 MG tablet Take 1 tablet by mouth daily. 30 tablet 2  . atorvastatin (LIPITOR) 80 MG tablet Take 80 mg by mouth at bedtime.    . phentermine 15 MG capsule Take 15 mg by mouth every morning.    . venlafaxine XR (EFFEXOR XR) 150 MG 24 hr capsule Take 1 capsule (150 mg total) by mouth daily with breakfast. 30 capsule 5  . ALPRAZolam (XANAX) 1 MG tablet 1/2 tablet-1 tablet  by mouth as needed 2 times daily. (Patient not taking: Reported on 06/04/2016) 30 tablet 0   No current facility-administered medications on file prior to visit.     BP (!) 130/92   Pulse 74   Temp 98.2 F (36.8 C) (Oral)   Ht 5\' 7"  (1.702 m)   Wt 201 lb 6.4 oz (91.4 kg)   SpO2 99%   BMI 31.54 kg/m       Objective:   Physical  Exam  General Mental Status- Alert. General Appearance- Not in acute distress.     Chest and Lung Exam Auscultation: Breath Sounds:-Normal.  Cardiovascular Auscultation:Rythm- Regular. Murmurs & Other Heart Sounds:Auscultation of the heart reveals- No Murmurs.   Neurologic Cranial Nerve exam:- CN III-XII intact(No nystagmus), symmetric smile. Strength:- 5/5 equal and symmetric strength both upper and lower extremities.      Assessment & Plan:  For headache will prescribe imitrex in event you get recurrent HA. If ha increase frequency or different features then would likely get imaging.  For bp check daily and at weight loss clinic. Stop phentermine. Continue bp meds. Let me know on Friday what readings were later today on your med and at weight loss clinic. May need to increase bp med.  Follow up 1 month or as  needed  Janin Kozlowski, Ramon Dredge, [employer]

## 2016-07-22 ENCOUNTER — Ambulatory Visit: Payer: BLUE CROSS/BLUE SHIELD | Admitting: Medical

## 2016-07-23 ENCOUNTER — Ambulatory Visit (INDEPENDENT_AMBULATORY_CARE_PROVIDER_SITE_OTHER): Payer: BLUE CROSS/BLUE SHIELD | Admitting: Medical

## 2016-07-23 ENCOUNTER — Telehealth: Payer: Self-pay | Admitting: Medical

## 2016-07-23 ENCOUNTER — Encounter: Payer: Self-pay | Admitting: Medical

## 2016-07-23 VITALS — BP 150/95 | HR 78 | Temp 98.2°F | Ht 67.0 in | Wt 201.2 lb

## 2016-07-23 DIAGNOSIS — I10 Essential (primary) hypertension: Secondary | ICD-10-CM

## 2016-07-23 DIAGNOSIS — E785 Hyperlipidemia, unspecified: Secondary | ICD-10-CM | POA: Diagnosis not present

## 2016-07-23 DIAGNOSIS — R519 Headache, unspecified: Secondary | ICD-10-CM

## 2016-07-23 DIAGNOSIS — R51 Headache: Secondary | ICD-10-CM

## 2016-07-23 DIAGNOSIS — E669 Obesity, unspecified: Secondary | ICD-10-CM | POA: Diagnosis not present

## 2016-07-23 NOTE — Patient Instructions (Addendum)
For your htn I want you to continue the azor. You did not take medication today so expect bp would have been better and maybe under 140/90.  Diet and exercise for weight loss. I am going to investigate possibility of using belviq for weight loss.  For your hyperlipidemia want to repeat lipid panel in November. You refused to take lipitor. I think would offer you low dose crestor.(please consider this as option)  Your ha are much improved. Continue to use imi trex for early onset migraine.  Follow up in 6 weeks or as needed  Please set up a nurse bp check over next 1-2 weeks while on azor.

## 2016-07-23 NOTE — Telephone Encounter (Signed)
Called patient left message for her to call back to schedule BP check.

## 2016-07-23 NOTE — Telephone Encounter (Signed)
Advised pt to schedule nurse bp check in 1-2 weeks. Will you call her and remind her/offer her flu vaccine that day she is in. I forgot to ask her today.

## 2016-07-23 NOTE — Progress Notes (Signed)
Pre visit review using our clinic tool,if applicable. No additional management support is needed unless otherwise documented below in the visit note.  

## 2016-07-23 NOTE — Progress Notes (Signed)
Subjective:    Patient ID: Katie Merritt, female    DOB: 09/10/1971, 45 y.o.   MRN: [id number]  HPI   Pt in for follow up.   Pt just ran out of her bp med azor. She has med available but forgot to pick it up. Did not take med today. No cardiac or neurologic signs or symptoms.  Pt stats she stopped going to weight loss clinic. Pt states she stopped the phentermine. It made her blood pressure increase. So she stopped.   Pt states her ha are controlled. Pt described migraine ha. Imitrex stops her ha early on with minimal side effects. 1-2 ha a week at most but med works very well. States has not had to take 2 tabs.    Pt has history of high cholesterol. Pt states never took lipitor.    Review of Systems  Constitutional: Negative for activity change, chills, fatigue and fever.  Respiratory: Negative for cough, chest tightness and wheezing.   Cardiovascular: Negative for chest pain and palpitations.  Gastrointestinal: Negative for abdominal pain, nausea and vomiting.  Musculoskeletal: Negative for neck pain and neck stiffness.  Neurological: Negative for dizziness, syncope, light-headedness and headaches.  Psychiatric/Behavioral: Negative for agitation, behavioral problems and confusion. The patient is not nervous/anxious.     Past Medical History:  Diagnosis Date  . Anxiety   . COPD (chronic obstructive pulmonary disease) (HCC)   . Hyperlipidemia   . Hypertension      Social History   Social History  . Marital status: Single    Spouse name: N/A  . Number of children: N/A  . Years of education: N/A   Occupational History  . Not on file.   Social History Main Topics  . Smoking status: Never Smoker  . Smokeless tobacco: Never Used  . Alcohol use 0.0 oz/week  . Drug use: Unknown  . Sexual activity: Not on file   Other Topics Concern  . Not on file   Social History Narrative  . No narrative on file    Past Surgical History:  Procedure Laterality Date  . Leap     . TUBAL LIGATION      Family History  Problem Relation Age of Onset  . Hyperlipidemia Mother   . Hypertension Mother     No Known Allergies  Current Outpatient Prescriptions on File Prior to Visit  Medication Sig Dispense Refill  . Albuterol Sulfate (PROAIR HFA IN) Inhale 2 puffs into the lungs. 2 puffs as needed    . amLODipine-olmesartan (AZOR) 5-40 MG tablet Take 1 tablet by mouth daily. 30 tablet 2  . atorvastatin (LIPITOR) 80 MG tablet Take 80 mg by mouth at bedtime.    . SUMAtriptan (IMITREX) 50 MG tablet 1 tab at onset of ha. Repeat 2 hours later if needed. Max tab in any 24 hour period is 2. 10 tablet 0  . venlafaxine XR (EFFEXOR XR) 150 MG 24 hr capsule Take 1 capsule (150 mg total) by mouth daily with breakfast. 30 capsule 5   No current facility-administered medications on file prior to visit.     Pulse 78   Temp 98.2 F (36.8 C) (Oral)   Ht 5\' 7"  (1.702 m)   Wt 201 lb 3.2 oz (91.3 kg)   LMP 07/09/2016 (Approximate)   SpO2 98%   BMI 31.51 kg/m      Objective:   Physical Exam  General Mental Status- Alert. General Appearance- Not in acute distress.   Skin General: Color-  Normal Color. Moisture- Normal Moisture.  Neck Carotid Arteries- Normal color. Moisture- Normal Moisture. No carotid bruits. No JVD.  Chest and Lung Exam Auscultation: Breath Sounds:-Normal.  Cardiovascular Auscultation:Rythm- Regular. Murmurs & Other Heart Sounds:Auscultation of the heart reveals- No Murmurs.   Neurologic Cranial Nerve exam:- CN III-XII intact(No nystagmus), symmetric smile. Strength:- 5/5 equal and symmetric strength both upper and lower extremities.      Assessment & Plan:  For your htn I want you to continue the azor. You did not take medication today so expect bp would have been better and maybe under 140/90.  Diet and exercise for weight loss. I am going to investigate possibility of using belviq for weight loss.  For your hyperlipidemia want to  repeat lipid panel in November. You refused to take lipitor. I think would offer you low dose crestor.(please consider this as option)  Your ha are much improved. Continue to use imitrex for early onset migraine.  Follow up in 6 weeks or as needed  Denese Mentink, Ramon Dredge, [employer]

## 2016-07-29 ENCOUNTER — Ambulatory Visit (INDEPENDENT_AMBULATORY_CARE_PROVIDER_SITE_OTHER): Payer: BLUE CROSS/BLUE SHIELD | Admitting: Medical

## 2016-07-29 VITALS — BP 113/72 | HR 92

## 2016-07-29 DIAGNOSIS — I10 Essential (primary) hypertension: Secondary | ICD-10-CM | POA: Diagnosis not present

## 2016-07-29 NOTE — Patient Instructions (Signed)
Per Mackie Pai, PA-C: Continue current medication regimen. No follow-up necessary at this time.

## 2016-07-29 NOTE — Progress Notes (Signed)
Pre visit review using our clinic review tool, if applicable. No additional management support is needed unless otherwise documented below in the visit note.  Patient presents in office for blood pressure check per OV note 07/23/16. Reviewed medication and regimen with the patient. Today's reading was BP 113/72 P 92.  Per Mackie Pai, PA-C: Continue current medication regimen. No follow-up necessary at this time.  Informed patient of the provider's instructions. She verbalized understanding and did not have any further questions or concerns before leaving the nurse visit.  Discussed with RN pt bp and advised continue current bp med regimen. Last visit her bp was higher but she had not taken her tab that day.

## 2016-07-30 ENCOUNTER — Telehealth: Payer: Self-pay | Admitting: Medical

## 2016-07-30 ENCOUNTER — Encounter: Payer: Self-pay | Admitting: Medical

## 2016-07-30 MED ORDER — LORCASERIN HCL 10 MG PO TABS: ORAL_TABLET | ORAL | 0 refills | Status: DC

## 2016-07-30 NOTE — Telephone Encounter (Signed)
Will you let pt know I decided to rx belviq for weigh loss. Follow up in one month after starting med. Will see if insurance covers.

## 2016-07-30 NOTE — Telephone Encounter (Signed)
Called patient. Left message for return call on answering machine.

## 2016-07-30 NOTE — Telephone Encounter (Signed)
Faxed Rx to Pharmacy listed, unable to reach patient by phone.

## 2016-08-03 ENCOUNTER — Encounter: Payer: Self-pay | Admitting: Medical

## 2016-08-03 NOTE — Telephone Encounter (Signed)
Spoke with patient today gegarding results and current condition.

## 2016-08-04 NOTE — Telephone Encounter (Signed)
Pt saw Percell Miller on 07/29/16.

## 2016-08-10 ENCOUNTER — Encounter: Payer: Self-pay | Admitting: Medical

## 2016-08-10 NOTE — Progress Notes (Unsigned)
Submitted PA for patient medication Belviq online.Awaiting response.

## 2016-08-13 ENCOUNTER — Telehealth: Payer: Self-pay

## 2016-08-13 NOTE — Telephone Encounter (Signed)
Called Cover My Meds. States according to [facility] patient does not have coverege through them any longer. Attempted to call patient to verify,phone number dialed not in service.

## 2016-08-26 ENCOUNTER — Encounter: Payer: Self-pay | Admitting: Medical

## 2016-09-06 ENCOUNTER — Encounter: Payer: Self-pay | Admitting: Medical

## 2016-09-10 ENCOUNTER — Encounter: Payer: Self-pay | Admitting: Medical

## 2016-09-21 NOTE — Telephone Encounter (Signed)
I filled out prior authorization form. Pt also sent my chart message with some information on authorization process. I will give you the form to fax.

## 2016-09-23 ENCOUNTER — Encounter: Payer: Self-pay | Admitting: Medical

## 2016-09-23 NOTE — Telephone Encounter (Signed)
  Barnet Pall,  This is my chart message pt sent me.  To initiate this process, please have your physician call [phone]. Your physician may also visit [email or web address] to initiate, complete, renew, and view prior authorizations. You can verify with [employer] that the prior authorization has been approved with [employer]. Once the prior authorization has been approved, please contact your local pharmacy and let them know the prior authorization has been approved. They will need to re-run the prescription on or after the effective date of the prior authorization. If they have any trouble processing this prescription, please have them call our pharmacy help desk at [phone].  She needs to be aware if he insurance covers Belviq it will be writtin for 30 tabs with one refill(or two at most). Limited time on med. I can send it to her mail order pharmacy as 30 tab with one refill per [place] stat law.   Question is will her insurance cover and we already filled out paper work. Does she still want [place] to got though the above process in light of it being short term med?  Wynonna Fitzhenry, Percell Miller, PA-C

## 2016-09-24 NOTE — Telephone Encounter (Signed)
I left a message for the patient to call back about the Belviq Rx. Katie Merritt has a couple of questions for the patient.

## 2016-09-26 ENCOUNTER — Other Ambulatory Visit: Payer: Self-pay | Admitting: Medical

## 2016-09-27 ENCOUNTER — Other Ambulatory Visit: Payer: Self-pay | Admitting: Medical

## 2016-09-29 MED ORDER — SUMATRIPTAN SUCCINATE 50 MG PO TABS: 50.0000 mg | ORAL_TABLET | ORAL | 0 refills | Status: DC | PRN

## 2016-09-29 NOTE — Telephone Encounter (Signed)
I printed out imitrex and will sign. Someone loaded lorcaserin. This is the med that we have been trying to get authorized. So I did not sign rx again as don't know what is going on with prior rx that I already signed. Any updates. Will you get update directly from patient.

## 2016-09-29 NOTE — Telephone Encounter (Signed)
Please advise.  Patient has not called the office back about her medications.

## 2016-09-30 NOTE — Telephone Encounter (Signed)
Patient called regarding a mychart message. Please advise  patient phone:  [phone]

## 2016-10-05 NOTE — Telephone Encounter (Signed)
I reviewed the forms that were in my folders. I made some addendums to some answers and signed/dated. I did not see any other new form. Will you fax this over again. I don't see any new form that she dropped off.

## 2016-10-05 NOTE — Telephone Encounter (Signed)
Patient dropped off PA form for the following medication mentioned below, apologize to patient for having to physical come in to the office and drop off form

## 2016-10-06 NOTE — Telephone Encounter (Signed)
I spoke with Katie Merritt  at [place] and she started the process for the prior authorization for Belviq 10 mg .for this patient.   I was then transferred to [name] who starts the process for prior authorizations at [employer]. The medication was denied and will not be covered by the patients insurance.   The medications that are covered are as follows:  Phentermine, and Deithpropion HCl  Please advise.   Pt has been notified as well.

## 2016-10-08 ENCOUNTER — Other Ambulatory Visit: Payer: Self-pay | Admitting: Medical

## 2016-10-11 NOTE — Telephone Encounter (Signed)
rx refill azor

## 2016-10-13 NOTE — Telephone Encounter (Signed)
Called patient at both numbers listed. Left a message for call back on work number.  Since patient is active on MyChart, also sent a Mychart message making her aware that we have been trying to reach her.

## 2016-10-13 NOTE — Telephone Encounter (Signed)
Katie Merritt, Would you call pt and upate her.  I tried to call pt today on he cell and her work phone no answer. I wanted to talk with her about her weight loss. We had been trying to get belviq for her. Katie Merritt and I had filled out and sent various prior authorization forms. Over various weeks. Then in the end they denied belviq  They did approve a med but another type that  could raise her bp and pulse. She has htn. In fact phentermine was tried by weight loss clinic and her bp did go up.   I wanted to touch base with her and apologize that we were not able to get prior authorization. I kinda feel insurance strung [place] along and could just have been matter of fact and told us not covered. I was frustrate and can imagine how she feel.   There is another medication called saxenda. I could talk with her on her next follow bp check. I don't know if she would qualify.   For example insurance told us with belviq her bmi needed to be 106.   For   belviq pharmaceutical rep states bmi has to be thirty for [employer].  So we can keep trying.

## 2016-10-14 NOTE — Telephone Encounter (Signed)
Patient returned my chart message updated contact information best # [phone]

## 2016-10-15 NOTE — Telephone Encounter (Signed)
Pt notified of instructions and verbalized understanding. She was appreciative of call. Follow-up appt scheduled 10/29/16-30 min appt to allow time for discussion of medication. No further needs at this time.

## 2016-10-29 ENCOUNTER — Telehealth: Payer: Self-pay

## 2016-10-29 ENCOUNTER — Ambulatory Visit (INDEPENDENT_AMBULATORY_CARE_PROVIDER_SITE_OTHER): Payer: BLUE CROSS/BLUE SHIELD | Admitting: Medical

## 2016-10-29 ENCOUNTER — Encounter: Payer: Self-pay | Admitting: Medical

## 2016-10-29 VITALS — BP 140/80 | HR 85 | Temp 98.1°F | Resp 16 | Ht 67.0 in | Wt 204.1 lb

## 2016-10-29 DIAGNOSIS — E669 Obesity, unspecified: Secondary | ICD-10-CM

## 2016-10-29 DIAGNOSIS — Z6830 Body mass index (BMI) 30.0-30.9, adult: Secondary | ICD-10-CM

## 2016-10-29 DIAGNOSIS — I10 Essential (primary) hypertension: Secondary | ICD-10-CM

## 2016-10-29 MED ORDER — AMLODIPINE-OLMESARTAN 5-40 MG PO TABS: 1.0000 | ORAL_TABLET | Freq: Every day | ORAL | 2 refills | Status: DC

## 2016-10-29 MED ORDER — LIRAGLUTIDE -WEIGHT MANAGEMENT 18 MG/3ML ~~LOC~~ SOPN: 0.6000 mg | PEN_INJECTOR | SUBCUTANEOUS | 2 refills | Status: AC

## 2016-10-29 MED FILL — ULTICARE PEN NDL 8MM 31G: 31G X 8 MM | 30 days supply | Qty: 100 | Fill #0

## 2016-10-29 MED FILL — SAXENDA 18 MG/3 ML PEN: 18 | 30 days supply | Qty: 15 | Fill #0

## 2016-10-29 NOTE — Patient Instructions (Addendum)
Your bp is moderately controlled today. I refilled your azor.  Regarding weight loss recommend diet and exercise. Try weight watcher point system or equivalent.  Saxenda rx. Rx printed can review and decide if you want to fill. May try our pharmacy as sometimes they give me quicker feedback on prior authorization issues.  Follow up in one month or as needed

## 2016-10-29 NOTE — Telephone Encounter (Signed)
PA initiated via Covermymeds; KEY: [id number]. Received real-time PA approval. Your request has been approved  [id number] Name:ST: Weight loss drugs - Saxenda -*[employer]*;Status:Approved;Coverage Start Date:09/29/2016;Coverage End Date:02/26/2017. [facility] informed.

## 2016-10-29 NOTE — Progress Notes (Signed)
Pre visit review using our clinic review tool, if applicable. No additional management support is needed unless otherwise documented below in the visit note/SLS  

## 2016-10-29 NOTE — Progress Notes (Signed)
Subjective:    Patient ID: Katie Merritt, female    DOB: 1971/05/01, 46 y.o.   MRN: [id number]  HPI  Pt in for follow up on weight loss. We went through extended prior authorization process. Then insurance denied since bmi was not high enough. Believe bmi needed to be 35 to get belviq.  They only gave one option and had similar effects to phentermine. (but that increased her bp)  Pt bp is decently controlled today. At times have been higher in the past.  Pt has never tried saxenda. Failed phentermine due to bp increase. Belvique was not covered.  Pt is still exercising but not much. Member of gym. Goes twice a week. Pt diet program described as gone out the window. Poor diet. Eats a lot at night.  No family hx of thryoid cancer and no history of pancreatitis.    Review of Systems  Constitutional: Negative for chills, fatigue and fever.  Respiratory: Negative for cough, chest tightness, shortness of breath and wheezing.   Cardiovascular: Negative for chest pain and palpitations.  Gastrointestinal: Negative for abdominal pain.  Genitourinary: Negative for dysuria.  Musculoskeletal: Negative for back pain.  Skin: Negative for rash.  Neurological: Negative for dizziness and headaches.  Hematological: Negative for adenopathy. Does not bruise/bleed easily.  Psychiatric/Behavioral: Negative for behavioral problems.    Past Medical History:  Diagnosis Date  . Anxiety   . COPD (chronic obstructive pulmonary disease) (HCC)   . Hyperlipidemia   . Hypertension      Social History   Social History  . Marital status: Single    Spouse name: N/A  . Number of children: N/A  . Years of education: N/A   Occupational History  . Not on file.   Social History Main Topics  . Smoking status: Former Smoker    Quit date: 11/25/2013  . Smokeless tobacco: Never Used  . Alcohol use 0.0 oz/week     Comment: Light Smoker Only  . Drug use: Unknown  . Sexual activity: Not on file   Other  Topics Concern  . Not on file   Social History Narrative  . No narrative on file    Past Surgical History:  Procedure Laterality Date  . Leap    . TUBAL LIGATION      Family History  Problem Relation Age of Onset  . Hyperlipidemia Mother   . Hypertension Mother     No Known Allergies  Current Outpatient Prescriptions on File Prior to Visit  Medication Sig Dispense Refill  . SUMAtriptan (IMITREX) 50 MG tablet TAKE 1 TABLET BY MOUTH AT ONSET OF HEADACHE. REPEAT IN 2 HOURS IF NEEDED. MAX 2 TABLETS IN 24 HOURS 10 tablet 0  . atorvastatin (LIPITOR) 80 MG tablet Take 80 mg by mouth at bedtime.     No current facility-administered medications on file prior to visit.     BP 140/80   Pulse 85   Temp 98.1 F (36.7 C) (Oral)   Resp 16   Ht 5\' 7"  (1.702 m)   Wt 204 lb 2 oz (92.6 kg)   LMP 10/21/2016   SpO2 100%   BMI 31.97 kg/m       Objective:   Physical Exam    General Mental Status- Alert. General Appearance- Not in acute distress.   Skin General: Color- Normal Color. Moisture- Normal Moisture.  Neck Carotid Arteries- Normal color. Moisture- Normal Moisture. No carotid bruits. No JVD.(no thyromgaly)  Chest and Lung Exam Auscultation: Breath Sounds:-Normal.  Cardiovascular Auscultation:Rythm- Regular. Murmurs & Other Heart Sounds:Auscultation of the heart reveals- No Murmurs.  Abdomen Inspection:-Inspeection Normal. Palpation/Percussion:Note:No mass. Palpation and Percussion of the abdomen reveal- Non Tender, Non Distended + BS, no rebound or guarding.  Neurologic Cranial Nerve exam:- CN III-XII intact(No nystagmus), symmetric smile. Strength:- 5/5 equal and symmetric strength both upper and lower extremities.      Assessment & Plan:  Your bp is moderately controlled today. I refilled your azor.  Regarding weight loss recommend diet and exercise. Try weight watcher point system or equivalent.  Saxenda rx. Rx printed can review and decide if you want  to fill. May try our pharmacy as sometimes they give me quicker feedback on prior authorization issues.  Follow up in one month or as needed  Everline Mahaffy, Ramon Dredge, [place]

## 2016-12-03 ENCOUNTER — Other Ambulatory Visit: Payer: Self-pay | Admitting: Medical

## 2016-12-07 MED ORDER — SUMATRIPTAN SUCCINATE 50 MG PO TABS: ORAL_TABLET | ORAL | 0 refills | Status: DC

## 2016-12-07 NOTE — Addendum Note (Signed)
Addended by: Kelle Darting A on: 12/07/2016 03:13 PM   Modules accepted: Orders

## 2016-12-09 ENCOUNTER — Telehealth: Payer: Self-pay | Admitting: Medical

## 2016-12-09 ENCOUNTER — Encounter: Payer: Self-pay | Admitting: Medical

## 2016-12-09 ENCOUNTER — Ambulatory Visit (INDEPENDENT_AMBULATORY_CARE_PROVIDER_SITE_OTHER): Payer: BLUE CROSS/BLUE SHIELD | Admitting: Medical

## 2016-12-09 VITALS — BP 136/89 | HR 86 | Temp 98.5°F | Ht 65.0 in | Wt 194.8 lb

## 2016-12-09 DIAGNOSIS — E669 Obesity, unspecified: Secondary | ICD-10-CM

## 2016-12-09 DIAGNOSIS — Z6830 Body mass index (BMI) 30.0-30.9, adult: Secondary | ICD-10-CM | POA: Diagnosis not present

## 2016-12-09 MED ORDER — SAXENDA 18 MG/3ML ~~LOC~~ SOPN: 3.0000 mg | PEN_INJECTOR | Freq: Every day | SUBCUTANEOUS | 2 refills | Status: DC

## 2016-12-09 MED FILL — SAXENDA 18 MG/3 ML PEN: 18 | 30 days supply | Qty: 15 | Fill #0

## 2016-12-09 NOTE — Patient Instructions (Signed)
For your weight loss I am refilling the saxenda at the 3 mg daily dose. Continue to diet and exercise. I did write 2 refills.   For you nasal congestion use flonase. If you have worse infection type symptoms let us know.  Follow up in 2 months or as needed

## 2016-12-09 NOTE — Progress Notes (Signed)
Subjective:    Patient ID: Nelson Chimes, female    DOB: 10/20/1971, 46 y.o.   MRN: [id number]  HPI   Pt in for follow up.   Pt is on saxenda now. Pt states drinking more water. She states that appetite less and she feels fuller quicker. Eating healthier. Pt is tyryng to exercise when weather nice. She has lost 10 pounds.  Pt got her rx and she had 5 pens. She has one pen left.    Review of Systems  Constitutional: Negative for chills, fatigue and fever.  HENT: Positive for congestion. Negative for ear pain, postnasal drip, rhinorrhea, sinus pain, sneezing and sore throat.        Faint nasal congestion for one day.  Respiratory: Negative for cough, chest tightness, shortness of breath and wheezing.   Cardiovascular: Negative for chest pain and palpitations.  Gastrointestinal: Negative for abdominal pain.  Genitourinary: Negative for difficulty urinating, dysuria, flank pain and frequency.  Musculoskeletal: Negative for back pain and gait problem.  Skin: Negative for rash.  Neurological: Negative for dizziness and headaches.  Hematological: Negative for adenopathy. Does not bruise/bleed easily.  Psychiatric/Behavioral: Negative for behavioral problems and confusion.    Past Medical History:  Diagnosis Date  . Anxiety   . COPD (chronic obstructive pulmonary disease) (HCC)   . Hyperlipidemia   . Hypertension      Social History   Social History  . Marital status: Single    Spouse name: N/A  . Number of children: N/A  . Years of education: N/A   Occupational History  . Not on file.   Social History Main Topics  . Smoking status: Former Smoker    Quit date: 11/25/2013  . Smokeless tobacco: Never Used  . Alcohol use 0.0 oz/week     Comment: Light Smoker Only  . Drug use: Unknown  . Sexual activity: Not on file   Other Topics Concern  . Not on file   Social History Narrative  . No narrative on file    Past Surgical History:  Procedure Laterality Date  .  Leap    . TUBAL LIGATION      Family History  Problem Relation Age of Onset  . Hyperlipidemia Mother   . Hypertension Mother     No Known Allergies  Current Outpatient Prescriptions on File Prior to Visit  Medication Sig Dispense Refill  . amLODipine-olmesartan (AZOR) 5-40 MG tablet Take 1 tablet by mouth daily. 30 tablet 2  . atorvastatin (LIPITOR) 80 MG tablet Take 80 mg by mouth at bedtime.    . cholecalciferol (VITAMIN D) 400 units TABS tablet Take 400 Units by mouth daily.    . Multiple Vitamins-Minerals (WOMENS MULTIVITAMIN PO) Take by mouth daily.    . SUMAtriptan (IMITREX) 50 MG tablet TAKE 1 TABLET BY MOUTH AT ONSET OF HEADACHE. REPEAT IN 2 HOURS IF NEEDED. MAX 2 TABLETS IN 24 HOURS 9 tablet 0   No current facility-administered medications on file prior to visit.     BP 136/89   Pulse 86   Temp 98.5 F (36.9 C) (Oral)   Ht 5\' 5"  (1.651 m)   Wt 194 lb 12.8 oz (88.4 kg)   LMP 12/09/2016   SpO2 100%   BMI 32.42 kg/m       Objective:   Physical Exam   General- No acute distress. Pleasant patient. Neck- Full range of motion, no jvd. No thyromegaly.  Lungs- Clear, even and unlabored. Heart- regular rate and rhythm.  Neurologic- CNII- XII grossly intact.  Abdomen- soft, nt, nd, +bs, no organomegaly.   Back-no cva tenderness      Assessment & Plan:  For your weight loss I am refilling the saxenda at the 3 mg daily dose. Continue to diet and exercise. I did write 2 refills.   For you nasal congestion use flonase. If you have worse infection type symptoms let us know.  Follow up in 2 months or as needed  Tried to call pharmacy to see if he had refills from last rx. I wrote sig on current rx as 3.0 mg daily. Epic converted to 3ml daily? Pt aware of what dose she is on.  Alzina Golda, Ramon Dredge, PA-C

## 2016-12-09 NOTE — Telephone Encounter (Signed)
Opened to review 

## 2016-12-09 NOTE — Progress Notes (Signed)
Pre visit review using our clinic review tool, if applicable. No additional management support is needed unless otherwise documented below in the visit note. 

## 2017-01-21 ENCOUNTER — Other Ambulatory Visit: Payer: Self-pay | Admitting: Medical

## 2017-01-21 MED FILL — SAXENDA 18 MG/3 ML PEN: 18 | 30 days supply | Qty: 15 | Fill #1

## 2017-01-26 ENCOUNTER — Encounter: Payer: Self-pay | Admitting: Medical

## 2017-01-26 ENCOUNTER — Other Ambulatory Visit (HOSPITAL_COMMUNITY)
Admission: RE | Admit: 2017-01-26 | Discharge: 2017-01-26 | Disposition: A | Discharge status: Home / Self Care | Payer: BLUE CROSS/BLUE SHIELD | Source: Ambulatory Visit | Attending: Medical | Admitting: Medical

## 2017-01-26 ENCOUNTER — Ambulatory Visit (INDEPENDENT_AMBULATORY_CARE_PROVIDER_SITE_OTHER): Payer: BLUE CROSS/BLUE SHIELD | Admitting: Medical

## 2017-01-26 VITALS — BP 145/95 | HR 87 | Temp 98.5°F | Resp 16 | Ht 65.0 in | Wt 196.4 lb

## 2017-01-26 DIAGNOSIS — N898 Other specified noninflammatory disorders of vagina: Secondary | ICD-10-CM | POA: Insufficient documentation

## 2017-01-26 DIAGNOSIS — R300 Dysuria: Secondary | ICD-10-CM

## 2017-01-26 LAB — POC URINALSYSI DIPSTICK (AUTOMATED)
BILIRUBIN UA: NEGATIVE
Blood, UA: NEGATIVE
Glucose, UA: NEGATIVE
KETONES UA: NEGATIVE
Nitrite, UA: NEGATIVE
PH UA: 6 (ref 5.0–8.0)
Protein, UA: NEGATIVE
SPEC GRAV UA: 1.025 (ref 1.030–1.035)
Urobilinogen, UA: NEGATIVE (ref ?–2.0)

## 2017-01-26 MED ORDER — CIPROFLOXACIN HCL 250 MG PO TABS: 250.0000 mg | ORAL_TABLET | Freq: Two times a day (BID) | ORAL | 0 refills | Status: DC

## 2017-01-26 MED FILL — CIPROFLOXACIN HCL 250 MG TA: 250 | 3 days supply | Qty: 6 | Fill #0

## 2017-01-26 NOTE — Progress Notes (Signed)
Subjective:    Patient ID: Katie Merritt, female    DOB: Oct 27, 1970, 46 y.o.   MRN: [id number]  HPI   Pt in today reporting urinary symptoms for 3-4 days.  Dysuria- slight pain on urination. Frequent urination- mild and has slight urge to go at time. Hesitancy-no Suprapubic pressure-faint Fever-no chills-no Nausea-no Vomiting-no CVA pain-some mild rt side(but she states moving it into her house.) History of UTI-Not frequent/rare Gross hematuria- no.  Slight white dc. Hx of bv in the past.  LMP-3 weeks. Pt tubes tied.   Review of Systems  Constitutional: Negative for chills, fatigue and fever.  Respiratory: Negative for cough, chest tightness, shortness of breath and wheezing.   Cardiovascular: Negative for chest pain and palpitations.  Gastrointestinal: Positive for abdominal pain. Negative for constipation, nausea and vomiting.  Genitourinary: Positive for dysuria, frequency and vaginal pain. Negative for difficulty urinating, flank pain, menstrual problem, urgency and vaginal discharge.       Maybe faint white dc.  Musculoskeletal: Positive for back pain. Negative for myalgias and neck pain.  Neurological: Negative for dizziness, syncope and headaches.  Hematological: Negative for adenopathy. Does not bruise/bleed easily.  Psychiatric/Behavioral: Negative for behavioral problems and confusion.    Past Medical History:  Diagnosis Date  . Anxiety   . COPD (chronic obstructive pulmonary disease) (HCC)   . Hyperlipidemia   . Hypertension      Social History   Social History  . Marital status: Single    Spouse name: N/A  . Number of children: N/A  . Years of education: N/A   Occupational History  . Not on file.   Social History Main Topics  . Smoking status: Former Smoker    Quit date: 11/25/2013  . Smokeless tobacco: Never Used  . Alcohol use 0.0 oz/week     Comment: Light Smoker Only  . Drug use: Unknown  . Sexual activity: Not on file   Other Topics  Concern  . Not on file   Social History Narrative  . No narrative on file    Past Surgical History:  Procedure Laterality Date  . Leap    . TUBAL LIGATION      Family History  Problem Relation Age of Onset  . Hyperlipidemia Mother   . Hypertension Mother     No Known Allergies  Current Outpatient Prescriptions on File Prior to Visit  Medication Sig Dispense Refill  . amLODipine-olmesartan (AZOR) 5-40 MG tablet Take 1 tablet by mouth daily. 30 tablet 2  . atorvastatin (LIPITOR) 80 MG tablet Take 80 mg by mouth at bedtime.    . Multiple Vitamins-Minerals (WOMENS MULTIVITAMIN PO) Take by mouth daily.    Marland Kitchen SAXENDA 18 MG/3ML SOPN Inject 3 mg as directed daily. 5 pen 2  . SUMAtriptan (IMITREX) 50 MG tablet TAKE 1 TABLET BY MOUTH AT ONSET OF HEADACHE. REPEAT IN 2 HOURS IF NEEDED. MAX 2 TABLETS IN 24 HOURS 9 tablet 0   No current facility-administered medications on file prior to visit.     BP (!) 140/101 (BP Location: Left Arm, Patient Position: Sitting, Cuff Size: Large)   Pulse 87   Temp 98.5 F (36.9 C) (Oral)   Resp 16   Ht 5\' 5"  (1.651 m)   Wt 196 lb 6.4 oz (89.1 kg)   SpO2 100%   BMI 32.68 kg/m       Objective:   Physical Exam  General Appearance- Not in acute distress.  HEENT Eyes- Scleraeral/Conjuntiva-bilat- Not Yellow. Mouth &  Throat- Normal.  Chest and Lung Exam Auscultation: Breath sounds:-Normal. Adventitious sounds:- No Adventitious sounds.  Cardiovascular Auscultation:Rythm - Regular. Heart Sounds -Normal heart sounds.  Abdomen Inspection:-Inspection Normal.  Palpation/Perucssion: Palpation and Percussion of the abdomen reveal- faint suprapubic Tender, No Rebound tenderness, No rigidity(Guarding) and No Palpable abdominal masses.  Liver:-Normal.  Spleen:- Normal.   Back- faint rt cva tenderness. (no pain on twisting of her back)      Assessment & Plan:  You appear to have a urinary tract infection. I am prescribing  cipro(low  dose)antibiotic for the probable infection pending urine culture. Hydrate well. I am sending out a urine culture and urine ancillary studies. During the interim if your signs and symptoms worsen rather than improving please notify us. We will notify your when the culture results are back.  Follow up in 7 days or as needed.  Asked to get back on bp med.  Francies Inch, Ramon Dredge, PA-C

## 2017-01-26 NOTE — Progress Notes (Signed)
Pre visit review using our clinic review tool, if applicable. No additional management support is needed unless otherwise documented below in the visit note. 

## 2017-01-26 NOTE — Patient Instructions (Addendum)
You appear to have a urinary tract infection. I am prescribing  cipro(low dose)antibiotic for the probable infection pending urine culture. Hydrate well. I am sending out a urine culture and urine ancillary studies. During the interim if your signs and symptoms worsen rather than improving please notify us. We will notify your when the culture results are back.  Follow up in 7 days or as needed.  Please get back on bp med.

## 2017-01-27 LAB — URINE CULTURE: Organism ID, Bacteria: NO GROWTH

## 2017-01-27 LAB — URINE CYTOLOGY ANCILLARY ONLY
CHLAMYDIA, DNA PROBE: NEGATIVE
Neisseria Gonorrhea: NEGATIVE
Trichomonas: NEGATIVE

## 2017-01-28 ENCOUNTER — Other Ambulatory Visit: Payer: Self-pay

## 2017-01-28 MED ORDER — SAXENDA 18 MG/3ML ~~LOC~~ SOPN: 3.0000 mg | PEN_INJECTOR | Freq: Every day | SUBCUTANEOUS | 2 refills | Status: DC

## 2017-01-31 ENCOUNTER — Encounter: Payer: Self-pay | Admitting: Medical

## 2017-01-31 LAB — URINE CYTOLOGY ANCILLARY ONLY: CANDIDA VAGINITIS: NEGATIVE

## 2017-01-31 MED ORDER — METRONIDAZOLE 500 MG PO TABS: 500.0000 mg | ORAL_TABLET | Freq: Two times a day (BID) | ORAL | 0 refills | Status: DC

## 2017-01-31 NOTE — Telephone Encounter (Signed)
rx sent to pharmacy for flagyl.

## 2017-02-01 MED FILL — metroNIDAZOLE 500 MG TABS: 500 | 7 days supply | Qty: 14 | Fill #0

## 2017-02-01 MED FILL — ULTICARE PEN NDL 8MM 31G: 31G X 8 MM | 30 days supply | Qty: 100 | Fill #0

## 2017-02-02 NOTE — Telephone Encounter (Signed)
Did you try pt home and mobile  Phone number?

## 2017-02-08 ENCOUNTER — Encounter: Payer: Self-pay | Admitting: Medical

## 2017-02-10 MED ORDER — FLUCONAZOLE 150 MG PO TABS: ORAL_TABLET | ORAL | 0 refills | Status: DC

## 2017-02-10 MED FILL — FLUCONAZOLE 150 MG TABLET: 150 | 2 days supply | Qty: 2 | Fill #0

## 2017-02-14 NOTE — Telephone Encounter (Signed)
Received PA re-verification from [facility] for Saxenda. PA initiated via Covermymeds; KEY: [id number]. Awaiting determination.

## 2017-02-16 NOTE — Telephone Encounter (Signed)
PA approved through 02/16/2018. Case ID: [id number].

## 2017-03-03 MED FILL — SAXENDA 18 MG/3 ML PEN: 18 | 30 days supply | Qty: 15 | Fill #0

## 2017-03-09 ENCOUNTER — Other Ambulatory Visit: Payer: Self-pay

## 2017-03-09 ENCOUNTER — Telehealth: Payer: Self-pay

## 2017-03-09 MED ORDER — AMLODIPINE-OLMESARTAN 5-40 MG PO TABS: 1.0000 | ORAL_TABLET | Freq: Every day | ORAL | 1 refills | Status: DC

## 2017-03-09 MED ORDER — SUMATRIPTAN SUCCINATE 50 MG PO TABS: ORAL_TABLET | ORAL | 1 refills | Status: DC

## 2017-03-09 NOTE — Telephone Encounter (Signed)
I signed pt imitrex. Will you check to make sure it went to pt pharmacy successfully. Sometimes imitrex does not go through or prints.

## 2017-03-09 NOTE — Telephone Encounter (Signed)
Pt requesting 90 day supply on Sumatriptan.Please advise.

## 2017-04-19 MED FILL — SAXENDA 18 MG/3 ML PEN: 18 | 30 days supply | Qty: 15 | Fill #1

## 2017-05-18 ENCOUNTER — Other Ambulatory Visit: Payer: Self-pay | Admitting: Medical

## 2017-05-18 ENCOUNTER — Encounter: Payer: Self-pay | Admitting: Medical

## 2017-05-19 ENCOUNTER — Encounter: Payer: Self-pay | Admitting: Medical

## 2017-05-20 ENCOUNTER — Telehealth: Payer: Self-pay | Admitting: Medical

## 2017-05-20 MED ORDER — AMLODIPINE-OLMESARTAN 5-40 MG PO TABS: 1.0000 | ORAL_TABLET | Freq: Every day | ORAL | 1 refills | Status: DC

## 2017-05-20 MED ORDER — METRONIDAZOLE 500 MG PO TABS: 500.0000 mg | ORAL_TABLET | Freq: Three times a day (TID) | ORAL | 0 refills | Status: DC

## 2017-05-20 NOTE — Telephone Encounter (Signed)
rx refill of her bp med and flagyl. Will advise pt. If her symptoms don't improve then office visit and urine ancillary studies.

## 2017-06-02 ENCOUNTER — Encounter: Payer: Self-pay | Admitting: Medical

## 2017-06-07 ENCOUNTER — Ambulatory Visit (INDEPENDENT_AMBULATORY_CARE_PROVIDER_SITE_OTHER): Payer: BLUE CROSS/BLUE SHIELD | Admitting: Medical

## 2017-06-07 ENCOUNTER — Encounter: Payer: Self-pay | Admitting: Medical

## 2017-06-07 VITALS — BP 140/88 | HR 90 | Temp 98.2°F | Resp 16 | Ht 65.0 in | Wt 183.0 lb

## 2017-06-07 DIAGNOSIS — I10 Essential (primary) hypertension: Secondary | ICD-10-CM | POA: Diagnosis not present

## 2017-06-07 DIAGNOSIS — E669 Obesity, unspecified: Secondary | ICD-10-CM

## 2017-06-07 DIAGNOSIS — D179 Benign lipomatous neoplasm, unspecified: Secondary | ICD-10-CM

## 2017-06-07 DIAGNOSIS — R519 Headache, unspecified: Secondary | ICD-10-CM

## 2017-06-07 DIAGNOSIS — E785 Hyperlipidemia, unspecified: Secondary | ICD-10-CM | POA: Diagnosis not present

## 2017-06-07 DIAGNOSIS — R51 Headache: Secondary | ICD-10-CM | POA: Diagnosis not present

## 2017-06-07 LAB — COMPREHENSIVE METABOLIC PANEL
ALBUMIN: 4.5 g/dL (ref 3.5–5.2)
ALT: 9 U/L (ref 0–35)
AST: 13 U/L (ref 0–37)
Alkaline Phosphatase: 83 U/L (ref 39–117)
BUN: 18 mg/dL (ref 6–23)
CALCIUM: 9.1 mg/dL (ref 8.4–10.5)
CHLORIDE: 104 meq/L (ref 96–112)
CO2: 26 mEq/L (ref 19–32)
Creatinine, Ser: 0.9 mg/dL (ref 0.40–1.20)
GFR: 87 mL/min (ref >60.00)
Glucose, Bld: 82 mg/dL (ref 70–99)
POTASSIUM: 3.9 meq/L (ref 3.5–5.1)
Sodium: 136 mEq/L (ref 135–145)
Total Bilirubin: 0.4 mg/dL (ref 0.2–1.2)
Total Protein: 6.9 g/dL (ref 6.0–8.3)

## 2017-06-07 LAB — LIPID PANEL
CHOLESTEROL: 219 mg/dL — AB (ref 0–200)
HDL: 46.6 mg/dL (ref >39.00)
LDL CALC: 153 mg/dL — AB (ref 0–99)
NonHDL: 172.57
TRIGLYCERIDES: 96 mg/dL (ref 0.0–149.0)
Total CHOL/HDL Ratio: 5
VLDL: 19.2 mg/dL (ref 0.0–40.0)

## 2017-06-07 MED ORDER — SAXENDA 18 MG/3ML ~~LOC~~ SOPN: 3.0000 mg | PEN_INJECTOR | Freq: Every day | SUBCUTANEOUS | 2 refills | Status: DC

## 2017-06-07 MED ORDER — SUMATRIPTAN SUCCINATE 50 MG PO TABS: ORAL_TABLET | ORAL | 1 refills | Status: DC

## 2017-06-07 NOTE — Patient Instructions (Signed)
For history of migraine ha refilled your imitrex and filled out fmla form.  For htn continue current bp med regimen.  For obesity continue saxenda. Refilled med today. Diet and exercise as well.  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

## 2017-06-07 NOTE — Progress Notes (Signed)
Subjective:    Patient ID: Katie Merritt, female    DOB: [birth year], 46 y.o.   MRN: [id number]  HPI  Pt her for fmla form to be filled out. She get fmla. She get occasional migraine ha. May get up to 3-4 ha a months. Can miss up to 2 consecutive.  Pt has used imitrex in past successful stopping ha early on. No headache today she just in today to review prior history and to get new fmla paperwork signed.   Pt states insurance will only give her 4-5 tablets of Imitrex per month.  BP is controlled today. Borderline. No cardiac or neurologic signs or symptoms.   Patient also brings up large left lump in her left flank. She has had for many years. She now wants to have evaluation of this area. She states previously told probable lipoma.  Review of Systems  Constitutional: Negative for chills, fatigue and fever.  HENT: Negative for congestion, ear discharge, facial swelling and nosebleeds.   Respiratory: Positive for cough. Negative for chest tightness, shortness of breath and wheezing.   Cardiovascular: Negative for chest pain and palpitations.  Gastrointestinal: Negative for abdominal pain, diarrhea, nausea and vomiting.  Genitourinary: Negative for dysuria and flank pain.  Musculoskeletal: Negative for back pain, gait problem and myalgias.  Skin: Negative for rash.  Neurological: Positive for headaches. Negative for dizziness, syncope, speech difficulty, weakness and numbness.       Pt last ha past week past Friday with cycle.  Pt gets light senstive with ha and occasional nausea not always.  Hematological: Negative for adenopathy. Does not bruise/bleed easily.  Psychiatric/Behavioral: Negative for behavioral problems, confusion and hallucinations. The patient is not nervous/anxious.     Past Medical History:  Diagnosis Date  . Anxiety   . COPD (chronic obstructive pulmonary disease) (HCC)   . Hyperlipidemia   . Hypertension      Social History   Social History  . Marital  status: Single    Spouse name: N/A  . Number of children: N/A  . Years of education: N/A   Occupational History  . Not on file.   Social History Main Topics  . Smoking status: Former Smoker    Quit date: 11/25/2013  . Smokeless tobacco: Never Used  . Alcohol use 0.0 oz/week     Comment: Light Smoker Only  . Drug use: Unknown  . Sexual activity: Not on file   Other Topics Concern  . Not on file   Social History Narrative  . No narrative on file    Past Surgical History:  Procedure Laterality Date  . Leap    . TUBAL LIGATION      Family History  Problem Relation Age of Onset  . Hyperlipidemia Mother   . Hypertension Mother     No Known Allergies  Current Outpatient Prescriptions on File Prior to Visit  Medication Sig Dispense Refill  . amLODipine-olmesartan (AZOR) 5-40 MG tablet Take 1 tablet by mouth daily. 90 tablet 1  . atorvastatin (LIPITOR) 80 MG tablet Take 80 mg by mouth at bedtime.    . ciprofloxacin (CIPRO) 250 MG tablet Take 1 tablet (250 mg total) by mouth 2 (two) times daily. 6 tablet 0  . fluconazole (DIFLUCAN) 150 MG tablet 1 tab po for one day. Repeat in 5-7 days if necessar 2 tablet 0  . metroNIDAZOLE (FLAGYL) 500 MG tablet Take 1 tablet (500 mg total) by mouth 3 (three) times daily. 21 tablet 0  . Multiple  Vitamins-Minerals (WOMENS MULTIVITAMIN PO) Take by mouth daily.    Marland Kitchen SAXENDA 18 MG/3ML SOPN Inject 3 mg as directed daily. 5 pen 2  . SUMAtriptan (IMITREX) 50 MG tablet TAKE 1 TABLET BY MOUTH AT ONSET OF HEADACHE. REPEAT IN 2 HOURS IF NEEDED. MAX 2 TABLETS IN 24 HOURS 27 tablet 1  . ULTICARE SHORT PEN NEEDLES 31G X 8 MM MISC USE AS DIRECTED TO INJECT SAXENDA 100 each 0   No current facility-administered medications on file prior to visit.     BP (!) 138/96   Pulse 90   Temp 98.2 F (36.8 C) (Oral)   Resp 16   Ht 5\' 5"  (1.651 m)   Wt 183 lb (83 kg)   SpO2 100%   BMI 30.45 kg/m       Objective:   Physical Exam  General Mental Status-  Alert. General Appearance- Not in acute distress.   Skin General: Color- Normal Color. Moisture- Normal Moisture.  Neck Carotid Arteries- Normal color. Moisture- Normal Moisture. No carotid bruits. No JVD.  Chest and Lung Exam Auscultation: Breath Sounds:-Normal.  Cardiovascular Auscultation:Rythm- Regular. Murmurs & Other Heart Sounds:Auscultation of the heart reveals- No Murmurs.  Abdomen Inspection:-Inspeection Normal. Palpation/Percussion:Note:No mass. Palpation and Percussion of the abdomen reveal- Non Tender, Non Distended + BS, no rebound or guarding.  Neurologic Cranial Nerve exam:- CN III-XII intact(No nystagmus), symmetric smile. .Finger to Nose:- Normal/Intact Strength:- 5/5 equal and symmetric strength both upper and lower extremities.  Left flank- large 9 cm x 4 cm lipoma.       Assessment & Plan:  For history of migraine ha refilled your imitrex and filled out fmla form.  For htn continue current bp med regimen.  For obesity continue saxenda. Refilled med today. Diet and exercise as well.  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

## 2017-06-09 ENCOUNTER — Other Ambulatory Visit: Payer: Self-pay

## 2017-06-09 MED ORDER — ATORVASTATIN CALCIUM 40 MG PO TABS: 40.0000 mg | ORAL_TABLET | Freq: Every day | ORAL | 2 refills | Status: DC

## 2017-07-11 ENCOUNTER — Ambulatory Visit: Payer: Self-pay | Admitting: Surgery

## 2017-07-11 NOTE — H&P (Signed)
History of Present Illness Katie Merritt. Katie Asher MD; 07/11/2017 11:24 AM) The patient is a 46 year old female who presents with a complaint of Mass. PCP Katie Pai, PA-C  Close for consultation his left flank lipoma This is a healthy 46 year old female who presents with at least 10 years of a slowly enlarging mass on her left flank. This is associated with some mild discomfort. It has never become infected. There is not been any imaging of this area. She presents now to discuss excision.   Past Surgical History Katie Merritt, [place]; 07/11/2017 10:54 AM) Oral Surgery  Diagnostic Studies History Katie Merritt, [place]; 07/11/2017 10:54 AM) Colonoscopy never Mammogram 1-3 years ago Pap Smear 1-5 years ago  Allergies Katie Merritt, RMA; 07/11/2017 10:54 AM) No Known Allergies 07/11/2017  Medication History Katie Merritt, RMA; 07/11/2017 10:58 AM) Atorvastatin Calcium (40MG  Tablet, Oral) Active. Saxenda (18MG /3ML Soln Pen-inj, Subcutaneous) Active. Imitrex (Oral) Specific strength unknown - Active. Amlodipine-Atorvastatin (5-40MG  Tablet, Oral) Active. Medications Reconciled  Social History Katie Merritt, [place]; 07/11/2017 10:54 AM) Alcohol use Occasional alcohol use. Caffeine use Carbonated beverages, Coffee, Tea. No drug use Tobacco use Former smoker.  Family History Katie Merritt, [place]; 07/11/2017 10:54 AM) Hypertension Mother.  Pregnancy / Birth History Katie Merritt, [place]; 07/11/2017 10:54 AM) Age at menarche 72 years. Contraceptive History Contraceptive implant, Depo-provera. Gravida 4 Maternal age 64-20 Para 4 Regular periods  Other Problems Katie Merritt, [place]; 07/11/2017 10:54 AM) High blood pressure Hypercholesterolemia Migraine Headache     Review of Systems Katie Merritt RMA; 07/11/2017 10:54 AM) General Not Present- Appetite Loss, Chills, Fatigue, Fever, Night Sweats, Weight Gain and Weight Loss. Skin Present-  Ulcer. Not Present- Change in Wart/Mole, Dryness, Hives, Jaundice, New Lesions, Non-Healing Wounds and Rash. HEENT Not Present- Earache, Hearing Loss, Hoarseness, Nose Bleed, Oral Ulcers, Ringing in the Ears, Seasonal Allergies, Sinus Pain, Sore Throat, Visual Disturbances, Wears glasses/contact lenses and Yellow Eyes. Respiratory Not Present- Bloody sputum, Chronic Cough, Difficulty Breathing, Snoring and Wheezing. Breast Not Present- Breast Mass, Breast Pain, Nipple Discharge and Skin Changes. Cardiovascular Not Present- Chest Pain, Difficulty Breathing Lying Down, Leg Cramps, Palpitations, Rapid Heart Rate, Shortness of Breath and Swelling of Extremities. Gastrointestinal Not Present- Abdominal Pain, Bloating, Bloody Stool, Change in Bowel Habits, Chronic diarrhea, Constipation, Difficulty Swallowing, Excessive gas, Gets full quickly at meals, Hemorrhoids, Indigestion, Nausea, Rectal Pain and Vomiting. Female Genitourinary Not Present- Frequency, Nocturia, Painful Urination, Pelvic Pain and Urgency. Musculoskeletal Not Present- Back Pain, Joint Pain, Joint Stiffness, Muscle Pain, Muscle Weakness and Swelling of Extremities. Neurological Not Present- Decreased Memory, Fainting, Headaches, Numbness, Seizures, Tingling, Tremor, Trouble walking and Weakness. Psychiatric Not Present- Anxiety, Bipolar, Change in Sleep Pattern, Depression, Fearful and Frequent crying. Endocrine Not Present- Cold Intolerance, Excessive Hunger, Hair Changes, Heat Intolerance, Hot flashes and New Diabetes. Hematology Not Present- Blood Thinners, Easy Bruising, Excessive bleeding, Gland problems, HIV and Persistent Infections.  Vitals Katie Merritt RMA; 07/11/2017 10:58 AM) 07/11/2017 10:58 AM Weight: 184.8 lb Height: 65in Body Surface Area: 1.91 m Body Mass Index: 30.75 kg/m  Temp.: 97.16F  Pulse: 96 (Regular)  BP: 118/90 (Sitting, Left Arm, Standard)      Physical Exam Katie Key K. Nicollette Wilhelmi MD;  07/11/2017 11:25 AM)  The physical exam findings are as follows: Note:WDWN in NAD Eyes: Pupils equal, round; sclera anicteric HENT: Oral mucosa moist; good dentition Neck: No masses palpated, no thyromegaly Lungs: CTA bilaterally; normal respiratory effort CV: Regular rate and rhythm; no murmurs; extremities well-perfused with no edema Left flank at edge of costal  margin there is a oval 5 x 8 cm subcutaneous mass that is protruding. This is fairly soft and well demarcated. No sign of infection or overlying skin changes. Mildly tender to palpation Abd: +bowel sounds, soft, non-tender, no palpable organomegaly; no palpable hernias Skin: Warm, dry; no sign of jaundice Psychiatric - alert and oriented x 4; calm mood and affect    Assessment & Plan Katie Key K. 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 The surgical procedure has been discussed with the patient. Potential risks, benefits, alternative treatments, and expected outcomes have been explained. All of the patient's questions at this time have been answered. The likelihood of reaching the patient's treatment goal is good. The patient understand the proposed surgical procedure and wishes to proceed.  Katie Merritt. Katie Dover, MD, [facility] Surgery  General/ Trauma Surgery  07/11/2017 11:25 AM

## 2017-08-24 ENCOUNTER — Ambulatory Visit (HOSPITAL_COMMUNITY): Admission: RE | Admit: 2017-08-24 | Payer: BLUE CROSS/BLUE SHIELD | Source: Ambulatory Visit | Admitting: Surgery

## 2017-08-24 ENCOUNTER — Encounter (HOSPITAL_COMMUNITY): Admission: RE | Payer: Self-pay | Source: Ambulatory Visit

## 2017-08-24 SURGERY — EXCISION LIPOMA
Anesthesia: General | Laterality: Left

## 2017-09-23 ENCOUNTER — Ambulatory Visit (INDEPENDENT_AMBULATORY_CARE_PROVIDER_SITE_OTHER): Payer: BLUE CROSS/BLUE SHIELD | Admitting: Medical

## 2017-09-23 ENCOUNTER — Encounter: Payer: Self-pay | Admitting: Medical

## 2017-09-23 ENCOUNTER — Other Ambulatory Visit (HOSPITAL_COMMUNITY)
Admission: RE | Admit: 2017-09-23 | Discharge: 2017-09-23 | Disposition: A | Discharge status: Home / Self Care | Payer: BLUE CROSS/BLUE SHIELD | Source: Ambulatory Visit | Attending: Medical | Admitting: Medical

## 2017-09-23 VITALS — BP 145/90 | HR 86 | Temp 98.0°F | Resp 16 | Wt 182.6 lb

## 2017-09-23 DIAGNOSIS — J449 Chronic obstructive pulmonary disease, unspecified: Secondary | ICD-10-CM | POA: Insufficient documentation

## 2017-09-23 DIAGNOSIS — R300 Dysuria: Secondary | ICD-10-CM

## 2017-09-23 DIAGNOSIS — N898 Other specified noninflammatory disorders of vagina: Secondary | ICD-10-CM | POA: Diagnosis not present

## 2017-09-23 DIAGNOSIS — E785 Hyperlipidemia, unspecified: Secondary | ICD-10-CM | POA: Insufficient documentation

## 2017-09-23 DIAGNOSIS — Z79899 Other long term (current) drug therapy: Secondary | ICD-10-CM | POA: Insufficient documentation

## 2017-09-23 DIAGNOSIS — Z87891 Personal history of nicotine dependence: Secondary | ICD-10-CM | POA: Insufficient documentation

## 2017-09-23 DIAGNOSIS — Z8249 Family history of ischemic heart disease and other diseases of the circulatory system: Secondary | ICD-10-CM | POA: Insufficient documentation

## 2017-09-23 DIAGNOSIS — I10 Essential (primary) hypertension: Secondary | ICD-10-CM | POA: Insufficient documentation

## 2017-09-23 LAB — POC URINALSYSI DIPSTICK (AUTOMATED)
Bilirubin, UA: NEGATIVE
GLUCOSE UA: NEGATIVE
Ketones, UA: NEGATIVE
LEUKOCYTES UA: NEGATIVE
NITRITE UA: NEGATIVE
Protein, UA: NEGATIVE
Spec Grav, UA: >=1.03 — AB (ref 1.010–1.025)
UROBILINOGEN UA: NEGATIVE U/dL — AB
pH, UA: 6 (ref 5.0–8.0)

## 2017-09-23 MED ORDER — FLUCONAZOLE 150 MG PO TABS: ORAL_TABLET | ORAL | 0 refills | Status: DC

## 2017-09-23 MED FILL — FLUCONAZOLE 150 MG TABLET: 150 | 7 days supply | Qty: 2 | Fill #0

## 2017-09-23 NOTE — Progress Notes (Signed)
Subjective:    Patient ID: Katie Merritt, female    DOB: 03/04/1971, 46 y.o.   MRN: [id number]  HPI  Pt in for possible uti.  All last week had urge to urinate with some frequency.(some discomfort on urination) Some faint low back pain last week. Some vaginal itch with white dc. No recent antibiotics.No fever or chills.  No history of chronic uti.   She has not taken BP medication today. No cardiac or neurologic signs or symptoms.    Review of Systems  Constitutional: Negative for chills, fatigue and fever.  HENT: Negative for congestion, dental problem, postnasal drip and sinus pain.   Respiratory: Negative for cough, chest tightness, shortness of breath and wheezing.   Cardiovascular: Negative for chest pain and palpitations.  Gastrointestinal: Negative for abdominal distention, abdominal pain, blood in stool, diarrhea, nausea and vomiting.  Genitourinary: Positive for dysuria and frequency. Negative for decreased urine volume, menstrual problem, urgency and vaginal pain.       Some white dc.  Musculoskeletal: Negative for back pain.       Some back pain last week. But not now.  Skin: Negative for rash.  Neurological: Negative for dizziness, seizures, weakness and headaches.  Hematological: Negative for adenopathy. Does not bruise/bleed easily.  Psychiatric/Behavioral: Negative for behavioral problems, confusion and suicidal ideas. The patient is not nervous/anxious.      Past Medical History:  Diagnosis Date  . Anxiety   . COPD (chronic obstructive pulmonary disease) (HCC)   . Hyperlipidemia   . Hypertension      Social History   Socioeconomic History  . Marital status: Single    Spouse name: Not on file  . Number of children: Not on file  . Years of education: Not on file  . Highest education level: Not on file  Social Needs  . Financial resource strain: Not on file  . Food insecurity - worry: Not on file  . Food insecurity - inability: Not on file  .  Transportation needs - medical: Not on file  . Transportation needs - non-medical: Not on file  Occupational History  . Not on file  Tobacco Use  . Smoking status: Former Smoker    Last attempt to quit: 11/25/2013    Years since quitting: 3.8  . Smokeless tobacco: Never Used  Substance and Sexual Activity  . Alcohol use: Yes    Alcohol/week: 0.0 oz    Comment: Light Smoker Only  . Drug use: Not on file  . Sexual activity: Not on file  Other Topics Concern  . Not on file  Social History Narrative  . Not on file    Past Surgical History:  Procedure Laterality Date  . Leap    . TUBAL LIGATION      Family History  Problem Relation Age of Onset  . Hyperlipidemia Mother   . Hypertension Mother     No Known Allergies  Current Outpatient Medications on File Prior to Visit  Medication Sig Dispense Refill  . amLODipine-olmesartan (AZOR) 5-40 MG tablet Take 1 tablet by mouth daily. 90 tablet 1  . atorvastatin (LIPITOR) 40 MG tablet Take 1 tablet (40 mg total) by mouth daily. 30 tablet 2  . atorvastatin (LIPITOR) 80 MG tablet Take 80 mg by mouth at bedtime.    . ciprofloxacin (CIPRO) 250 MG tablet Take 1 tablet (250 mg total) by mouth 2 (two) times daily. 6 tablet 0  . fluconazole (DIFLUCAN) 150 MG tablet 1 tab po for one  day. Repeat in 5-7 days if necessar 2 tablet 0  . metroNIDAZOLE (FLAGYL) 500 MG tablet Take 1 tablet (500 mg total) by mouth 3 (three) times daily. 21 tablet 0  . Multiple Vitamins-Minerals (WOMENS MULTIVITAMIN PO) Take by mouth daily.    Marland Kitchen SAXENDA 18 MG/3ML SOPN Inject 3 mg as directed daily. 5 pen 2  . SUMAtriptan (IMITREX) 50 MG tablet TAKE 1 TABLET BY MOUTH AT ONSET OF HEADACHE. REPEAT IN 2 HOURS IF NEEDED. MAX 2 TABLETS IN 24 HOURS 27 tablet 1  . ULTICARE SHORT PEN NEEDLES 31G X 8 MM MISC USE AS DIRECTED TO INJECT SAXENDA 100 each 0   No current facility-administered medications on file prior to visit.     BP (!) 153/104   Pulse 86   Temp 98 F (36.7 C)  (Oral)   Resp 16   Wt 182 lb 9.6 oz (82.8 kg)   SpO2 100%   BMI 30.39 kg/m       Objective:   Physical Exam  General- No acute distress. Pleasant patient. Neck- Full range of motion, no jvd Lungs- Clear, even and unlabored. Heart- regular rate and rhythm. Neurologic- CNII- XII grossly intact.  Abdomen- soft, non-distended, +bs. No rebound or guarding.no suprapubic tenderness.  Back- no cva tenderness.     Assessment & Plan:  You had some mixed symptoms recently of both UTI and possible yeast infection.  Predominant symptom most recently appears to be yeast infection.  Will prescribe Diflucan today and send out urine culture as well with ancillary study to test for bacterial vaginosis.  I expect to get urine culture results by Monday or Tuesday.  If bacteria found that we will prescribe antibiotic but currently antibiotic could potentially worsen possible yeast infection.  If all studies are negative and symptoms persist then would need to repeat urine ancillary studies and had additional test.  Your blood pressure is moderately high today.  Please get back on medication.  Follow-up in 7 days or as needed.

## 2017-09-23 NOTE — Patient Instructions (Addendum)
You  had some mixed symptoms recently of both UTI and possible yeast infection.  Predominant symptom most recently appears to be yeast infection.  Will prescribe Diflucan today and send out urine culture as well with ancillary study to test for bacterial vaginosis.  I expect to get urine culture results by Monday or Tuesday.  If bacteria found that we will prescribe antibiotic but currently antibiotic could potentially worsen possible yeast infection.  If all studies are negative and symptoms persist then would need to repeat urine ancillary studies and had additional test.  Your blood pressure is moderately high today.  Please get back on medication.  Follow-up in 7 days or as needed.

## 2017-09-26 ENCOUNTER — Telehealth: Payer: Self-pay | Admitting: Medical

## 2017-09-26 LAB — URINE CULTURE
MICRO NUMBER: 81348213
SPECIMEN QUALITY: ADEQUATE

## 2017-09-26 MED ORDER — AMOXICILLIN-POT CLAVULANATE 875-125 MG PO TABS: 1.0000 | ORAL_TABLET | Freq: Two times a day (BID) | ORAL | 0 refills | Status: DC

## 2017-09-26 NOTE — Telephone Encounter (Signed)
Prescription of Augmentin sent to patient's pharmacy. 

## 2017-09-28 ENCOUNTER — Telehealth: Payer: Self-pay | Admitting: Medical

## 2017-09-28 LAB — URINE CYTOLOGY ANCILLARY ONLY: Bacterial vaginitis: POSITIVE — AB

## 2017-09-28 MED ORDER — METRONIDAZOLE 500 MG PO TABS: 500.0000 mg | ORAL_TABLET | Freq: Three times a day (TID) | ORAL | 0 refills | Status: DC

## 2017-09-28 NOTE — Telephone Encounter (Signed)
Sent in prescription of Flagyl to patient's pharmacy.

## 2017-10-17 ENCOUNTER — Encounter: Payer: Self-pay | Admitting: Medical

## 2017-10-19 ENCOUNTER — Encounter: Payer: Self-pay | Admitting: Medical

## 2017-10-27 ENCOUNTER — Encounter: Payer: Self-pay | Admitting: Medical

## 2017-10-27 ENCOUNTER — Ambulatory Visit: Payer: BLUE CROSS/BLUE SHIELD | Admitting: Medical

## 2017-10-27 VITALS — BP 140/90 | HR 82 | Temp 98.1°F | Resp 16 | Ht 65.0 in | Wt 185.8 lb

## 2017-10-27 DIAGNOSIS — F329 Major depressive disorder, single episode, unspecified: Secondary | ICD-10-CM

## 2017-10-27 DIAGNOSIS — F419 Anxiety disorder, unspecified: Secondary | ICD-10-CM

## 2017-10-27 DIAGNOSIS — F32A Depression, unspecified: Secondary | ICD-10-CM

## 2017-10-27 MED ORDER — VENLAFAXINE HCL ER 75 MG PO CP24
75.0000 mg | ORAL_CAPSULE | Freq: Every day | ORAL | 1 refills | Status: DC
Start: 2017-10-27 — End: 2017-12-27

## 2017-10-27 MED ORDER — ALPRAZOLAM 0.5 MG PO TABS: 0.5000 mg | ORAL_TABLET | Freq: Every evening | ORAL | 0 refills | Status: DC | PRN

## 2017-10-27 NOTE — Progress Notes (Signed)
Subjective:    Patient ID: Katie Merritt, female    DOB: 1971-04-25, 47 y.o.   MRN: [id number]  HPI    Pt states in past she seems to have some depression in winter. States for about 3 months each year during the winter and often when life stressors increase.  Pt states some depression and anxiety.  Recently at work some severe anxiety/almost panic attack. Feels like need to scream at work.   Pt not had much time off. 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  Pt has not been exercising. Just started/joined [employer].   Review of Systems  Constitutional: Negative for chills, fatigue and fever.  Respiratory: Negative for cough, chest tightness, shortness of breath and wheezing.   Cardiovascular: Negative for chest pain and palpitations.  Gastrointestinal: Negative for abdominal distention, abdominal pain, constipation and vomiting.  Musculoskeletal: Negative for back pain and gait problem.  Skin: Negative for rash.  Neurological: Negative for dizziness, weakness, light-headedness and headaches.  Hematological: Negative for adenopathy. Does not bruise/bleed easily.  Psychiatric/Behavioral: Positive for dysphoric mood. Negative for agitation, decreased concentration, hallucinations, self-injury and suicidal ideas. The patient is nervous/anxious.     Past Medical History:  Diagnosis Date  . Anxiety   . COPD (chronic obstructive pulmonary disease) (HCC)   . Hyperlipidemia   . Hypertension      Social History   Socioeconomic History  . Marital status: Single    Spouse name: Not on file  . Number of children: Not on file  . Years of education: Not on file  . Highest education level: Not on file  Social Needs  . Financial resource strain: Not on file  . Food insecurity - worry: Not on file  . Food insecurity - inability: Not on file  . Transportation needs - medical: Not on file  . Transportation needs - non-medical: Not on file  Occupational History  . Not on file    Tobacco Use  . Smoking status: Former Smoker    Last attempt to quit: 11/25/2013    Years since quitting: 3.9  . Smokeless tobacco: Never Used  Substance and Sexual Activity  . Alcohol use: Yes    Alcohol/week: 0.0 oz    Comment: Light Smoker Only  . Drug use: Not on file  . Sexual activity: Not on file  Other Topics Concern  . Not on file  Social History Narrative  . Not on file    Past Surgical History:  Procedure Laterality Date  . Leap    . TUBAL LIGATION      Family History  Problem Relation Age of Onset  . Hyperlipidemia Mother   . Hypertension Mother     No Known Allergies  Current Outpatient Medications on File Prior to Visit  Medication Sig Dispense Refill  . amLODipine-olmesartan (AZOR) 5-40 MG tablet Take 1 tablet by mouth daily. 90 tablet 1  . atorvastatin (LIPITOR) 40 MG tablet Take 1 tablet (40 mg total) by mouth daily. 30 tablet 2  . atorvastatin (LIPITOR) 80 MG tablet Take 80 mg by mouth at bedtime.    . fluconazole (DIFLUCAN) 150 MG tablet 1 tab po for one day. Repeat in 5-7 days if necessar 2 tablet 0  . metroNIDAZOLE (FLAGYL) 500 MG tablet Take 1 tablet (500 mg total) by mouth 3 (three) times daily. 21 tablet 0  . Multiple Vitamins-Minerals (WOMENS MULTIVITAMIN PO) Take by mouth daily.    Marland Kitchen SAXENDA 18 MG/3ML SOPN Inject 3 mg as directed daily. 5  pen 2  . SUMAtriptan (IMITREX) 50 MG tablet TAKE 1 TABLET BY MOUTH AT ONSET OF HEADACHE. REPEAT IN 2 HOURS IF NEEDED. MAX 2 TABLETS IN 24 HOURS 27 tablet 1  . ULTICARE SHORT PEN NEEDLES 31G X 8 MM MISC USE AS DIRECTED TO INJECT SAXENDA 100 each 0   No current facility-administered medications on file prior to visit.     BP (!) 144/96   Pulse 82   Temp 98.1 F (36.7 C) (Oral)   Resp 16   Ht 5\' 5"  (1.651 m)   Wt 185 lb 12.8 oz (84.3 kg)   SpO2 100%   BMI 30.92 kg/m       Objective:   Physical Exam  General Mental Status- Alert. General Appearance- Not in acute distress.   Skin General: Color-  Normal Color. Moisture- Normal Moisture.  Neck Carotid Arteries- Normal color. Moisture- Normal Moisture. No carotid bruits. No JVD.  Chest and Lung Exam Auscultation: Breath Sounds:-Normal.  Cardiovascular Auscultation:Rythm- Regular. Murmurs & Other Heart Sounds:Auscultation of the heart reveals- No Murmurs.  Abdomen Inspection:-Inspeection Normal. Palpation/Percussion:Note:No mass. Palpation and Percussion of the abdomen reveal- Non Tender, Non Distended + BS, no rebound or guarding.    Neurologic Cranial Nerve exam:- CN III-XII intact(No nystagmus), symmetric smile. Strength:- 5/5 equal and symmetric strength both upper and lower extremities.      Assessment & Plan:  For anxiety and depression, I am prescribing effexor and xanax. Can use xanax for short term but my hope is that you will do well with effexor alone after about 2 weeks of med.  If mood worsens or changes let us know.  Follow up in one month or as needed

## 2017-10-27 NOTE — Patient Instructions (Signed)
For anxiety and depression, I am prescribing effexor and xanax. Can use xanax for short term but my hope is that you will do well with effexor alone after about 2 weeks of med.  If mood worsens or changes let us know.  Follow up in one month or as needed

## 2017-11-02 DIAGNOSIS — N760 Acute vaginitis: Secondary | ICD-10-CM | POA: Diagnosis not present

## 2017-11-03 ENCOUNTER — Other Ambulatory Visit: Payer: Self-pay | Admitting: Medical

## 2017-11-04 MED FILL — ULTICARE PEN NDL 8MM 31G: 31G X 8 MM | 30 days supply | Qty: 100 | Fill #0

## 2017-11-21 DIAGNOSIS — N760 Acute vaginitis: Secondary | ICD-10-CM | POA: Diagnosis not present

## 2017-12-08 DIAGNOSIS — N920 Excessive and frequent menstruation with regular cycle: Secondary | ICD-10-CM | POA: Diagnosis not present

## 2017-12-08 DIAGNOSIS — Z01419 Encounter for gynecological examination (general) (routine) without abnormal findings: Secondary | ICD-10-CM | POA: Diagnosis not present

## 2017-12-08 DIAGNOSIS — Z113 Encounter for screening for infections with a predominantly sexual mode of transmission: Secondary | ICD-10-CM | POA: Diagnosis not present

## 2017-12-08 DIAGNOSIS — Z124 Encounter for screening for malignant neoplasm of cervix: Secondary | ICD-10-CM | POA: Diagnosis not present

## 2017-12-08 DIAGNOSIS — E559 Vitamin D deficiency, unspecified: Secondary | ICD-10-CM | POA: Diagnosis not present

## 2017-12-08 DIAGNOSIS — Z6829 Body mass index (BMI) 29.0-29.9, adult: Secondary | ICD-10-CM | POA: Diagnosis not present

## 2017-12-08 DIAGNOSIS — Z1231 Encounter for screening mammogram for malignant neoplasm of breast: Secondary | ICD-10-CM | POA: Diagnosis not present

## 2017-12-14 ENCOUNTER — Other Ambulatory Visit: Payer: Self-pay | Admitting: Obstetrics and Gynecology

## 2017-12-14 DIAGNOSIS — N898 Other specified noninflammatory disorders of vagina: Secondary | ICD-10-CM | POA: Diagnosis not present

## 2017-12-14 DIAGNOSIS — D259 Leiomyoma of uterus, unspecified: Secondary | ICD-10-CM | POA: Diagnosis not present

## 2017-12-14 DIAGNOSIS — N760 Acute vaginitis: Secondary | ICD-10-CM | POA: Diagnosis not present

## 2017-12-14 DIAGNOSIS — N920 Excessive and frequent menstruation with regular cycle: Secondary | ICD-10-CM | POA: Diagnosis not present

## 2017-12-26 DIAGNOSIS — Z30430 Encounter for insertion of intrauterine contraceptive device: Secondary | ICD-10-CM | POA: Diagnosis not present

## 2017-12-26 DIAGNOSIS — N920 Excessive and frequent menstruation with regular cycle: Secondary | ICD-10-CM | POA: Diagnosis not present

## 2017-12-27 ENCOUNTER — Telehealth: Payer: Self-pay | Admitting: Medical

## 2017-12-27 NOTE — Telephone Encounter (Signed)
Pt due for follow up please call and schedule appointment.  

## 2017-12-27 NOTE — Telephone Encounter (Signed)
Called pt and left message to call us back to set up follow appt.

## 2018-01-06 ENCOUNTER — Encounter: Payer: Self-pay | Admitting: Medical

## 2018-01-26 ENCOUNTER — Other Ambulatory Visit: Payer: Self-pay | Admitting: Medical

## 2018-01-31 DIAGNOSIS — N760 Acute vaginitis: Secondary | ICD-10-CM | POA: Diagnosis not present

## 2018-01-31 DIAGNOSIS — N898 Other specified noninflammatory disorders of vagina: Secondary | ICD-10-CM | POA: Diagnosis not present

## 2018-01-31 DIAGNOSIS — Z09 Encounter for follow-up examination after completed treatment for conditions other than malignant neoplasm: Secondary | ICD-10-CM | POA: Diagnosis not present

## 2018-02-23 DIAGNOSIS — N898 Other specified noninflammatory disorders of vagina: Secondary | ICD-10-CM | POA: Diagnosis not present

## 2018-02-23 DIAGNOSIS — N760 Acute vaginitis: Secondary | ICD-10-CM | POA: Diagnosis not present

## 2018-03-04 ENCOUNTER — Other Ambulatory Visit: Payer: Self-pay | Admitting: Medical

## 2018-03-08 ENCOUNTER — Other Ambulatory Visit: Payer: Self-pay | Admitting: Medical

## 2018-03-10 ENCOUNTER — Telehealth: Payer: Self-pay

## 2018-03-10 NOTE — Telephone Encounter (Signed)
PA initiated via Covermymeds; KEY: [id number]. Received real-time PA approval.   [id number];Review Type:Prior Auth;Coverage Start Date:02/08/2018;Coverage End Date:03/10/2019;

## 2018-03-13 DIAGNOSIS — E559 Vitamin D deficiency, unspecified: Secondary | ICD-10-CM | POA: Diagnosis not present

## 2018-04-28 ENCOUNTER — Encounter: Payer: Self-pay | Admitting: Medical

## 2018-04-28 ENCOUNTER — Ambulatory Visit: Payer: BLUE CROSS/BLUE SHIELD | Admitting: Medical

## 2018-04-28 VITALS — BP 135/85 | HR 68 | Temp 98.3°F | Resp 16 | Ht 65.0 in | Wt 186.6 lb

## 2018-04-28 DIAGNOSIS — R51 Headache: Secondary | ICD-10-CM

## 2018-04-28 DIAGNOSIS — R519 Headache, unspecified: Secondary | ICD-10-CM

## 2018-04-28 MED ORDER — ZOLMITRIPTAN 2.5 MG PO TBDP: 2.5000 mg | ORAL_TABLET | ORAL | 0 refills | Status: DC | PRN

## 2018-04-28 MED ORDER — TOPIRAMATE 25 MG PO TABS: ORAL_TABLET | ORAL | 0 refills | Status: DC

## 2018-04-28 NOTE — Patient Instructions (Addendum)
For your migraine headaches, I do think you might benefit from Topamax in attempt to prevent recurrence of headaches.  If you do get headache can try Zomig instead of Imitrex.  We will see if this works better.  If this combination does not decrease frequency of your headaches then will consider referral to neurologist.  Filled out your FMLA form.  Mackie Pai, PA-C  Follow-up in 1 month or as needed.

## 2018-04-28 NOTE — Progress Notes (Signed)
Subjective:    Patient ID: Katie Merritt, female    DOB: 05/25/1971, 47 y.o.   MRN: [id number]  HPI  Pt has history of HA.   In past I gave 4 days per month on FMLA form. She states she needs more days since sometimes will leave work early and counts as whole day. They are recommending 7-10 days.  Pt states she gets migraine ha. She tried imitrex but does not help. She state 2 alleve will work better if she can lay down, rest and get in dark room(ha will subside quickly). Her ha are light sensitive with mild nasu  She states ha may last 4-5 hours if she has work and can't rest.  Pt last ha was past Sunday.    Review of Systems  Constitutional: Negative for chills, fatigue and fever.  Eyes: Positive for photophobia.  Respiratory: Negative for cough, chest tightness, shortness of breath and wheezing.   Cardiovascular: Negative for chest pain and palpitations.  Genitourinary: Negative for difficulty urinating, dysuria, flank pain and frequency.  Musculoskeletal: Negative for back pain, myalgias, neck pain and neck stiffness.  Skin: Negative for rash.  Neurological: Positive for headaches. Negative for dizziness, speech difficulty, weakness and numbness.  Hematological: Negative for adenopathy. Does not bruise/bleed easily.  Psychiatric/Behavioral: Negative for behavioral problems, confusion, dysphoric mood, sleep disturbance and suicidal ideas. The patient is not nervous/anxious.    Past Medical History:  Diagnosis Date  . Anxiety   . COPD (chronic obstructive pulmonary disease) (HCC)   . Hyperlipidemia   . Hypertension      Social History   Socioeconomic History  . Marital status: Single    Spouse name: Not on file  . Number of children: Not on file  . Years of education: Not on file  . Highest education level: Not on file  Occupational History  . Not on file  Social Needs  . Financial resource strain: Not on file  . Food insecurity:    Worry: Not on file   Inability: Not on file  . Transportation needs:    Medical: Not on file    Non-medical: Not on file  Tobacco Use  . Smoking status: Former Smoker    Last attempt to quit: 11/25/2013    Years since quitting: 4.4  . Smokeless tobacco: Never Used  Substance and Sexual Activity  . Alcohol use: Yes    Alcohol/week: 0.0 oz    Comment: Light Smoker Only  . Drug use: Not on file  . Sexual activity: Not on file  Lifestyle  . Physical activity:    Days per week: Not on file    Minutes per session: Not on file  . Stress: Not on file  Relationships  . Social connections:    Talks on phone: Not on file    Gets together: Not on file    Attends religious service: Not on file    Active member of club or organization: Not on file    Attends meetings of clubs or organizations: Not on file    Relationship status: Not on file  . Intimate partner violence:    Fear of current or ex partner: Not on file    Emotionally abused: Not on file    Physically abused: Not on file    Forced sexual activity: Not on file  Other Topics Concern  . Not on file  Social History Narrative  . Not on file    Past Surgical History:  Procedure Laterality  Date  . Leap    . TUBAL LIGATION      Family History  Problem Relation Age of Onset  . Hyperlipidemia Mother   . Hypertension Mother     No Known Allergies  Current Outpatient Medications on File Prior to Visit  Medication Sig Dispense Refill  . ALPRAZolam (XANAX) 0.5 MG tablet Take 1 tablet (0.5 mg total) by mouth at bedtime as needed for anxiety. 14 tablet 0  . amLODipine-olmesartan (AZOR) 5-40 MG tablet TAKE 1 TABLET BY MOUTH EVERY DAY 90 tablet 1  . atorvastatin (LIPITOR) 40 MG tablet Take 1 tablet (40 mg total) by mouth daily. 30 tablet 2  . atorvastatin (LIPITOR) 80 MG tablet Take 80 mg by mouth at bedtime.    . fluconazole (DIFLUCAN) 150 MG tablet 1 tab po for one day. Repeat in 5-7 days if necessar 2 tablet 0  . levonorgestrel (MIRENA, 52 MG,)  20 MCG/24HR IUD Mirena 20 mcg/24 hours (5 yrs) 52 mg intrauterine device  Take 1 device by intrauterine route.    . metroNIDAZOLE (FLAGYL) 500 MG tablet Take 1 tablet (500 mg total) by mouth 3 (three) times daily. 21 tablet 0  . Multiple Vitamins-Minerals (WOMENS MULTIVITAMIN PO) Take by mouth daily.    Marland Kitchen SAXENDA 18 MG/3ML SOPN INJECT 3 MG AS DIRECTED DAILY. 15 mL 2  . SUMAtriptan (IMITREX) 50 MG tablet TAKE 1 TABLET BY MOUTH AT ONSET OF HEADACHE. REPEAT IN 2 HOURS IF NEEDED. MAX 2 TABLETS IN 24 HOURS 27 tablet 1  . ULTICARE SHORT PEN NEEDLES 31G X 8 MM MISC USE AS DIRECTED TO INJECT SAXENDA 100 each 0  . venlafaxine XR (EFFEXOR-XR) 75 MG 24 hr capsule TAKE 1 CAPSULE (75 MG TOTAL) BY MOUTH DAILY WITH BREAKFAST. 30 capsule 1   No current facility-administered medications on file prior to visit.     Pulse 68   Temp 98.3 F (36.8 C) (Oral)   Resp 16   Ht 5\' 5"  (1.651 m)   Wt 186 lb 9.6 oz (84.6 kg)   SpO2 100%   BMI 31.05 kg/m      Objective:   Physical Exam  General Mental Status- Alert. General Appearance- Not in acute distress.   Skin General: Color- Normal Color. Moisture- Normal Moisture.  Neck Carotid Arteries- Normal color. Moisture- Normal Moisture. No carotid bruits. No JVD.  Chest and Lung Exam Auscultation: Breath Sounds:-Normal.  Cardiovascular Auscultation:Rythm- Regular. Murmurs & Other Heart Sounds:Auscultation of the heart reveals- No Murmurs.  Abdomen Inspection:-Inspeection Normal. Palpation/Percussion:Note:No mass. Palpation and Percussion of the abdomen reveal- Non Tender, Non Distended + BS, no rebound or guarding.   Neurologic Cranial Nerve exam:- CN III-XII intact(No nystagmus), symmetric smile. Drift Test:- No drift. Finger to Nose:- Normal/Intact Strength:- 5/5 equal and symmetric strength both upper and lower extremities.      Assessment & Plan:  For your migraine headaches, I do think you might benefit from Topamax in attempt to prevent  recurrence of headaches.  If you do get headache can try Zomig instead of Imitrex.  We will see if this works better.  If this combination does not decrease frequency of your headaches then will consider referral to neurologist.  Filled out your FMLA form.  Follow-up in 1 month or as needed.

## 2018-05-23 ENCOUNTER — Other Ambulatory Visit: Payer: Self-pay | Admitting: Medical

## 2018-06-05 ENCOUNTER — Telehealth: Payer: Self-pay | Admitting: Medical

## 2018-06-05 NOTE — Telephone Encounter (Signed)
Patient dropping off another set of FMLA paperwork that is supposed to be for the year. Intermittent leave for migraines. Fax & patient will pick up original. Please call patient when completed. [email or web address] placed in [place] bin up front.

## 2018-06-06 DIAGNOSIS — N760 Acute vaginitis: Secondary | ICD-10-CM | POA: Diagnosis not present

## 2018-06-06 NOTE — Telephone Encounter (Signed)
Noted, FMLA is only for a 61-month period now, that is standard/SLS

## 2018-06-12 NOTE — Telephone Encounter (Signed)
Received FMLA/STD paperwork from [facility]; completed as much as possible; forwarded to provider/SLS 08/19

## 2018-06-14 ENCOUNTER — Encounter: Payer: Self-pay | Admitting: Medical

## 2018-06-15 ENCOUNTER — Telehealth: Payer: Self-pay | Admitting: Medical

## 2018-06-15 NOTE — Telephone Encounter (Signed)
I reviewed and signed pt fmla form. Will you go ahead and fax the form in. Thanks for help with form.  I placed it under your stapler.

## 2018-06-19 NOTE — Telephone Encounter (Signed)
Pt calling to f/u. She is requesting call back to notify if this was faxed to [facility] and if she can p/u copy.

## 2018-06-20 NOTE — Telephone Encounter (Signed)
Pt came in office wanting to take copies of her FMLA paperwork, pt is needing it to take also to work. (Looked for the paperwork in office and document not in front office bin, and document was not seen) Please call [phone] pt when have documents FMLA ASAP so pt can take work. Please advise ASAP.

## 2018-06-21 NOTE — Telephone Encounter (Signed)
Set pt a mychart message notifying her paperwork is ready to pick up.

## 2018-06-27 NOTE — Telephone Encounter (Signed)
Hinton Dyer, CMA 6 days ago      Set pt a mychart message notifying her paperwork is ready to pick up.

## 2018-07-14 ENCOUNTER — Encounter: Payer: Self-pay | Admitting: Medical

## 2018-07-18 ENCOUNTER — Telehealth: Payer: Self-pay | Admitting: Medical

## 2018-07-18 ENCOUNTER — Encounter: Payer: Self-pay | Admitting: Medical

## 2018-07-18 NOTE — Telephone Encounter (Signed)
I need to get copy of lmost recent last 2 fmla forms that I filled out. Pt company giving her problems and pt feels like she may be terminated. Need to refer to both of these forms to fill out another fmla form. Will you get pt scheduled for this Thursday or on Monday afternoon. She needs to another form filled out by Oct 2nd.

## 2018-07-18 NOTE — Telephone Encounter (Signed)
Do we have copy of the last fmla form that I filled out. Would like to review it.

## 2018-07-18 NOTE — Telephone Encounter (Signed)
Patient dropping off from- please fax & call patient when original is ready for pick up. Placed in provider tray up front.

## 2018-07-18 NOTE — Telephone Encounter (Signed)
Yes, it is under the Media tab dated 04/28/18.Marland KitchenSLS 09/24

## 2018-07-19 NOTE — Telephone Encounter (Addendum)
There is only one form for  FMLA in chart under media tab 04/28/18

## 2018-07-19 NOTE — Telephone Encounter (Signed)
Conversation  (Newest Message First)  Me    07/18/18 11:23 AM  Note    Yes, it is under the Media tab dated 04/28/18.Marland KitchenSLS 09/24       07/18/18 8:19 AM  Saguier, Percell Miller, PA-C routed this conversation to Me  Elise Benne    07/18/18 8:19 AM  Note    Do we have copy of the last fmla form that I filled out. Would like to review it.

## 2018-07-20 ENCOUNTER — Encounter: Payer: Self-pay | Admitting: Medical

## 2018-07-20 ENCOUNTER — Ambulatory Visit: Payer: BLUE CROSS/BLUE SHIELD | Admitting: Medical

## 2018-07-20 VITALS — BP 138/88 | HR 74 | Temp 98.0°F | Resp 16 | Ht 65.0 in | Wt 193.6 lb

## 2018-07-20 DIAGNOSIS — G43809 Other migraine, not intractable, without status migrainosus: Secondary | ICD-10-CM | POA: Diagnosis not present

## 2018-07-20 DIAGNOSIS — I10 Essential (primary) hypertension: Secondary | ICD-10-CM

## 2018-07-20 DIAGNOSIS — F17200 Nicotine dependence, unspecified, uncomplicated: Secondary | ICD-10-CM | POA: Diagnosis not present

## 2018-07-20 MED ORDER — BUPROPION HCL ER (SR) 150 MG PO TB12: 150.0000 mg | ORAL_TABLET | Freq: Every day | ORAL | 0 refills | Status: DC

## 2018-07-20 NOTE — Progress Notes (Signed)
Subjective:    Patient ID: Katie Merritt, female    DOB: 22-Feb-1971, 47 y.o.   MRN: [id number]  HPI   Pt in for migraine ha history. I did fill out form in august and our staff did not make copies of that form. Pt expresses that he work had issues with the way form was filled out. Need paperwork filled out again.  Pt ok with having 8 days available for her to miss work per month.  Duration of event would be one day per episode.  Pt is on topamax and zomig.Pt states med moderate controlled but if she does get HA and delay in taking zomig then med is not as effective. Also will bring ha level down but not level that would allow to work per pt. Also insurance only give 10 tablets a month. Describes when gets HA difficult to work with computer and describes high stress environment.  Pt last ha was past Thursday.   Pt states she recently started smoking again due to stress. She request wellbutrin again. Last time used helped her to stop smoking quickly.   Review of Systems  Constitutional: Negative for chills, fatigue and fever.  Respiratory: Negative for cough, chest tightness, shortness of breath and wheezing.   Cardiovascular: Negative for chest pain and palpitations.  Gastrointestinal: Negative for abdominal pain.  Musculoskeletal: Negative for back pain, neck pain and neck stiffness.  Skin: Negative for rash.  Neurological: Negative for dizziness, facial asymmetry and weakness.  Hematological: Negative for adenopathy. Does not bruise/bleed easily.  Psychiatric/Behavioral: Negative for behavioral problems, confusion and suicidal ideas. The patient is not nervous/anxious.     Past Medical History:  Diagnosis Date  . Anxiety   . COPD (chronic obstructive pulmonary disease) (HCC)   . Hyperlipidemia   . Hypertension      Social History   Socioeconomic History  . Marital status: Single    Spouse name: Not on file  . Number of children: Not on file  . Years of education: Not on  file  . Highest education level: Not on file  Occupational History  . Not on file  Social Needs  . Financial resource strain: Not on file  . Food insecurity:    Worry: Not on file    Inability: Not on file  . Transportation needs:    Medical: Not on file    Non-medical: Not on file  Tobacco Use  . Smoking status: Former Smoker    Last attempt to quit: 11/25/2013    Years since quitting: 4.6  . Smokeless tobacco: Never Used  Substance and Sexual Activity  . Alcohol use: Yes    Alcohol/week: 0.0 standard drinks    Comment: Light Smoker Only  . Drug use: Not on file  . Sexual activity: Not on file  Lifestyle  . Physical activity:    Days per week: Not on file    Minutes per session: Not on file  . Stress: Not on file  Relationships  . Social connections:    Talks on phone: Not on file    Gets together: Not on file    Attends religious service: Not on file    Active member of club or organization: Not on file    Attends meetings of clubs or organizations: Not on file    Relationship status: Not on file  . Intimate partner violence:    Fear of current or ex partner: Not on file    Emotionally abused: Not on  file    Physically abused: Not on file    Forced sexual activity: Not on file  Other Topics Concern  . Not on file  Social History Narrative  . Not on file    Past Surgical History:  Procedure Laterality Date  . Leap    . TUBAL LIGATION      Family History  Problem Relation Age of Onset  . Hyperlipidemia Mother   . Hypertension Mother     No Known Allergies  Current Outpatient Medications on File Prior to Visit  Medication Sig Dispense Refill  . ALPRAZolam (XANAX) 0.5 MG tablet Take 1 tablet (0.5 mg total) by mouth at bedtime as needed for anxiety. 14 tablet 0  . amLODipine-olmesartan (AZOR) 5-40 MG tablet TAKE 1 TABLET BY MOUTH EVERY DAY 90 tablet 1  . atorvastatin (LIPITOR) 40 MG tablet Take 1 tablet (40 mg total) by mouth daily. 30 tablet 2  .  atorvastatin (LIPITOR) 80 MG tablet Take 80 mg by mouth at bedtime.    . fluconazole (DIFLUCAN) 150 MG tablet 1 tab po for one day. Repeat in 5-7 days if necessar 2 tablet 0  . levonorgestrel (MIRENA, 52 MG,) 20 MCG/24HR IUD Mirena 20 mcg/24 hours (5 yrs) 52 mg intrauterine device  Take 1 device by intrauterine route.    . metroNIDAZOLE (FLAGYL) 500 MG tablet Take 1 tablet (500 mg total) by mouth 3 (three) times daily. 21 tablet 0  . Multiple Vitamins-Minerals (WOMENS MULTIVITAMIN PO) Take by mouth daily.    Marland Kitchen SAXENDA 18 MG/3ML SOPN INJECT 3 MG AS DIRECTED DAILY. 15 mL 2  . topiramate (TOPAMAX) 25 MG tablet 1 tab po q hs for one week then twice daily 50 tablet 0  . ULTICARE SHORT PEN NEEDLES 31G X 8 MM MISC USE AS DIRECTED TO INJECT SAXENDA 100 each 0  . venlafaxine XR (EFFEXOR-XR) 75 MG 24 hr capsule TAKE 1 CAPSULE (75 MG TOTAL) BY MOUTH DAILY WITH BREAKFAST. 90 capsule 1  . zolmitriptan (ZOMIG-ZMT) 2.5 MG disintegrating tablet Take 1 tablet (2.5 mg total) by mouth as needed for migraine. May repeat 2 hours if ha persist. 10 tablet 0   No current facility-administered medications on file prior to visit.     BP (!) 149/112   Pulse 74   Temp 98 F (36.7 C) (Oral)   Resp 16   Ht 5\' 5"  (1.651 m)   Wt 193 lb 9.6 oz (87.8 kg)   SpO2 100%   BMI 32.22 kg/m       Objective:   Physical Exam  General Mental Status- Alert. General Appearance- Not in acute distress.   Skin General: Color- Normal Color. Moisture- Normal Moisture.  Neck Carotid Arteries- Normal color. Moisture- Normal Moisture. No carotid bruits. No JVD.  Chest and Lung Exam Auscultation: Breath Sounds:-Normal.  Cardiovascular Auscultation:Rythm- Regular. Murmurs & Other Heart Sounds:Auscultation of the heart reveals- No Murmurs.  Neurologic Cranial Nerve exam:- CN III-XII intact(No nystagmus), symmetric smile. Strength:- 5/5 equal and symmetric strength both upper and lower extremities.     Assessment & Plan:    For history of migraines, I want you to continue with the regimen of Topamax and Zomig.  I did go ahead and fill out your FMLA form as we discussed.  Hopefully your work will have an issue with the form.  If so please let us know.  I asked our staff to make copy of the form and we gave you the original to turn in.  For  recent smoking, I did go ahead and write a Wellbutrin.  Hopefully this will help you quit smoking quickly as it did last time.  Your blood pressure was high initially when Checked.  But on recheck manually without your shorter on the blood pressure was in the 138/88 range and you had not taken your medication.  Continue current BP medication.  Follow-up in 2 to 3 months.  Recommend that you get wellness exam.  Esperanza Richters, PA-C

## 2018-07-20 NOTE — Patient Instructions (Addendum)
For history of migraines, I want you to continue with the regimen of Topamax and Zomig.  I did go ahead and fill out your FMLA form as we discussed.  Hopefully your work will have an issue with the form.  If so please let us know.  I asked our staff to make copy of the form and we gave you the original to turn in.  For recent smoking, I did go ahead and write a Wellbutrin.  Hopefully this will help you quit smoking quickly as it did last time.  Your blood pressure was high initially when Checked.  But on recheck manually without your shorter on the blood pressure was in the 138/88 range and you had not taken your medication.  Continue current BP medication.  Follow-up in 2 to 3 months.  Recommend that you get wellness exam.

## 2018-08-17 ENCOUNTER — Encounter: Payer: Self-pay | Admitting: Medical

## 2018-08-19 ENCOUNTER — Other Ambulatory Visit: Payer: Self-pay | Admitting: Medical

## 2018-08-23 ENCOUNTER — Other Ambulatory Visit: Payer: Self-pay | Admitting: Medical

## 2018-08-23 ENCOUNTER — Encounter: Payer: Self-pay | Admitting: Medical

## 2018-08-23 MED ORDER — VENLAFAXINE HCL ER 75 MG PO CP24: 75.0000 mg | ORAL_CAPSULE | Freq: Every day | ORAL | 0 refills | Status: DC

## 2018-08-25 ENCOUNTER — Ambulatory Visit: Payer: BLUE CROSS/BLUE SHIELD | Admitting: Medical

## 2018-08-25 ENCOUNTER — Telehealth: Payer: Self-pay | Admitting: Medical

## 2018-08-25 ENCOUNTER — Encounter: Payer: Self-pay | Admitting: Medical

## 2018-08-25 VITALS — BP 137/87 | HR 93 | Temp 98.1°F | Resp 16 | Ht 65.0 in | Wt 191.4 lb

## 2018-08-25 DIAGNOSIS — F17200 Nicotine dependence, unspecified, uncomplicated: Secondary | ICD-10-CM | POA: Diagnosis not present

## 2018-08-25 DIAGNOSIS — R062 Wheezing: Secondary | ICD-10-CM

## 2018-08-25 DIAGNOSIS — R059 Cough, unspecified: Secondary | ICD-10-CM

## 2018-08-25 DIAGNOSIS — J40 Bronchitis, not specified as acute or chronic: Secondary | ICD-10-CM | POA: Diagnosis not present

## 2018-08-25 DIAGNOSIS — I10 Essential (primary) hypertension: Secondary | ICD-10-CM

## 2018-08-25 DIAGNOSIS — R05 Cough: Secondary | ICD-10-CM | POA: Diagnosis not present

## 2018-08-25 DIAGNOSIS — R51 Headache: Secondary | ICD-10-CM

## 2018-08-25 DIAGNOSIS — R519 Headache, unspecified: Secondary | ICD-10-CM

## 2018-08-25 MED ORDER — HYDROCODONE-HOMATROPINE 5-1.5 MG/5ML PO SYRP: 5.0000 mL | ORAL_SOLUTION | Freq: Three times a day (TID) | ORAL | 0 refills | Status: DC | PRN

## 2018-08-25 MED ORDER — AZITHROMYCIN 250 MG PO TABS: ORAL_TABLET | ORAL | 0 refills | Status: DC

## 2018-08-25 MED ORDER — TOPIRAMATE 25 MG PO TABS: ORAL_TABLET | ORAL | 3 refills | Status: DC

## 2018-08-25 MED ORDER — CYCLOBENZAPRINE HCL 5 MG PO TABS: 5.0000 mg | ORAL_TABLET | Freq: Every day | ORAL | 0 refills | Status: DC

## 2018-08-25 MED ORDER — ZOLMITRIPTAN 2.5 MG PO TBDP: 2.5000 mg | ORAL_TABLET | ORAL | 0 refills | Status: DC | PRN

## 2018-08-25 MED ORDER — ALBUTEROL SULFATE HFA 108 (90 BASE) MCG/ACT IN AERS: 2.0000 | INHALATION_SPRAY | Freq: Four times a day (QID) | RESPIRATORY_TRACT | 2 refills | Status: DC | PRN

## 2018-08-25 NOTE — Patient Instructions (Signed)
You do appear to have some bronchitis type symptoms are recently.  For cough, I am prescribing Hycodan.  Rx advisement given regarding sedation side effects.  Not to drive while using Hycodan.  I am prescribing a azithromycin antibiotic.  For wheezing, I prescribed albuterol inhaler.  Smoking may be contributing factor to the recent bronchitis.  Wellbutrin is no longer helping.  Chantix is contraindicated with near mood history.  I could give you low-dose nicotine patch and had to stop smoking.  This would be less irritating to the lungs.  You declined this today but let me know if you change your mind.  History of migraines recently fairly controlled with Topamax and Zomig.  We have the forms to fill out and will modify FMLA to state up to 2 days.  Recent trapezius/posterior neck region tenderness.  This may be related to stress.  I did prescribe Flexeril muscle relaxant to take 1 tablet at night.  If you get any worsening signs symptoms associated with this so please let me know.  Your blood pressure was better when I checked behind medical assistant.  Would asked that you get your blood pressure cuff and check your blood pressure daily for the next week.  Then send me a message on those readings.  If blood pressure elevation above 140/90 then would need to modify your BP medication regimen.  Follow-up in 10 to 14 days or as needed.

## 2018-08-25 NOTE — Progress Notes (Signed)
Subjective:    Patient ID: Katie Merritt, female    DOB: 1971/05/30, 47 y.o.   MRN: [id number]  HPI  Pt in for follow up.  Pt states her fmla needs to be modified. She needs to have it stated up to 2 days. She brings the new form in.  Pt has recent cough. Pt states on Sunday had acute onset of chills and body aches moderate. Aches was moderate but only one day. Now some wheezing. Coughing moderate to severe. Cough worse with talking. Pt has no history of asthma. But smokes.  Back on neck pain been hurting for 2 weeks. Has moderate high level work stress recently. No neck stiffenss or fever.(no subjective apart from Sunday viral syndrome event). No neck stiffness, no vomiting and no ha presently.  Pt is still smoking. Smoking 2 black and milds a day.   Review of Systems  Constitutional: Negative for chills, fatigue and fever.  HENT: Positive for congestion. Negative for sinus pressure and sinus pain.   Respiratory: Positive for cough and wheezing. Negative for apnea, chest tightness and shortness of breath.        Wheezing more at night.  Cardiovascular: Negative for chest pain and palpitations.  Gastrointestinal: Negative for abdominal pain.  Musculoskeletal: Negative for back pain.  Skin: Negative for rash.  Neurological: Negative for dizziness, seizures, weakness and headaches.       No recent migraine this weeks. Last one 2 weeks ago. She is on topamax and it helps.  Hematological: Negative for adenopathy. Does not bruise/bleed easily.  Psychiatric/Behavioral: Negative for behavioral problems and confusion.    Past Medical History:  Diagnosis Date  . Anxiety   . COPD (chronic obstructive pulmonary disease) (HCC)   . Hyperlipidemia   . Hypertension      Social History   Socioeconomic History  . Marital status: Single    Spouse name: Not on file  . Number of children: Not on file  . Years of education: Not on file  . Highest education level: Not on file    Occupational History  . Not on file  Social Needs  . Financial resource strain: Not on file  . Food insecurity:    Worry: Not on file    Inability: Not on file  . Transportation needs:    Medical: Not on file    Non-medical: Not on file  Tobacco Use  . Smoking status: Former Smoker    Last attempt to quit: 11/25/2013    Years since quitting: 4.7  . Smokeless tobacco: Never Used  Substance and Sexual Activity  . Alcohol use: Yes    Alcohol/week: 0.0 standard drinks    Comment: Light Smoker Only  . Drug use: Not on file  . Sexual activity: Not on file  Lifestyle  . Physical activity:    Days per week: Not on file    Minutes per session: Not on file  . Stress: Not on file  Relationships  . Social connections:    Talks on phone: Not on file    Gets together: Not on file    Attends religious service: Not on file    Active member of club or organization: Not on file    Attends meetings of clubs or organizations: Not on file    Relationship status: Not on file  . Intimate partner violence:    Fear of current or ex partner: Not on file    Emotionally abused: Not on file  Physically abused: Not on file    Forced sexual activity: Not on file  Other Topics Concern  . Not on file  Social History Narrative  . Not on file    Past Surgical History:  Procedure Laterality Date  . Leap    . TUBAL LIGATION      Family History  Problem Relation Age of Onset  . Hyperlipidemia Mother   . Hypertension Mother     No Known Allergies  Current Outpatient Medications on File Prior to Visit  Medication Sig Dispense Refill  . ALPRAZolam (XANAX) 0.5 MG tablet Take 1 tablet (0.5 mg total) by mouth at bedtime as needed for anxiety. 14 tablet 0  . amLODipine-olmesartan (AZOR) 5-40 MG tablet TAKE 1 TABLET BY MOUTH EVERY DAY 90 tablet 1  . atorvastatin (LIPITOR) 40 MG tablet Take 1 tablet (40 mg total) by mouth daily. 30 tablet 2  . atorvastatin (LIPITOR) 80 MG tablet Take 80 mg by  mouth at bedtime.    Marland Kitchen buPROPion (WELLBUTRIN SR) 150 MG 12 hr tablet TAKE 1 TABLET BY MOUTH EVERY DAY 30 tablet 1  . fluconazole (DIFLUCAN) 150 MG tablet 1 tab po for one day. Repeat in 5-7 days if necessar 2 tablet 0  . levonorgestrel (MIRENA, 52 MG,) 20 MCG/24HR IUD Mirena 20 mcg/24 hours (5 yrs) 52 mg intrauterine device  Take 1 device by intrauterine route.    . metroNIDAZOLE (FLAGYL) 500 MG tablet Take 1 tablet (500 mg total) by mouth 3 (three) times daily. 21 tablet 0  . Multiple Vitamins-Minerals (WOMENS MULTIVITAMIN PO) Take by mouth daily.    Marland Kitchen SAXENDA 18 MG/3ML SOPN INJECT 3 MG AS DIRECTED DAILY. 15 mL 2  . topiramate (TOPAMAX) 25 MG tablet 1 tab po q hs for one week then twice daily 50 tablet 0  . ULTICARE SHORT PEN NEEDLES 31G X 8 MM MISC USE AS DIRECTED TO INJECT SAXENDA 100 each 0  . venlafaxine XR (EFFEXOR-XR) 75 MG 24 hr capsule Take 1 capsule (75 mg total) by mouth daily with breakfast. 90 capsule 0  . zolmitriptan (ZOMIG-ZMT) 2.5 MG disintegrating tablet Take 1 tablet (2.5 mg total) by mouth as needed for migraine. May repeat 2 hours if ha persist. 10 tablet 0   No current facility-administered medications on file prior to visit.     BP (!) 135/104   Pulse 93   Temp 98.1 F (36.7 C) (Oral)   Resp 16   Ht 5\' 5"  (1.651 m)   Wt 191 lb 6.4 oz (86.8 kg)   SpO2 100%   BMI 31.85 kg/m       Objective:   Physical Exam  General  Mental Status - Alert. General Appearance - Well groomed. Not in acute distress.moderate to severe cough.  Skin Rashes- No Rashes.  HEENT Head- Normal. Ear Auditory Canal - Left- Normal. Right - Normal.Tympanic Membrane- Left- Normal. Right- Normal. Eye Sclera/Conjunctiva- Left- Normal. Right- Normal. Nose & Sinuses Nasal Mucosa- Left-  Boggy and Congested. Right-  Boggy and  Congested.Bilateral no  maxillary and  No frontal sinus pressure. Mouth & Throat Lips: Upper Lip- Normal: no dryness, cracking, pallor, cyanosis, or vesicular  eruption. Lower Lip-Normal: no dryness, cracking, pallor, cyanosis or vesicular eruption. Buccal Mucosa- Bilateral- No Aphthous ulcers. Oropharynx- No Discharge or Erythema. Tonsils: Characteristics- Bilateral- No Erythema or Congestion. Size/Enlargement- Bilateral- No enlargement. Discharge- bilateral-None.  Neck Neck- Supple. No Masses. Trapezius tender to palpation no neck stiffness.   Chest and Lung Exam Auscultation: Breath  Sounds:-Clear even and unlabored.  Cardiovascular Auscultation:Rythm- Regular, rate and rhythm. Murmurs & Other Heart Sounds:Ausculatation of the heart reveal- No Murmurs.  Lymphatic Head & Neck General Head & Neck Lymphatics: Bilateral: Description- No Localized lymphadenopathy.    Neurologic Cranial Nerve exam:- CN III-XII intact(No nystagmus), symmetric smile. Normal/IntactStrength:- 5/5 equal and symmetric strength both upper and lower extremities.       Assessment & Plan:  You do appear to have some bronchitis type symptoms are recently.  For cough, I am prescribing Hycodan.  Rx advisement given regarding sedation side effects.  Not to drive while using Hycodan.  I am prescribing a azithromycin antibiotic.  For wheezing, I prescribed albuterol inhaler.  Smoking may be contributing factor to the recent bronchitis.  Wellbutrin is no longer helping.  Chantix is contraindicated with near mood history.  I could give you low-dose nicotine patch and had to stop smoking.  This would be less irritating to the lungs.  You declined this today but let me know if you change your mind.  History of migraines recently fairly controlled with Topamax and Zomig.  We have the forms to fill out and will modify FMLA to state up to 2 days.  Recent trapezius/posterior neck region tenderness.  This may be related to stress.  I did prescribe Flexeril muscle relaxant to take 1 tablet at night.  If you get any worsening signs symptoms associated with this so please let me  know.  Your blood pressure was better when I checked behind medical assistant.  Would asked that you get your blood pressure cuff and check your blood pressure daily for the next week.  Then send me a message on those readings.  If blood pressure elevation above 140/90 then would need to modify your BP medication regimen.  Follow-up in 10 to 14 days or as needed.  Esperanza Richters, PA-C

## 2018-08-25 NOTE — Telephone Encounter (Signed)
Will you help fill out her fmla form by Wednesday or next week. Fill out same as before but give up to 2 days off.  Will let me know when you have the form ready.

## 2018-09-08 ENCOUNTER — Telehealth: Payer: Self-pay | Admitting: Medical

## 2018-09-08 DIAGNOSIS — Z0279 Encounter for issue of other medical certificate: Secondary | ICD-10-CM

## 2018-09-08 NOTE — Telephone Encounter (Signed)
I filled out fmla forms today. Will you fax over forms. Please make copies so we can modify next forms if needed on next renewal for paper work.

## 2018-09-08 NOTE — Telephone Encounter (Signed)
Faxed FMLA forms to [employer] [facility] [phone]

## 2018-09-11 ENCOUNTER — Encounter: Payer: Self-pay | Admitting: Medical

## 2018-09-11 NOTE — Telephone Encounter (Signed)
Pt came and pick up documents (FMLA)

## 2018-10-03 ENCOUNTER — Ambulatory Visit: Payer: Self-pay | Admitting: *Deleted

## 2018-10-03 NOTE — Telephone Encounter (Signed)
Patient is at the dentist and they are getting high reading- patient is going to cancel the appointment and will come to office to see if her medication needs to be changed.  Reason for Disposition . Systolic BP  >= 741 OR Diastolic >= 423  Answer Assessment - Initial Assessment Questions 1. BLOOD PRESSURE: "What is the blood pressure?" "Did you take at least two measurements 5 minutes apart?"     171/112, 167/117 2. ONSET: "When did you take your blood pressure?"     3:15 pm 3. HOW: "How did you obtain the blood pressure?" (e.g., visiting nurse, automatic home BP monitor)     Automatic cuff 4. HISTORY: "Do you have a history of high blood pressure?"     yes 5. MEDICATIONS: "Are you taking any medications for blood pressure?" "Have you missed any doses recently?"     Yes- did not take dose yesterday 6. OTHER SYMPTOMS: "Do you have any symptoms?" (e.g., headache, chest pain, blurred vision, difficulty breathing, weakness)     no 7. PREGNANCY: "Is there any chance you are pregnant?" "When was your last menstrual period?"     No- Mirena IUD  Protocols used: HIGH BLOOD PRESSURE-A-AH

## 2018-10-04 ENCOUNTER — Encounter: Payer: Self-pay | Admitting: Medical

## 2018-10-04 ENCOUNTER — Ambulatory Visit: Payer: BLUE CROSS/BLUE SHIELD | Admitting: Medical

## 2018-10-04 VITALS — BP 170/100 | HR 100 | Temp 98.1°F | Resp 16 | Ht 65.0 in | Wt 196.6 lb

## 2018-10-04 DIAGNOSIS — F17200 Nicotine dependence, unspecified, uncomplicated: Secondary | ICD-10-CM

## 2018-10-04 DIAGNOSIS — E785 Hyperlipidemia, unspecified: Secondary | ICD-10-CM

## 2018-10-04 DIAGNOSIS — I10 Essential (primary) hypertension: Secondary | ICD-10-CM | POA: Diagnosis not present

## 2018-10-04 MED ORDER — HYDROCHLOROTHIAZIDE 12.5 MG PO CAPS: 12.5000 mg | ORAL_CAPSULE | Freq: Every day | ORAL | 3 refills | Status: DC

## 2018-10-04 MED ORDER — AMLODIPINE-OLMESARTAN 10-40 MG PO TABS: 1.0000 | ORAL_TABLET | Freq: Every day | ORAL | 5 refills | Status: DC

## 2018-10-04 NOTE — Patient Instructions (Signed)
Your BP is quite high today compared to previous readings.  We went over cardiovascular risk score today.  I am prescribing you higher dose of amlodipine 10/40 in place of the 5/40 dose.  Start taking higher dose today and checking blood pressure daily.  If by 1 week your blood pressure is not less than 140/90 then add HCTZ 12.5mg  diuretic dose.  If you have any cardiac or neurologic signs symptoms then advised ED evaluation.  Please stop smoking.  Future fasting metabolic panel and lipid panel to be done within the next 4 to 5 days.  Follow-up in 2 weeks or as needed.

## 2018-10-04 NOTE — Progress Notes (Signed)
Subjective:    Patient ID: Katie Merritt, female    DOB: Mar 23, 1971, 47 y.o.   MRN: [id number]  HPI  Pt has htn. She does not have her bp cuff at home. She states her cuff is in storage. Pt has been trying to get teeth cleaned but could not due to her blood pressure being elevated.  In past bp reading have been elevated initially in office  but better on rechecks after patients sits and relaxes.  Pt had no cardiac or neurologic signs or symptoms.  Pt admits non compliance on weekend on meds. And noncompliance this past summer.  Pt is still smoker.    Review of Systems  Constitutional: Negative for chills, fatigue and fever.  Respiratory: Negative for cough, chest tightness, shortness of breath and wheezing.   Cardiovascular: Negative for chest pain and palpitations.  Gastrointestinal: Negative for abdominal pain.  Genitourinary: Negative for difficulty urinating, dysuria, flank pain and frequency.  Musculoskeletal: Negative for back pain.  Skin: Negative for rash.  Neurological: Negative for dizziness, tremors, weakness and headaches.  Hematological: Negative for adenopathy. Does not bruise/bleed easily.  Psychiatric/Behavioral: Negative for behavioral problems, confusion and sleep disturbance. The patient is not nervous/anxious and is not hyperactive.     Past Medical History:  Diagnosis Date  . Anxiety   . COPD (chronic obstructive pulmonary disease) (HCC)   . Hyperlipidemia   . Hypertension      Social History   Socioeconomic History  . Marital status: Single    Spouse name: Not on file  . Number of children: Not on file  . Years of education: Not on file  . Highest education level: Not on file  Occupational History  . Not on file  Social Needs  . Financial resource strain: Not on file  . Food insecurity:    Worry: Not on file    Inability: Not on file  . Transportation needs:    Medical: Not on file    Non-medical: Not on file  Tobacco Use  . Smoking  status: Former Smoker    Last attempt to quit: 11/25/2013    Years since quitting: 4.8  . Smokeless tobacco: Never Used  Substance and Sexual Activity  . Alcohol use: Yes    Alcohol/week: 0.0 standard drinks    Comment: Light Smoker Only  . Drug use: Not on file  . Sexual activity: Not on file  Lifestyle  . Physical activity:    Days per week: Not on file    Minutes per session: Not on file  . Stress: Not on file  Relationships  . Social connections:    Talks on phone: Not on file    Gets together: Not on file    Attends religious service: Not on file    Active member of club or organization: Not on file    Attends meetings of clubs or organizations: Not on file    Relationship status: Not on file  . Intimate partner violence:    Fear of current or ex partner: Not on file    Emotionally abused: Not on file    Physically abused: Not on file    Forced sexual activity: Not on file  Other Topics Concern  . Not on file  Social History Narrative  . Not on file    Past Surgical History:  Procedure Laterality Date  . Leap    . TUBAL LIGATION      Family History  Problem Relation Age of Onset  .  Hyperlipidemia Mother   . Hypertension Mother     No Known Allergies  Current Outpatient Medications on File Prior to Visit  Medication Sig Dispense Refill  . albuterol (PROVENTIL HFA;VENTOLIN HFA) 108 (90 Base) MCG/ACT inhaler Inhale 2 puffs into the lungs every 6 (six) hours as needed for wheezing or shortness of breath. 1 Inhaler 2  . ALPRAZolam (XANAX) 0.5 MG tablet Take 1 tablet (0.5 mg total) by mouth at bedtime as needed for anxiety. 14 tablet 0  . amLODipine-olmesartan (AZOR) 5-40 MG tablet TAKE 1 TABLET BY MOUTH EVERY DAY 90 tablet 1  . atorvastatin (LIPITOR) 40 MG tablet Take 1 tablet (40 mg total) by mouth daily. 30 tablet 2  . atorvastatin (LIPITOR) 80 MG tablet Take 80 mg by mouth at bedtime.    . cyclobenzaprine (FLEXERIL) 5 MG tablet Take 1 tablet (5 mg total) by  mouth at bedtime. 7 tablet 0  . fluconazole (DIFLUCAN) 150 MG tablet 1 tab po for one day. Repeat in 5-7 days if necessar 2 tablet 0  . HYDROcodone-homatropine (HYCODAN) 5-1.5 MG/5ML syrup Take 5 mLs by mouth every 8 (eight) hours as needed. 100 mL 0  . levonorgestrel (MIRENA, 52 MG,) 20 MCG/24HR IUD Mirena 20 mcg/24 hours (5 yrs) 52 mg intrauterine device  Take 1 device by intrauterine route.    . metroNIDAZOLE (FLAGYL) 500 MG tablet Take 1 tablet (500 mg total) by mouth 3 (three) times daily. 21 tablet 0  . Multiple Vitamins-Minerals (WOMENS MULTIVITAMIN PO) Take by mouth daily.    Marland Kitchen SAXENDA 18 MG/3ML SOPN INJECT 3 MG AS DIRECTED DAILY. 15 mL 2  . topiramate (TOPAMAX) 25 MG tablet 1 tab po  twice daily 60 tablet 3  . ULTICARE SHORT PEN NEEDLES 31G X 8 MM MISC USE AS DIRECTED TO INJECT SAXENDA 100 each 0  . venlafaxine XR (EFFEXOR-XR) 75 MG 24 hr capsule Take 1 capsule (75 mg total) by mouth daily with breakfast. 90 capsule 0  . zolmitriptan (ZOMIG-ZMT) 2.5 MG disintegrating tablet Take 1 tablet (2.5 mg total) by mouth as needed for migraine. May repeat 2 hours if ha persist. 10 tablet 0   No current facility-administered medications on file prior to visit.     BP (!) 176/111   Pulse 100   Temp 98.1 F (36.7 C) (Oral)   Resp 16   Ht 5\' 5"  (1.651 m)   Wt 196 lb 9.6 oz (89.2 kg)   SpO2 100%   BMI 32.72 kg/m       Objective:   Physical Exam  General Mental Status- Alert. General Appearance- Not in acute distress.   Skin General: Color- Normal Color. Moisture- Normal Moisture.  Neck Carotid Arteries- Normal color. Moisture- Normal Moisture. No carotid bruits. No JVD.  Chest and Lung Exam Auscultation: Breath Sounds:-Normal.  Cardiovascular Auscultation:Rythm- Regular. Murmurs & Other Heart Sounds:Auscultation of the heart reveals- No Murmurs.  Abdomen Inspection:-Inspeection Normal. Palpation/Percussion:Note:No mass. Palpation and Percussion of the abdomen reveal- Non  Tender, Non Distended + BS, no rebound or guarding.  Neurologic Cranial Nerve exam:- CN III-XII intact(No nystagmus), symmetric smile. Strength:- 5/5 equal and symmetric strength both upper and lower extremities.      Assessment & Plan:  Your BP is quite high today compared to previous readings.  We went over cardiovascular risk score today.  I am prescribing you higher dose of amlodipine 10/40 in place of the 5/40 dose.  Start taking higher dose today and checking blood pressure daily.  If by 1  week your blood pressure is not less than 140/90 then add HCTZ 12.5mg  diuretic dose.  If you have any cardiac or neurologic signs symptoms then advised ED evaluation.  Please stop smoking.  Future fasting metabolic panel and lipid panel to be done within the next 4 to 5 days.  Follow-up in 2 weeks or as needed.  Esperanza Richters, PA-C

## 2018-10-09 ENCOUNTER — Encounter: Payer: Self-pay | Admitting: Medical

## 2018-10-09 ENCOUNTER — Other Ambulatory Visit (INDEPENDENT_AMBULATORY_CARE_PROVIDER_SITE_OTHER): Payer: BLUE CROSS/BLUE SHIELD

## 2018-10-09 DIAGNOSIS — E785 Hyperlipidemia, unspecified: Secondary | ICD-10-CM | POA: Diagnosis not present

## 2018-10-09 DIAGNOSIS — I10 Essential (primary) hypertension: Secondary | ICD-10-CM | POA: Diagnosis not present

## 2018-10-10 LAB — COMPREHENSIVE METABOLIC PANEL
ALK PHOS: 85 U/L (ref 39–117)
ALT: 10 U/L (ref 0–35)
AST: 11 U/L (ref 0–37)
Albumin: 4.3 g/dL (ref 3.5–5.2)
BILIRUBIN TOTAL: 0.7 mg/dL (ref 0.2–1.2)
BUN: 13 mg/dL (ref 6–23)
CO2: 25 meq/L (ref 19–32)
Calcium: 9 mg/dL (ref 8.4–10.5)
Chloride: 101 mEq/L (ref 96–112)
Creatinine, Ser: 0.93 mg/dL (ref 0.40–1.20)
GFR: 83.27 mL/min (ref >60.00)
GLUCOSE: 89 mg/dL (ref 70–99)
Potassium: 4 mEq/L (ref 3.5–5.1)
SODIUM: 134 meq/L — AB (ref 135–145)
TOTAL PROTEIN: 6.7 g/dL (ref 6.0–8.3)

## 2018-10-10 LAB — LIPID PANEL
CHOL/HDL RATIO: 5
Cholesterol: 244 mg/dL — ABNORMAL HIGH (ref 0–200)
HDL: 47.7 mg/dL (ref >39.00)
LDL Cholesterol: 173 mg/dL — ABNORMAL HIGH (ref 0–99)
NONHDL: 196.12
Triglycerides: 116 mg/dL (ref 0.0–149.0)
VLDL: 23.2 mg/dL (ref 0.0–40.0)

## 2018-10-11 NOTE — Telephone Encounter (Signed)
Attempt to reach pt via phone, mailbox is full, left SMS message with call back number [pager?]; pt needs OV to complete paperwork in question from 09/08/18 per provider/SLS 12/18

## 2018-10-23 ENCOUNTER — Encounter: Payer: Self-pay | Admitting: Medical

## 2018-10-23 ENCOUNTER — Ambulatory Visit (INDEPENDENT_AMBULATORY_CARE_PROVIDER_SITE_OTHER): Payer: BLUE CROSS/BLUE SHIELD | Admitting: Medical

## 2018-10-23 VITALS — HR 78 | Temp 98.2°F | Resp 16 | Ht 65.0 in | Wt 191.0 lb

## 2018-10-23 DIAGNOSIS — R519 Headache, unspecified: Secondary | ICD-10-CM

## 2018-10-23 DIAGNOSIS — R51 Headache: Secondary | ICD-10-CM

## 2018-10-23 NOTE — Patient Instructions (Addendum)
For your history of migraine headaches I did not modify the [facility] page section 19.  I did leave the frequency of episodes at Home.  We discussed the fact that you sometimes need 2 days per episode as headache may start late afternoon before your workday is ended and then you might have lingering headache the next day.  It may be helpful for you to write down exact number of days you are missing from work per month and then we could provide more conservative estimation on the number of times/episodes per month.  Currently recommend that you continue Topamax and Zomig.  Would offer you referral to neurologist as well.  On review of your chart would also recommend that you get scheduled for a complete physical/wellness exam we can recheck your lipid panel and other routine labs.

## 2018-10-23 NOTE — Progress Notes (Signed)
Subjective:    Patient ID: Katie Merritt, female    DOB: 02-11-1971, 47 y.o.   MRN: [id number]  HPI   Pt in for follow up.  To address paperwork. Needs Start Date and End Date filled out.  Pt needs 2 consecutive day in a row specified on form(8 times per episode). They wanted evidence to back up that she needs this much time off.  Currently she states that are limiting her to one day. Even if she leaves late in the day with migraine they expect her to have to come back next day. Pt estimates 5 migraine ha a month.   I had put 8 episodes in event she needed. Pt has not used 16 days per month.       Review of Systems  Constitutional: Negative for chills, fatigue and fever.  Respiratory: Negative for cough, chest tightness, shortness of breath and wheezing.   Cardiovascular: Negative for chest pain and palpitations.  Genitourinary: Negative for dysuria.  Musculoskeletal: Negative for back pain, neck pain and neck stiffness.  Skin: Negative for rash.  Neurological: Negative for dizziness, seizures, weakness, numbness and headaches.       Hx of migraine ha.  Hematological: Negative for adenopathy. Does not bruise/bleed easily.  Psychiatric/Behavioral: Negative for behavioral problems and confusion.    Past Medical History:  Diagnosis Date  . Anxiety   . COPD (chronic obstructive pulmonary disease) (HCC)   . Hyperlipidemia   . Hypertension      Social History   Socioeconomic History  . Marital status: Single    Spouse name: Not on file  . Number of children: Not on file  . Years of education: Not on file  . Highest education level: Not on file  Occupational History  . Not on file  Social Needs  . Financial resource strain: Not on file  . Food insecurity:    Worry: Not on file    Inability: Not on file  . Transportation needs:    Medical: Not on file    Non-medical: Not on file  Tobacco Use  . Smoking status: Former Smoker    Last attempt to quit: 11/25/2013    Years since quitting: 4.9  . Smokeless tobacco: Never Used  Substance and Sexual Activity  . Alcohol use: Yes    Alcohol/week: 0.0 standard drinks    Comment: Light Smoker Only  . Drug use: Not on file  . Sexual activity: Not on file  Lifestyle  . Physical activity:    Days per week: Not on file    Minutes per session: Not on file  . Stress: Not on file  Relationships  . Social connections:    Talks on phone: Not on file    Gets together: Not on file    Attends religious service: Not on file    Active member of club or organization: Not on file    Attends meetings of clubs or organizations: Not on file    Relationship status: Not on file  . Intimate partner violence:    Fear of current or ex partner: Not on file    Emotionally abused: Not on file    Physically abused: Not on file    Forced sexual activity: Not on file  Other Topics Concern  . Not on file  Social History Narrative  . Not on file    Past Surgical History:  Procedure Laterality Date  . Leap    . TUBAL LIGATION  Family History  Problem Relation Age of Onset  . Hyperlipidemia Mother   . Hypertension Mother     No Known Allergies  Current Outpatient Medications on File Prior to Visit  Medication Sig Dispense Refill  . albuterol (PROVENTIL HFA;VENTOLIN HFA) 108 (90 Base) MCG/ACT inhaler Inhale 2 puffs into the lungs every 6 (six) hours as needed for wheezing or shortness of breath. 1 Inhaler 2  . ALPRAZolam (XANAX) 0.5 MG tablet Take 1 tablet (0.5 mg total) by mouth at bedtime as needed for anxiety. 14 tablet 0  . amLODipine-olmesartan (AZOR) 10-40 MG tablet Take 1 tablet by mouth daily. 30 tablet 5  . atorvastatin (LIPITOR) 40 MG tablet Take 1 tablet (40 mg total) by mouth daily. 30 tablet 2  . atorvastatin (LIPITOR) 80 MG tablet Take 80 mg by mouth at bedtime.    . cyclobenzaprine (FLEXERIL) 5 MG tablet Take 1 tablet (5 mg total) by mouth at bedtime. 7 tablet 0  . fluconazole (DIFLUCAN) 150 MG  tablet 1 tab po for one day. Repeat in 5-7 days if necessar 2 tablet 0  . hydrochlorothiazide (MICROZIDE) 12.5 MG capsule Take 1 capsule (12.5 mg total) by mouth daily. 30 capsule 3  . HYDROcodone-homatropine (HYCODAN) 5-1.5 MG/5ML syrup Take 5 mLs by mouth every 8 (eight) hours as needed. 100 mL 0  . levonorgestrel (MIRENA, 52 MG,) 20 MCG/24HR IUD Mirena 20 mcg/24 hours (5 yrs) 52 mg intrauterine device  Take 1 device by intrauterine route.    . metroNIDAZOLE (FLAGYL) 500 MG tablet Take 1 tablet (500 mg total) by mouth 3 (three) times daily. 21 tablet 0  . Multiple Vitamins-Minerals (WOMENS MULTIVITAMIN PO) Take by mouth daily.    Marland Kitchen SAXENDA 18 MG/3ML SOPN INJECT 3 MG AS DIRECTED DAILY. 15 mL 2  . topiramate (TOPAMAX) 25 MG tablet 1 tab po  twice daily 60 tablet 3  . ULTICARE SHORT PEN NEEDLES 31G X 8 MM MISC USE AS DIRECTED TO INJECT SAXENDA 100 each 0  . venlafaxine XR (EFFEXOR-XR) 75 MG 24 hr capsule Take 1 capsule (75 mg total) by mouth daily with breakfast. 90 capsule 0  . zolmitriptan (ZOMIG-ZMT) 2.5 MG disintegrating tablet Take 1 tablet (2.5 mg total) by mouth as needed for migraine. May repeat 2 hours if ha persist. 10 tablet 0   No current facility-administered medications on file prior to visit.     Pulse 78   Temp 98.2 F (36.8 C) (Oral)   Resp 16   Ht 5\' 5"  (1.651 m)   Wt 191 lb (86.6 kg)   SpO2 100%   BMI 31.78 kg/m       Objective:   Physical Exam  General Mental Status- Alert. General Appearance- Not in acute distress.    Chest and Lung Exam Auscultation: Breath Sounds:-Normal.  Cardiovascular Auscultation:Rythm- Regular. Murmurs & Other Heart Sounds:Auscultation of the heart reveals- No Murmurs.   Neurologic Cranial Nerve exam:- CN III-XII intact(No nystagmus), symmetric smile. Strength:- 5/5 equal and symmetric strength both upper and lower extremities.    Assessment & Plan:  For your history of migraine headaches I did not modify the [facility] page  section 19.  I did leave the frequency of episodes at 8/month.  We discussed the fact that you sometimes need 2 days per episode as headache may start late afternoon before your workday is ended and then you might have lingering headache the next day.  It may be helpful for you to write down exact number of days you  are missing from work per month and then we could provide more conservative estimation on the number of times/episodes per month.  Currently recommend that you continue Topamax and Zomig.  Would offer you referral to neurologist as well.(pt did decline referral presently. Though did offer today)  On review of your chart would also recommend that you get scheduled for a complete physical/wellness exam we can recheck your lipid panel and other routine labs.

## 2018-11-06 ENCOUNTER — Encounter: Payer: Self-pay | Admitting: Medical

## 2018-11-07 ENCOUNTER — Encounter: Payer: Self-pay | Admitting: Medical

## 2018-11-07 ENCOUNTER — Ambulatory Visit (INDEPENDENT_AMBULATORY_CARE_PROVIDER_SITE_OTHER): Payer: BLUE CROSS/BLUE SHIELD | Admitting: Medical

## 2018-11-07 VITALS — BP 128/80 | HR 88 | Temp 98.2°F | Resp 16 | Ht 65.0 in | Wt 191.0 lb

## 2018-11-07 DIAGNOSIS — R51 Headache: Secondary | ICD-10-CM | POA: Diagnosis not present

## 2018-11-07 DIAGNOSIS — F419 Anxiety disorder, unspecified: Secondary | ICD-10-CM

## 2018-11-07 DIAGNOSIS — Z79899 Other long term (current) drug therapy: Secondary | ICD-10-CM

## 2018-11-07 DIAGNOSIS — R519 Headache, unspecified: Secondary | ICD-10-CM

## 2018-11-07 MED ORDER — ALPRAZOLAM 0.25 MG PO TABS: ORAL_TABLET | ORAL | 0 refills | Status: DC
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
## 2018-11-07 NOTE — Patient Instructions (Addendum)
I did fill out paperwork today in the office again.  Hopefully this will need your work/HR requirements.  If not please let me know.  Please make copies of form upfront for your records and after front to make a copy for our records as well.  Continue current medications for your migraine headaches.  Keep in mind as explained last time that I could refer you to neurologist if you agree with referral.  For anxiety, we discussed side effects of Xanax particularly at the higher dose as you experienced sedation at the 0.5 mg.  You note that previously when you took 0.25 mg you had no side effects/no excessive sedation.  So I wrote 15 tablet prescription and sent that to your pharmacy.  You signed controlled medication contract and will give urine drug screen today.  I would asked that you MyChart me or call 2 to 3 days before you run out of your Xanax prescriptions.  We will need to follow-up in about 4 months for controlled medication office visit.  Or sooner if needed.

## 2018-11-11 LAB — PAIN MGMT, PROFILE 8 W/CONF, U
6 Acetylmorphine: NEGATIVE ng/mL (ref <10)
ALCOHOL METABOLITES: POSITIVE ng/mL — AB (ref <500)
Amphetamines: NEGATIVE ng/mL (ref <500)
BENZODIAZEPINES: NEGATIVE ng/mL (ref <100)
Buprenorphine, Urine: NEGATIVE ng/mL (ref <5)
CREATININE: 73.8 mg/dL
Cocaine Metabolite: NEGATIVE ng/mL (ref <150)
ETHYL GLUCURONIDE (ETG): 162707 ng/mL — AB (ref <500)
Ethyl Sulfate (ETS): 22569 ng/mL — ABNORMAL HIGH (ref <100)
MARIJUANA METABOLITE: NEGATIVE ng/mL (ref <20)
MDMA: NEGATIVE ng/mL (ref <500)
Opiates: NEGATIVE ng/mL (ref <100)
Oxidant: NEGATIVE ug/mL (ref <200)
Oxycodone: NEGATIVE ng/mL (ref <100)
PH: 6.19 (ref 4.5–9.0)

## 2018-11-16 ENCOUNTER — Other Ambulatory Visit: Payer: Self-pay | Admitting: Medical

## 2018-11-28 ENCOUNTER — Telehealth: Payer: Self-pay | Admitting: Medical

## 2018-11-28 ENCOUNTER — Encounter: Payer: Self-pay | Admitting: Medical

## 2018-11-28 MED ORDER — ALPRAZOLAM 0.25 MG PO TABS: ORAL_TABLET | ORAL | 0 refills | Status: DC

## 2018-11-28 NOTE — Telephone Encounter (Signed)
Xanax refill sent to pt pharamcy.

## 2018-12-16 ENCOUNTER — Other Ambulatory Visit: Payer: Self-pay | Admitting: Medical

## 2019-01-11 ENCOUNTER — Other Ambulatory Visit: Payer: Self-pay | Admitting: Medical

## 2019-04-05 ENCOUNTER — Other Ambulatory Visit: Payer: Self-pay | Admitting: Medical

## 2019-06-06 ENCOUNTER — Other Ambulatory Visit: Payer: Self-pay | Admitting: Medical

## 2019-06-08 NOTE — Telephone Encounter (Signed)
Pt needs an appointment before more refills

## 2019-06-11 NOTE — Telephone Encounter (Signed)
LM for pt to call and schedule f/u appt before med refills

## 2019-06-12 NOTE — Telephone Encounter (Addendum)
Refill Request: Alprazolam   Last RX:11/28/18 Last OV:12/08/18 Next RP:ZPSU scheduled  UDS:11/07/18 CSC:11/07/18 CSR:06/12/2019.   Pt has not bee seen in office for months.  Last I saw her she was let go from her employment. She was working for [employer]. So would you ask pt to schedule follow up visit.

## 2019-09-10 ENCOUNTER — Ambulatory Visit (INDEPENDENT_AMBULATORY_CARE_PROVIDER_SITE_OTHER): Payer: BC Managed Care – PPO | Admitting: Medical

## 2019-09-10 ENCOUNTER — Telehealth: Payer: Self-pay | Admitting: Medical

## 2019-09-10 ENCOUNTER — Encounter: Payer: Self-pay | Admitting: Medical

## 2019-09-10 ENCOUNTER — Other Ambulatory Visit: Payer: Self-pay

## 2019-09-10 VITALS — BP 167/114 | HR 88 | Temp 97.8°F | Resp 16 | Ht 65.0 in | Wt 195.2 lb

## 2019-09-10 DIAGNOSIS — F329 Major depressive disorder, single episode, unspecified: Secondary | ICD-10-CM | POA: Diagnosis not present

## 2019-09-10 DIAGNOSIS — F419 Anxiety disorder, unspecified: Secondary | ICD-10-CM | POA: Diagnosis not present

## 2019-09-10 DIAGNOSIS — I10 Essential (primary) hypertension: Secondary | ICD-10-CM

## 2019-09-10 DIAGNOSIS — F32A Depression, unspecified: Secondary | ICD-10-CM

## 2019-09-10 LAB — COMPREHENSIVE METABOLIC PANEL
ALT: 11 U/L (ref 0–35)
AST: 12 U/L (ref 0–37)
Albumin: 4.1 g/dL (ref 3.5–5.2)
Alkaline Phosphatase: 79 U/L (ref 39–117)
BUN: 13 mg/dL (ref 6–23)
CO2: 26 mEq/L (ref 19–32)
Calcium: 9.2 mg/dL (ref 8.4–10.5)
Chloride: 101 mEq/L (ref 96–112)
Creatinine, Ser: 1.06 mg/dL (ref 0.40–1.20)
GFR: 67.1 mL/min (ref >60.00)
Glucose, Bld: 89 mg/dL (ref 70–99)
Potassium: 3.9 mEq/L (ref 3.5–5.1)
Sodium: 136 mEq/L (ref 135–145)
Total Bilirubin: 0.4 mg/dL (ref 0.2–1.2)
Total Protein: 6.4 g/dL (ref 6.0–8.3)

## 2019-09-10 MED ORDER — ALPRAZOLAM 0.25 MG PO TABS: ORAL_TABLET | ORAL | 1 refills | Status: DC

## 2019-09-10 MED ORDER — AMLODIPINE-OLMESARTAN 10-40 MG PO TABS: 1.0000 | ORAL_TABLET | Freq: Every day | ORAL | 5 refills | Status: DC

## 2019-09-10 MED ORDER — HYDROCHLOROTHIAZIDE 12.5 MG PO CAPS: ORAL_CAPSULE | ORAL | 3 refills | Status: DC

## 2019-09-10 MED ORDER — VENLAFAXINE HCL ER 75 MG PO CP24: ORAL_CAPSULE | ORAL | 5 refills | Status: DC

## 2019-09-10 NOTE — Progress Notes (Signed)
Subjective:    Patient ID: Katie Merritt, female    DOB: 09/02/1971, 48 y.o.   MRN: [id number]  HPI  Pt in for follow up.  She states she was doing well with anxiety until about October when her unemployment was running out. Pt got job at [employer]. She thinks some seasonal depression. She had lost job around onset of pandemic working for [employer]. Pt also is attending college. Had been on effexor in the past. Pt states had not taken it in a while. Then started.   Pt gerd recently flared.  Pt has been using omeprazole otc and it does help a lot when she uses. Gerd x one year.   Pt is getting a lot of clear mucus at times when clears throat. But also dry cough.  Pt has been on azor for her bp. She has not checked her bp in the longest time. Also was on hctz. On review she is not sure she is on azor and it appears she would have run out of that in the summer.    Review of Systems  Constitutional: Negative for chills, fatigue and fever.  Respiratory: Negative for cough, chest tightness, shortness of breath and wheezing.   Cardiovascular: Negative for chest pain and palpitations.  Gastrointestinal: Negative for abdominal pain.  Hematological: Negative for adenopathy. Does not bruise/bleed easily.  Psychiatric/Behavioral: Negative for behavioral problems, decreased concentration, dysphoric mood, self-injury and suicidal ideas. The patient is nervous/anxious.     Past Medical History:  Diagnosis Date  . Anxiety   . COPD (chronic obstructive pulmonary disease) (HCC)   . Hyperlipidemia   . Hypertension      Social History   Socioeconomic History  . Marital status: Single    Spouse name: Not on file  . Number of children: Not on file  . Years of education: Not on file  . Highest education level: Not on file  Occupational History  . Not on file  Social Needs  . Financial resource strain: Not on file  . Food insecurity    Worry: Not on file    Inability: Not on file   . Transportation needs    Medical: Not on file    Non-medical: Not on file  Tobacco Use  . Smoking status: Former Smoker    Quit date: 11/25/2013    Years since quitting: 5.7  . Smokeless tobacco: Never Used  Substance and Sexual Activity  . Alcohol use: Yes    Alcohol/week: 0.0 standard drinks    Comment: Light Smoker Only  . Drug use: Not on file  . Sexual activity: Not on file  Lifestyle  . Physical activity    Days per week: Not on file    Minutes per session: Not on file  . Stress: Not on file  Relationships  . Social Musician on phone: Not on file    Gets together: Not on file    Attends religious service: Not on file    Active member of club or organization: Not on file    Attends meetings of clubs or organizations: Not on file    Relationship status: Not on file  . Intimate partner violence    Fear of current or ex partner: Not on file    Emotionally abused: Not on file    Physically abused: Not on file    Forced sexual activity: Not on file  Other Topics Concern  . Not on file  Social History  Narrative  . Not on file    Past Surgical History:  Procedure Laterality Date  . Leap    . TUBAL LIGATION      Family History  Problem Relation Age of Onset  . Hyperlipidemia Mother   . Hypertension Mother     No Known Allergies  Current Outpatient Medications on File Prior to Visit  Medication Sig Dispense Refill  . albuterol (PROVENTIL HFA;VENTOLIN HFA) 108 (90 Base) MCG/ACT inhaler Inhale 2 puffs into the lungs every 6 (six) hours as needed for wheezing or shortness of breath. 1 Inhaler 2  . ALPRAZolam (XANAX) 0.25 MG tablet TAKE 1 TABLET BY MOUTH EVERY DAY AS NEEDED FOR ANXIETY 15 tablet 0  . amLODipine-olmesartan (AZOR) 10-40 MG tablet Take 1 tablet by mouth daily. 30 tablet 5  . atorvastatin (LIPITOR) 40 MG tablet Take 1 tablet (40 mg total) by mouth daily. 30 tablet 2  . atorvastatin (LIPITOR) 80 MG tablet Take 80 mg by mouth at bedtime.    .  fluconazole (DIFLUCAN) 150 MG tablet 1 tab po for one day. Repeat in 5-7 days if necessar 2 tablet 0  . hydrochlorothiazide (MICROZIDE) 12.5 MG capsule TAKE 1 CAPSULE BY MOUTH EVERY DAY 90 capsule 1  . levonorgestrel (MIRENA, 52 MG,) 20 MCG/24HR IUD Mirena 20 mcg/24 hours (5 yrs) 52 mg intrauterine device  Take 1 device by intrauterine route.    . Multiple Vitamins-Minerals (WOMENS MULTIVITAMIN PO) Take by mouth daily.    Marland Kitchen SAXENDA 18 MG/3ML SOPN INJECT 3 MG AS DIRECTED DAILY. 15 mL 2  . topiramate (TOPAMAX) 25 MG tablet 1 tab po  twice daily 60 tablet 3  . ULTICARE SHORT PEN NEEDLES 31G X 8 MM MISC USE AS DIRECTED TO INJECT SAXENDA 100 each 0  . venlafaxine XR (EFFEXOR-XR) 75 MG 24 hr capsule TAKE 1 CAPSULE (75 MG TOTAL) BY MOUTH DAILY WITH BREAKFAST. 30 capsule 1   No current facility-administered medications on file prior to visit.     BP (!) 167/114   Pulse 88   Temp 97.8 F (36.6 C) (Temporal)   Resp 16   Ht 5\' 5"  (1.651 m)   Wt 195 lb 3.2 oz (88.5 kg)   SpO2 100%   BMI 32.48 kg/m       Objective:   Physical Exam  General- No acute distress. Pleasant patient. Neck- Full range of motion, no jvd Lungs- Clear, even and unlabored. Heart- regular rate and rhythm. Neurologic- CNII- XII grossly intact. Abdomen- soft, non-tender, +bowel sound, no rebound or guarding.         Assessment & Plan:  For htn, please start back on azor and hctz combination. We need to recheck you bp in 7-10 days. Can do virtual visit if you have bp cuff at home. I any cardiac or neurologic signs or symptoms then recommend ED evaluation.  For depression and anxiety continue with effexor and xanax. Will need to get update conract and uds in January.  For gerd, contiue with omeprazole and healthy diet. Will refer you to GI since some intermittent severe symptoms at times despite tx.  Follow up 10 days or as needed  25 + minutes spent with pt. 50% of time spent counseling on plan going forward.  Please get cmp today.

## 2019-09-10 NOTE — Telephone Encounter (Signed)
Pt needs her phamacy changes to [facility] on [address]. Will you cancel [facility] today. I gave her print rx.

## 2019-09-10 NOTE — Patient Instructions (Addendum)
For htn, please start back on azor and hctz combination. We need to recheck you bp in 7-10 days. Can do virtual visit if you have bp cuff at home. I any cardiac or neurologic signs or symptoms then recommend ED evaluation.  For depression and anxiety continue with effexor and xanax. Will need to get update conract and uds in January.  For gerd, contiue with omeprazole and healthy diet. Will refer you to GI since some intermittent severe symptoms at times despite tx.  Follow up 10 days or as needed

## 2019-10-24 ENCOUNTER — Ambulatory Visit: Payer: BC Managed Care – PPO | Admitting: Medical

## 2019-10-30 ENCOUNTER — Encounter: Payer: Self-pay | Admitting: Medical

## 2019-10-31 ENCOUNTER — Other Ambulatory Visit: Payer: Self-pay

## 2019-10-31 MED ORDER — ALPRAZOLAM 0.25 MG PO TABS: ORAL_TABLET | ORAL | 0 refills | Status: DC

## 2019-10-31 NOTE — Telephone Encounter (Addendum)
Requesting:Alprazolam Contract:11/07/18 UDS: 11/07/18 Last OV:09/10/19 Next OV:11/02/19 Last Refill:09/10/19 #30 1rf Database: today.   Please advise    I am refillig for one month. She needs to get scheduled for uds and updated contract before next refill.  Does not need to see me but just needs those done  I accidentally printed the rx. Then sent electronically. Will you shred the print rx tomorrow am.

## 2019-11-02 ENCOUNTER — Ambulatory Visit (INDEPENDENT_AMBULATORY_CARE_PROVIDER_SITE_OTHER): Payer: BC Managed Care – PPO | Admitting: Medical

## 2019-11-02 ENCOUNTER — Other Ambulatory Visit: Payer: Self-pay

## 2019-11-02 VITALS — BP 132/95 | HR 82 | Ht 65.0 in | Wt 196.0 lb

## 2019-11-02 DIAGNOSIS — E785 Hyperlipidemia, unspecified: Secondary | ICD-10-CM | POA: Diagnosis not present

## 2019-11-02 DIAGNOSIS — I10 Essential (primary) hypertension: Secondary | ICD-10-CM | POA: Diagnosis not present

## 2019-11-02 NOTE — Patient Instructions (Signed)
Your blood pressure is good today on second check. Earlier not bad as well. Historically higher. I want you to continue current bp meds. Get future cmp and lipid panel next week. Also get nurse bp check that day. Bring in your machine. Not clear if accurate bp readings. If need to make adjustments at this time.  For high cholesterol get fasting lipid panel next week. Then decide if med needed for choleserol'  Follow up date to be determined after lab review and nurse bp check.

## 2019-11-02 NOTE — Progress Notes (Signed)
Subjective:    Patient ID: Katie Merritt, female    DOB: Feb 07, 1971, 49 y.o.   MRN: [id number]  HPI   Virtual Visit via Video Note  I connected with Katie Merritt on 11/02/19 at  1:40 PM EST by a video enabled telemedicine application and verified that I am speaking with the correct person using two identifiers.  Location: Patient: home Provider: office   I discussed the limitations of evaluation and management by telemedicine and the availability of in person appointments. The patient expressed understanding and agreed to proceed.  History of Present Illness:   Pt has htn. Pt bp on last visit ws 167/114. Today her blood pressure was 132/95 ealier today. She she states ususally her bp since last visit was 150/90 so she wanders why lower side today.    Pt bp when she checked again was 119/89 while we were on phone.   Pt is on amlodipine-olmasartan and hctz.  Pt has omron bp cuff     Observations/Objective: General-no acute distress, pleasant, oriented. Lungs- on inspection lungs appear unlabored. Neck- no tracheal deviation or jvd on inspection. Neuro- gross motor function appears intact.    Assessment and Plan: Your blood pressure is good today on second check. Earlier not bad as well. Historically higher. I want you to continue current bp meds. Get future cmp and lipid panel next week. Also get nurse bp check that day. Bring in your machine. Not clear if accurate bp readings. If need to make adjustments at this time.  For high cholesterol get fasting lipid panel next week. Then decide if med needed for choleserol'  Follow up date to be determined after lab review and nurse bp check.  Follow Up Instructions:    I discussed the assessment and treatment plan with the patient. The patient was provided an opportunity to ask questions and all were answered. The patient agreed with the plan and demonstrated an understanding of the instructions.   The patient was  advised to call back or seek an in-person evaluation if the symptoms worsen or if the condition fails to improve as anticipated.  I provided 20 minutes of non-face-to-face time during this encounter.   Esperanza Richters, PA-C    Review of Systems  Constitutional: Negative for chills, fatigue and fever.  Respiratory: Negative for chest tightness, shortness of breath and wheezing.   Cardiovascular: Negative for chest pain and palpitations.  Gastrointestinal: Negative for abdominal pain, blood in stool, constipation and nausea.  Musculoskeletal: Negative for back pain and myalgias.  Skin: Negative for rash.  Neurological:       Last couple of days on an off mild ha. She has hx of migraine. She took topmax and ha stopped.  Hematological: Negative for adenopathy. Does not bruise/bleed easily.  Psychiatric/Behavioral: Negative for behavioral problems, confusion and sleep disturbance. The patient is not nervous/anxious.        Objective:   Physical Exam        Assessment & Plan:

## 2019-11-13 DIAGNOSIS — N760 Acute vaginitis: Secondary | ICD-10-CM | POA: Diagnosis not present

## 2019-11-13 DIAGNOSIS — Z113 Encounter for screening for infections with a predominantly sexual mode of transmission: Secondary | ICD-10-CM | POA: Diagnosis not present

## 2019-11-13 DIAGNOSIS — N898 Other specified noninflammatory disorders of vagina: Secondary | ICD-10-CM | POA: Diagnosis not present

## 2019-11-21 ENCOUNTER — Encounter: Payer: Self-pay | Admitting: Medical

## 2019-11-23 DIAGNOSIS — I10 Essential (primary) hypertension: Secondary | ICD-10-CM | POA: Diagnosis not present

## 2019-11-23 DIAGNOSIS — K219 Gastro-esophageal reflux disease without esophagitis: Secondary | ICD-10-CM | POA: Diagnosis not present

## 2019-11-23 DIAGNOSIS — G4710 Hypersomnia, unspecified: Secondary | ICD-10-CM | POA: Diagnosis not present

## 2019-12-07 DIAGNOSIS — R0683 Snoring: Secondary | ICD-10-CM | POA: Diagnosis not present

## 2019-12-07 DIAGNOSIS — D485 Neoplasm of uncertain behavior of skin: Secondary | ICD-10-CM | POA: Diagnosis not present

## 2019-12-07 DIAGNOSIS — L728 Other follicular cysts of the skin and subcutaneous tissue: Secondary | ICD-10-CM | POA: Diagnosis not present

## 2019-12-07 DIAGNOSIS — D229 Melanocytic nevi, unspecified: Secondary | ICD-10-CM | POA: Diagnosis not present

## 2019-12-07 DIAGNOSIS — G4736 Sleep related hypoventilation in conditions classified elsewhere: Secondary | ICD-10-CM | POA: Diagnosis not present

## 2019-12-14 DIAGNOSIS — I10 Essential (primary) hypertension: Secondary | ICD-10-CM | POA: Diagnosis not present

## 2019-12-14 DIAGNOSIS — K219 Gastro-esophageal reflux disease without esophagitis: Secondary | ICD-10-CM | POA: Diagnosis not present

## 2019-12-14 DIAGNOSIS — E669 Obesity, unspecified: Secondary | ICD-10-CM | POA: Diagnosis not present

## 2019-12-14 DIAGNOSIS — R0683 Snoring: Secondary | ICD-10-CM | POA: Diagnosis not present

## 2019-12-26 ENCOUNTER — Ambulatory Visit (INDEPENDENT_AMBULATORY_CARE_PROVIDER_SITE_OTHER): Payer: BC Managed Care – PPO | Admitting: Medical

## 2019-12-26 ENCOUNTER — Other Ambulatory Visit: Payer: Self-pay

## 2019-12-26 VITALS — BP 140/80 | HR 91 | Temp 97.6°F | Resp 18 | Ht 65.0 in | Wt 196.0 lb

## 2019-12-26 DIAGNOSIS — E669 Obesity, unspecified: Secondary | ICD-10-CM

## 2019-12-26 DIAGNOSIS — I10 Essential (primary) hypertension: Secondary | ICD-10-CM

## 2019-12-26 DIAGNOSIS — F17200 Nicotine dependence, unspecified, uncomplicated: Secondary | ICD-10-CM

## 2019-12-26 NOTE — Progress Notes (Signed)
Subjective:    Patient ID: Katie Merritt, female    DOB: [birth year], 49 y.o.   MRN: [id number]  HPI  Pt in for evaluation.  Pt states she wants to work from home. She works for [employer]. 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 Pt states other persons working from home. Pt state on weekly basis get update on coworkers who test + for covid.  Pt wants to work from home. Pt risk score is 3. Risk of covid does impact her anxiety as well.   Pt better controlled recently.  Pt is still smoking. She does wheeze occasionally. Presents at times in past like reactive airways.  Pt bmi greater than 30.   Review of Systems  Constitutional: Negative for chills, fatigue and fever.  Respiratory: Negative for cough, chest tightness, shortness of breath and wheezing.   Cardiovascular: Negative for chest pain and palpitations.  Gastrointestinal: Negative for abdominal pain.  Musculoskeletal: Negative for back pain and neck pain.  Skin: Negative for rash.  Neurological: Negative for dizziness, tremors, speech difficulty and light-headedness.  Hematological: Negative for adenopathy. Does not bruise/bleed easily.  Psychiatric/Behavioral: Negative for behavioral problems and confusion.   Past Medical History:  Diagnosis Date  . Anxiety   . COPD (chronic obstructive pulmonary disease) (HCC)   . Hyperlipidemia   . Hypertension      Social History   Socioeconomic History  . Marital status: Single    Spouse name: Not on file  . Number of children: Not on file  . Years of education: Not on file  . Highest education level: Not on file  Occupational History  . Not on file  Tobacco Use  . Smoking status: Former Smoker    Quit date: 11/25/2013    Years since quitting: 6.0  . Smokeless tobacco: Never Used  Substance and Sexual Activity  . Alcohol use: Yes    Alcohol/week: 0.0 standard drinks    Comment: Light Smoker Only  . Drug use: Not on file  . Sexual activity:  Not on file  Other Topics Concern  . Not on file  Social History Narrative  . Not on file   Social Determinants of Health   Financial Resource Strain:   . Difficulty of Paying Living Expenses: Not on file  Food Insecurity:   . Worried About Programme researcher, broadcasting/film/video in the Last Year: Not on file  . Ran Out of Food in the Last Year: Not on file  Transportation Needs:   . Lack of Transportation (Medical): Not on file  . Lack of Transportation (Non-Medical): Not on file  Physical Activity:   . Days of Exercise per Week: Not on file  . Minutes of Exercise per Session: Not on file  Stress:   . Feeling of Stress : Not on file  Social Connections:   . Frequency of Communication with Friends and Family: Not on file  . Frequency of Social Gatherings with Friends and Family: Not on file  . Attends Religious Services: Not on file  . Active Member of Clubs or Organizations: Not on file  . Attends Banker Meetings: Not on file  . Marital Status: Not on file  Intimate Partner Violence:   . Fear of Current or Ex-Partner: Not on file  . Emotionally Abused: Not on file  . Physically Abused: Not on file  . Sexually Abused: Not on file    Past Surgical History:  Procedure Laterality Date  . Leap    .  TUBAL LIGATION      Family History  Problem Relation Age of Onset  . Hyperlipidemia Mother   . Hypertension Mother     No Known Allergies  Current Outpatient Medications on File Prior to Visit  Medication Sig Dispense Refill  . albuterol (PROVENTIL HFA;VENTOLIN HFA) 108 (90 Base) MCG/ACT inhaler Inhale 2 puffs into the lungs every 6 (six) hours as needed for wheezing or shortness of breath. 1 Inhaler 2  . ALPRAZolam (XANAX) 0.25 MG tablet TAKE 1 TABLET BY MOUTH EVERY DAY AS NEEDED FOR ANXIETY 30 tablet 0  . amLODipine-olmesartan (AZOR) 10-40 MG tablet Take 1 tablet by mouth daily. 30 tablet 5  . atorvastatin (LIPITOR) 40 MG tablet Take 1 tablet (40 mg total) by mouth daily. 30  tablet 2  . atorvastatin (LIPITOR) 80 MG tablet Take 80 mg by mouth at bedtime.    . fluconazole (DIFLUCAN) 150 MG tablet 1 tab po for one day. Repeat in 5-7 days if necessar 2 tablet 0  . hydrochlorothiazide (MICROZIDE) 12.5 MG capsule TAKE 1 CAPSULE BY MOUTH EVERY DAY 90 capsule 3  . levonorgestrel (MIRENA, 52 MG,) 20 MCG/24HR IUD Mirena 20 mcg/24 hours (5 yrs) 52 mg intrauterine device  Take 1 device by intrauterine route.    . Multiple Vitamins-Minerals (WOMENS MULTIVITAMIN PO) Take by mouth daily.    Marland Kitchen SAXENDA 18 MG/3ML SOPN INJECT 3 MG AS DIRECTED DAILY. 15 mL 2  . topiramate (TOPAMAX) 25 MG tablet 1 tab po  twice daily 60 tablet 3  . ULTICARE SHORT PEN NEEDLES 31G X 8 MM MISC USE AS DIRECTED TO INJECT SAXENDA 100 each 0  . venlafaxine XR (EFFEXOR-XR) 75 MG 24 hr capsule TAKE 1 CAPSULE (75 MG TOTAL) BY MOUTH DAILY WITH BREAKFAST. 30 capsule 5   No current facility-administered medications on file prior to visit.    BP 140/80 (BP Location: Left Arm, Patient Position: Sitting, Cuff Size: Normal)   Pulse 91   Temp 97.6 F (36.4 C) (Temporal)   Resp 18   Ht 5\' 5"  (1.651 m)   Wt 196 lb (88.9 kg)   SpO2 99%   BMI 32.62 kg/m       Objective:   Physical Exam  General Mental Status- Alert. General Appearance- Not in acute distress.   Skin General: Color- Normal Color. Moisture- Normal Moisture.  Neck Carotid Arteries- Normal color. Moisture- Normal Moisture. No carotid bruits. No JVD.  Chest and Lung Exam Auscultation: Breath Sounds:-Normal.  Cardiovascular Auscultation:Rythm- Regular. Murmurs & Other Heart Sounds:Auscultation of the heart reveals- No Murmurs.  Abdomen Inspection:-Inspeection Normal. Palpation/Percussion:Note:No mass. Palpation and Percussion of the abdomen reveal- Non Tender, Non Distended + BS, no rebound or guarding.    Neurologic Cranial Nerve exam:- CN III-XII intact(No nystagmus), symmetric smile. Strength:- 5/5 equal and symmetric strength  both upper and lower extremities.      Assessment & Plan:  Your bp is better controlled compared to prior. Continue current treatment regimen.  Your weight has been improving slowly. Keep diet and exercising.  Hx of intermittent rare reactive airway presentation. If any recurrrent wheezing let me know. Please stop smoking completely.  Letter printed asking for accomadation to work from home. Explained covid risk score.  Follow up date to be determined after you get future labs that I placed in January.  25 minutes spent with pt.

## 2019-12-26 NOTE — Patient Instructions (Addendum)
Your bp is better controlled compared to prior. Continue current treatment regimen.  Your weight has been improving slowly. Keep diet and exercising.  Hx of intermittent rare reactive airway presentation. If any recurrrent wheezing let me know. Please stop smoking completely.  Letter printed asking for accomadation to work from home. Explained covid risk score.  Follow up date to be determined after you get future labs that I placed in January.

## 2019-12-28 ENCOUNTER — Other Ambulatory Visit: Payer: BC Managed Care – PPO

## 2020-01-31 ENCOUNTER — Other Ambulatory Visit: Payer: Self-pay | Admitting: Medical

## 2020-01-31 NOTE — Telephone Encounter (Addendum)
Requesting:xanax Contract: 2.5.20 UDS:1.14.20 Last Visit:3.3.21 Next Visit:4.13.21 Last Refill:1.6.21  Please Advise Refilled med today. She needs to follow July or august for controlled med visit.

## 2020-02-05 ENCOUNTER — Other Ambulatory Visit: Payer: Self-pay

## 2020-02-05 ENCOUNTER — Ambulatory Visit (INDEPENDENT_AMBULATORY_CARE_PROVIDER_SITE_OTHER): Payer: BC Managed Care – PPO | Admitting: Medical

## 2020-02-05 ENCOUNTER — Encounter: Payer: Self-pay | Admitting: Medical

## 2020-02-05 VITALS — BP 140/90 | HR 86 | Temp 98.2°F | Resp 18 | Ht 65.0 in | Wt 190.0 lb

## 2020-02-05 DIAGNOSIS — E785 Hyperlipidemia, unspecified: Secondary | ICD-10-CM | POA: Diagnosis not present

## 2020-02-05 DIAGNOSIS — R519 Headache, unspecified: Secondary | ICD-10-CM

## 2020-02-05 DIAGNOSIS — F419 Anxiety disorder, unspecified: Secondary | ICD-10-CM | POA: Diagnosis not present

## 2020-02-05 DIAGNOSIS — I10 Essential (primary) hypertension: Secondary | ICD-10-CM

## 2020-02-05 LAB — LIPID PANEL
Cholesterol: 265 mg/dL — ABNORMAL HIGH (ref 0–200)
HDL: 44.8 mg/dL (ref >39.00)
LDL Cholesterol: 186 mg/dL — ABNORMAL HIGH (ref 0–99)
NonHDL: 220.02
Total CHOL/HDL Ratio: 6
Triglycerides: 172 mg/dL — ABNORMAL HIGH (ref 0.0–149.0)
VLDL: 34.4 mg/dL (ref 0.0–40.0)

## 2020-02-05 LAB — COMPREHENSIVE METABOLIC PANEL
ALT: 10 U/L (ref 0–35)
AST: 12 U/L (ref 0–37)
Albumin: 4.2 g/dL (ref 3.5–5.2)
Alkaline Phosphatase: 111 U/L (ref 39–117)
BUN: 14 mg/dL (ref 6–23)
CO2: 27 mEq/L (ref 19–32)
Calcium: 9.1 mg/dL (ref 8.4–10.5)
Chloride: 100 mEq/L (ref 96–112)
Creatinine, Ser: 1.01 mg/dL (ref 0.40–1.20)
GFR: 70.83 mL/min (ref >60.00)
Glucose, Bld: 86 mg/dL (ref 70–99)
Potassium: 3.7 mEq/L (ref 3.5–5.1)
Sodium: 135 mEq/L (ref 135–145)
Total Bilirubin: 0.4 mg/dL (ref 0.2–1.2)
Total Protein: 6.6 g/dL (ref 6.0–8.3)

## 2020-02-05 MED ORDER — TOPIRAMATE 25 MG PO TABS: ORAL_TABLET | ORAL | 3 refills | Status: DC

## 2020-02-05 MED ORDER — SUMATRIPTAN SUCCINATE 50 MG PO TABS: 50.0000 mg | ORAL_TABLET | ORAL | 2 refills | Status: DC | PRN

## 2020-02-05 NOTE — Addendum Note (Signed)
Addended by: Caffie Pinto on: 02/05/2020 10:57 AM   Modules accepted: Orders

## 2020-02-05 NOTE — Patient Instructions (Addendum)
Your bp is borderline high today but not on your meds so please take when you get home.  For high cholesterol get cmp and lipid panel.  For anxiety refilled your xanax.  For migraine ha did refill your topomax rx for prevention and refilled imitrex as well. When on ha much improved.   Follow up 4-5 months or as needed

## 2020-02-05 NOTE — Progress Notes (Addendum)
Subjective:    Patient ID: Katie Merritt, female    DOB: 1971-06-07, 49 y.o.   MRN: [id number]  HPI  Pt in for follow up. I did refill her xanax and did want her to come in for follow up. She is up to date on uds and contract. Med is working when uses.   Pt bp is high. No gross motor or sensory function deficits. She did not take bp med today.  Pt has future lipid panel and cmp in epic. She is fasting today will get.  Pt request refill of her topamax. She states migraine ha were less frequent and had to use imitrex less frequent. Last ha she had was last month. Was light sensitive and nausea. Last ha not severe. Went away after 2-3 alleve.   Review of Systems  Constitutional: Negative for chills, fatigue and fever.  HENT: Negative for congestion, drooling and ear pain.   Respiratory: Negative for cough, chest tightness, shortness of breath and wheezing.   Cardiovascular: Negative for chest pain and palpitations.  Gastrointestinal: Negative for abdominal pain, constipation, diarrhea, nausea and vomiting.  Musculoskeletal: Negative for arthralgias, back pain, myalgias and neck stiffness.  Skin: Negative for rash.  Neurological: Negative for dizziness, speech difficulty, weakness, numbness and headaches.  Hematological: Negative for adenopathy. Does not bruise/bleed easily.  Psychiatric/Behavioral: Negative for behavioral problems, confusion, sleep disturbance and suicidal ideas. The patient is nervous/anxious.        Objective:   Physical Exam   General Mental Status- Alert. General Appearance- Not in acute distress.   Skin General: Color- Normal Color. Moisture- Normal Moisture.  Neck Carotid Arteries- Normal color. Moisture- Normal Moisture. No carotid bruits. No JVD.  Chest and Lung Exam Auscultation: Breath Sounds:-Normal.  Cardiovascular Auscultation:Rythm- Regular. Murmurs & Other Heart Sounds:Auscultation of the heart reveals- No  Murmurs.  Abdomen Inspection:-Inspeection Normal. Palpation/Percussion:Note:No mass. Palpation and Percussion of the abdomen reveal- Non Tender, Non Distended + BS, no rebound or guarding.    Neurologic Cranial Nerve exam:- CN III-XII intact(No nystagmus), symmetric smile. Strength:- 5/5 equal and symmetric strength both upper and lower extremities.     Assessment & Plan:  Your bp is borderline high today but not on your meds so please take when you get home.  For high cholesterol get cmp and lipid panel.  For anxiety refilled your xanax.  For migraine ha did refill your topomax rx for prevention and refilled imitrex as well. When on ha much improved.  Follow up 4-5 months or as needed  Time spent with patient today was 25  minutes which consisted of chart review, discussing diagnosis, work up,treatment and documentation.  Sending note to supervising DO to review since refilling pt xanax today. 07/10/20.  Esperanza Richters, PA-C

## 2020-02-07 ENCOUNTER — Telehealth: Payer: Self-pay | Admitting: Medical

## 2020-02-07 MED ORDER — ATORVASTATIN CALCIUM 40 MG PO TABS: 40.0000 mg | ORAL_TABLET | Freq: Every day | ORAL | 3 refills | Status: DC

## 2020-02-07 NOTE — Telephone Encounter (Signed)
Rx atorvastatin sent to pt pharmacy. 

## 2020-02-08 MED FILL — ATORVASTATIN 40 MG TABLET: 40 | 90 days supply | Qty: 90 | Fill #0

## 2020-02-20 DIAGNOSIS — Z113 Encounter for screening for infections with a predominantly sexual mode of transmission: Secondary | ICD-10-CM | POA: Diagnosis not present

## 2020-02-20 DIAGNOSIS — Z01419 Encounter for gynecological examination (general) (routine) without abnormal findings: Secondary | ICD-10-CM | POA: Diagnosis not present

## 2020-02-20 DIAGNOSIS — E559 Vitamin D deficiency, unspecified: Secondary | ICD-10-CM | POA: Diagnosis not present

## 2020-02-20 DIAGNOSIS — N898 Other specified noninflammatory disorders of vagina: Secondary | ICD-10-CM | POA: Diagnosis not present

## 2020-02-20 DIAGNOSIS — Z124 Encounter for screening for malignant neoplasm of cervix: Secondary | ICD-10-CM | POA: Diagnosis not present

## 2020-04-04 ENCOUNTER — Telehealth: Payer: Self-pay

## 2020-04-04 ENCOUNTER — Encounter: Payer: Self-pay | Admitting: Gastroenterology

## 2020-04-18 DIAGNOSIS — N6310 Unspecified lump in the right breast, unspecified quadrant: Secondary | ICD-10-CM | POA: Diagnosis not present

## 2020-04-25 DIAGNOSIS — L723 Sebaceous cyst: Secondary | ICD-10-CM | POA: Diagnosis not present

## 2020-04-25 DIAGNOSIS — L720 Epidermal cyst: Secondary | ICD-10-CM | POA: Diagnosis not present

## 2020-05-22 ENCOUNTER — Telehealth: Payer: Self-pay

## 2020-05-22 NOTE — Telephone Encounter (Signed)
Left message for patient to call back to reschedule her pre visit by 5 pm today;

## 2020-05-27 ENCOUNTER — Encounter: Payer: Self-pay | Admitting: Gastroenterology

## 2020-05-28 NOTE — Telephone Encounter (Signed)
Patient has been rescheduled for PV room 50 on 06/06/2020;

## 2020-06-05 ENCOUNTER — Encounter: Payer: BC Managed Care – PPO | Admitting: Gastroenterology

## 2020-06-06 ENCOUNTER — Other Ambulatory Visit: Payer: Self-pay

## 2020-06-06 ENCOUNTER — Encounter: Payer: Self-pay | Admitting: Gastroenterology

## 2020-06-06 ENCOUNTER — Ambulatory Visit (AMBULATORY_SURGERY_CENTER): Payer: Self-pay | Admitting: *Deleted

## 2020-06-06 VITALS — Ht 65.0 in | Wt 190.0 lb

## 2020-06-06 DIAGNOSIS — Z1211 Encounter for screening for malignant neoplasm of colon: Secondary | ICD-10-CM

## 2020-06-06 MED ORDER — NA SULFATE-K SULFATE-MG SULF 17.5-3.13-1.6 GM/177ML PO SOLN: ORAL | 0 refills | Status: DC

## 2020-06-06 NOTE — Progress Notes (Signed)
Patient is here in-person for PV. Patient denies any allergies to eggs or soy. Patient denies any problems with anesthesia/sedation. Patient denies any oxygen use at home. Patient denies taking any diet/weight loss medications or blood thinners. Patient is not being treated for MRSA or C-diff. Patient is aware of our care-partner policy and PNPYY-51 safety protocol. EMMI education assisgned to the patient for the procedure, this was explained and instructions given to patient.   COVID-19 vaccines completed  02/17/20. Prep Prescription coupon given to the patient.

## 2020-06-10 ENCOUNTER — Other Ambulatory Visit: Payer: Self-pay | Admitting: Gastroenterology

## 2020-06-10 DIAGNOSIS — Z1211 Encounter for screening for malignant neoplasm of colon: Secondary | ICD-10-CM

## 2020-06-13 ENCOUNTER — Encounter: Payer: BC Managed Care – PPO | Admitting: Gastroenterology

## 2020-06-18 ENCOUNTER — Other Ambulatory Visit: Payer: Self-pay | Admitting: Medical

## 2020-06-20 ENCOUNTER — Other Ambulatory Visit: Payer: Self-pay

## 2020-06-20 ENCOUNTER — Encounter: Payer: Self-pay | Admitting: Medical

## 2020-06-20 ENCOUNTER — Ambulatory Visit (INDEPENDENT_AMBULATORY_CARE_PROVIDER_SITE_OTHER): Payer: BC Managed Care – PPO | Admitting: Medical

## 2020-06-20 VITALS — BP 113/79 | HR 93 | Temp 98.1°F | Resp 16 | Ht 65.0 in | Wt 189.4 lb

## 2020-06-20 DIAGNOSIS — R110 Nausea: Secondary | ICD-10-CM | POA: Diagnosis not present

## 2020-06-20 DIAGNOSIS — K219 Gastro-esophageal reflux disease without esophagitis: Secondary | ICD-10-CM

## 2020-06-20 MED ORDER — OMEPRAZOLE 40 MG PO CPDR: 40.0000 mg | DELAYED_RELEASE_CAPSULE | Freq: Every day | ORAL | 0 refills | Status: DC

## 2020-06-20 MED ORDER — ONDANSETRON 4 MG PO TBDP: 4.0000 mg | ORAL_TABLET | Freq: Three times a day (TID) | ORAL | 0 refills | Status: DC | PRN

## 2020-06-20 NOTE — Progress Notes (Signed)
Subjective:    Patient ID: Katie Merritt, female    DOB: 1971/03/16, 49 y.o.   MRN: [id number]  HPI  Pt in with nausea for one month all the time. Pt has tubes tied and on mirena. Pt has heavy cycles so on to control cycles. Pt has hx of heart burn in the past. Pt pepcid or alka seltzer daily.  Pt will vomit sometimes. She will gag and sometimes bring up little bit of watery vomitus. Pt has sensitive stomach as child and would very easily get queezy and vomit.  Pt has no pain in her lower quadrant/adnexal areas.  Pt states appetite is decreased. Admits to occasional greasy and fried foods.  Pt does drink alcohol on weekends. Sometimes up to bottle of wine on weekend or two mix drinks.  Review of Systems  Constitutional: Negative for chills and fever.  Gastrointestinal: Positive for nausea. Negative for abdominal distention, abdominal pain, blood in stool, constipation and vomiting.       2 months ago was having severe reflux.  Genitourinary: Negative for difficulty urinating, flank pain, frequency, genital sores, hematuria, urgency and vaginal discharge.    Past Medical History:  Diagnosis Date   Anxiety    COPD (chronic obstructive pulmonary disease) (HCC)    Hyperlipidemia    Hypertension      Social History   Socioeconomic History   Marital status: Single    Spouse name: Not on file   Number of children: Not on file   Years of education: Not on file   Highest education level: Not on file  Occupational History   Not on file  Tobacco Use   Smoking status: Former Smoker    Types: Cigarettes    Quit date: 11/25/2013    Years since quitting: 6.5   Smokeless tobacco: Never Used  Vaping Use   Vaping Use: Never used  Substance and Sexual Activity   Alcohol use: Yes    Alcohol/week: 0.0 standard drinks    Comment: occ   Drug use: Not Currently   Sexual activity: Not on file  Other Topics Concern   Not on file  Social History Narrative   Not on file     Social Determinants of Health   Financial Resource Strain:    Difficulty of Paying Living Expenses: Not on file  Food Insecurity:    Worried About Running Out of Food in the Last Year: Not on file   Ran Out of Food in the Last Year: Not on file  Transportation Needs:    Lack of Transportation (Medical): Not on file   Lack of Transportation (Non-Medical): Not on file  Physical Activity:    Days of Exercise per Week: Not on file   Minutes of Exercise per Session: Not on file  Stress:    Feeling of Stress : Not on file  Social Connections:    Frequency of Communication with Friends and Family: Not on file   Frequency of Social Gatherings with Friends and Family: Not on file   Attends Religious Services: Not on file   Active Member of Clubs or Organizations: Not on file   Attends Banker Meetings: Not on file   Marital Status: Not on file  Intimate Partner Violence:    Fear of Current or Ex-Partner: Not on file   Emotionally Abused: Not on file   Physically Abused: Not on file   Sexually Abused: Not on file    Past Surgical History:  Procedure Laterality  Date   Leap     TUBAL LIGATION      Family History  Problem Relation Age of Onset   Hyperlipidemia Mother    Hypertension Mother    Colon cancer Neg Hx    Colon polyps Neg Hx    Esophageal cancer Neg Hx    Rectal cancer Neg Hx    Stomach cancer Neg Hx     No Known Allergies  Current Outpatient Medications on File Prior to Visit  Medication Sig Dispense Refill   ALPRAZolam (XANAX) 0.25 MG tablet TAKE 1 TABLET BY MOUTH EVERY DAY AS NEEDED FOR ANXIETY. 30 tablet 3   amLODipine-olmesartan (AZOR) 10-40 MG tablet Take 1 tablet by mouth daily. 30 tablet 0   hydrochlorothiazide (MICROZIDE) 12.5 MG capsule TAKE 1 CAPSULE BY MOUTH EVERY DAY 90 capsule 3   levonorgestrel (MIRENA, 52 MG,) 20 MCG/24HR IUD Mirena 20 mcg/24 hours (5 yrs) 52 mg intrauterine device  Take 1 device by  intrauterine route.     topiramate (TOPAMAX) 25 MG tablet 1 tab po  twice daily 60 tablet 3   venlafaxine XR (EFFEXOR-XR) 75 MG 24 hr capsule TAKE 1 CAPSULE (75 MG TOTAL) BY MOUTH DAILY WITH BREAKFAST. 30 capsule 5   atorvastatin (LIPITOR) 40 MG tablet Take 1 tablet (40 mg total) by mouth daily. (Patient not taking: Reported on 06/06/2020) 90 tablet 3   Na Sulfate-K Sulfate-Mg Sulf 17.5-3.13-1.6 GM/177ML SOLN Suprep (no substitutions)-TAKE AS DIRECTED. Apply Coupon=BIN: [id number] PCN: CN GROUP: [id number] ID: [id number] (Patient not taking: Reported on 06/20/2020) 354 mL 0   SUMAtriptan (IMITREX) 50 MG tablet Take 1 tablet (50 mg total) by mouth every 2 (two) hours as needed for migraine. May repeat in 2 hours if headache persists or recurs. (Patient not taking: Reported on 06/06/2020) 10 tablet 2   No current facility-administered medications on file prior to visit.    BP 113/79 (BP Location: Left Arm, Patient Position: Sitting, Cuff Size: Normal)    Pulse 93    Temp 98.1 F (36.7 C) (Oral)    Resp 16    Ht 5\' 5"  (1.651 m)    Wt 189 lb 6 oz (85.9 kg)    SpO2 100%    BMI 31.51 kg/m       Objective:   Physical Exam  General- No acute distress. Pleasant patient. Neck- Full range of motion, no jvd Lungs- Clear, even and unlabored. Heart- regular rate and rhythm. Neurologic- CNII- XII grossly intact. Abdomen- soft, nt, nd, +bs, no rebound or guarding and no organomegaly.  Back- no cva tendernss.      Assessment & Plan:  You has a history of moderate to severe reflux couple months ago.  Last month constant nausea with mild vomiting episodes.  Recommend bland diet guidelines as discussed.  Also recommend cutting back on alcohol use.  We will get CMP, CBC lipase and H. pylori breath test.  Rx for omeprazole proton pump inhibitor sent to pharmacy.  Also Rx of Zofran for nausea and vomiting.  Year history of tubal ligation, use Mirena and age of 56.  Based on these factors did not get a  pregnancy test.  We discussed this today but if you do get any lower quadrant/adnexal region pain then I want you to get over-the-counter pregnancy test.  Let me know the result.  Follow-up in 7 days or as needed.  Esperanza Richters, PA-C    Time spent with patient today was 30  minutes which consisted of chart review,  discussing diagnosis, work up, treatment and documentation.

## 2020-06-20 NOTE — Patient Instructions (Signed)
You has a history of moderate to severe reflux couple months ago.  Last month constant nausea with mild vomiting episodes.  Recommend bland diet guidelines as discussed.  Also recommend cutting back on alcohol use.  We will get CMP, CBC lipase and H. pylori breath test.  Rx for omeprazole proton pump inhibitor sent to pharmacy.  Also Rx of Zofran for nausea and vomiting.  Year history of tubal ligation, use Mirena and age of 15.  Based on these factors did not get a pregnancy test.  We discussed this today but if you do get any lower quadrant/adnexal region pain then I want you to get over-the-counter pregnancy test.  Let me know the result.  Follow-up in 7 days or as needed.

## 2020-06-20 NOTE — Progress Notes (Signed)
Pre visit review using our clinic review tool, if applicable. No additional management support is needed unless otherwise documented below in the visit note. 

## 2020-06-21 LAB — COMPREHENSIVE METABOLIC PANEL
AG Ratio: 2.2 (calc) (ref 1.0–2.5)
ALT: 10 U/L (ref 6–29)
AST: 13 U/L (ref 10–35)
Albumin: 4.3 g/dL (ref 3.6–5.1)
Alkaline phosphatase (APISO): 95 U/L (ref 31–125)
BUN/Creatinine Ratio: 12 (calc) (ref 6–22)
BUN: 13 mg/dL (ref 7–25)
CO2: 29 mmol/L (ref 20–32)
Calcium: 9.1 mg/dL (ref 8.6–10.2)
Chloride: 102 mmol/L (ref 98–110)
Creat: 1.11 mg/dL — ABNORMAL HIGH (ref 0.50–1.10)
Globulin: 2 g/dL (calc) (ref 1.9–3.7)
Glucose, Bld: 107 mg/dL — ABNORMAL HIGH (ref 65–99)
Potassium: 4 mmol/L (ref 3.5–5.3)
Sodium: 137 mmol/L (ref 135–146)
Total Bilirubin: 0.4 mg/dL (ref 0.2–1.2)
Total Protein: 6.3 g/dL (ref 6.1–8.1)

## 2020-06-21 LAB — CBC WITH DIFFERENTIAL/PLATELET
Absolute Monocytes: 711 cells/uL (ref 200–950)
Basophils Absolute: 62 cells/uL (ref 0–200)
Basophils Relative: 0.9 %
Eosinophils Absolute: 83 cells/uL (ref 15–500)
Eosinophils Relative: 1.2 %
HCT: 42.1 % (ref 35.0–45.0)
Hemoglobin: 14.1 g/dL (ref 11.7–15.5)
Lymphs Abs: 1946 cells/uL (ref 850–3900)
MCH: 28.4 pg (ref 27.0–33.0)
MCHC: 33.5 g/dL (ref 32.0–36.0)
MCV: 84.9 fL (ref 80.0–100.0)
MPV: 11.5 fL (ref 7.5–12.5)
Monocytes Relative: 10.3 %
Neutro Abs: 4099 cells/uL (ref 1500–7800)
Neutrophils Relative %: 59.4 %
Platelets: 374 10*3/uL (ref 140–400)
RBC: 4.96 10*6/uL (ref 3.80–5.10)
RDW: 13.8 % (ref 11.0–15.0)
Total Lymphocyte: 28.2 %
WBC: 6.9 10*3/uL (ref 3.8–10.8)

## 2020-06-21 LAB — LIPASE: Lipase: 57 U/L (ref 7–60)

## 2020-06-23 LAB — H. PYLORI BREATH TEST: H. pylori Breath Test: NOT DETECTED

## 2020-06-27 ENCOUNTER — Ambulatory Visit (AMBULATORY_SURGERY_CENTER): Payer: BC Managed Care – PPO | Admitting: Gastroenterology

## 2020-06-27 ENCOUNTER — Encounter: Payer: BC Managed Care – PPO | Admitting: Gastroenterology

## 2020-06-27 ENCOUNTER — Other Ambulatory Visit: Payer: Self-pay

## 2020-06-27 ENCOUNTER — Other Ambulatory Visit: Payer: Self-pay | Admitting: Gastroenterology

## 2020-06-27 ENCOUNTER — Encounter: Payer: Self-pay | Admitting: Gastroenterology

## 2020-06-27 VITALS — BP 134/86 | HR 70 | Temp 97.3°F | Resp 20 | Ht 65.0 in | Wt 190.0 lb

## 2020-06-27 DIAGNOSIS — Z20822 Contact with and (suspected) exposure to covid-19: Secondary | ICD-10-CM | POA: Diagnosis not present

## 2020-06-27 DIAGNOSIS — Z1211 Encounter for screening for malignant neoplasm of colon: Secondary | ICD-10-CM

## 2020-06-27 DIAGNOSIS — D125 Benign neoplasm of sigmoid colon: Secondary | ICD-10-CM

## 2020-06-27 MED ORDER — SODIUM CHLORIDE 0.9 % IV SOLN: 500.0000 mL | Freq: Once | INTRAVENOUS | Status: DC

## 2020-06-27 NOTE — Patient Instructions (Signed)
Handout provided on polyps.   YOU HAD AN ENDOSCOPIC PROCEDURE TODAY AT THE  ENDOSCOPY CENTER:   Refer to the procedure report that was given to you for any specific questions about what was found during the examination.  If the procedure report does not answer your questions, please call your gastroenterologist to clarify.  If you requested that your care partner not be given the details of your procedure findings, then the procedure report has been included in a sealed envelope for you to review at your convenience later.  YOU SHOULD EXPECT: Some feelings of bloating in the abdomen. Passage of more gas than usual.  Walking can help get rid of the air that was put into your GI tract during the procedure and reduce the bloating. If you had a lower endoscopy (such as a colonoscopy or flexible sigmoidoscopy) you may notice spotting of blood in your stool or on the toilet paper. If you underwent a bowel prep for your procedure, you may not have a normal bowel movement for a few days.  Please Note:  You might notice some irritation and congestion in your nose or some drainage.  This is from the oxygen used during your procedure.  There is no need for concern and it should clear up in a day or so.  SYMPTOMS TO REPORT IMMEDIATELY:  Following lower endoscopy (colonoscopy or flexible sigmoidoscopy):  Excessive amounts of blood in the stool  Significant tenderness or worsening of abdominal pains  Swelling of the abdomen that is new, acute  Fever of 100F or higher  For urgent or emergent issues, a gastroenterologist can be reached at any hour by calling [phone]. Do not use MyChart messaging for urgent concerns.    DIET:  We do recommend a small meal at first, but then you may proceed to your regular diet.  Drink plenty of fluids but you should avoid alcoholic beverages for 24 hours.  ACTIVITY:  You should plan to take it easy for the rest of today and you should NOT DRIVE or use heavy  machinery until tomorrow (because of the sedation medicines used during the test).    FOLLOW UP: Our staff will call the number listed on your records 48-72 hours following your procedure to check on you and address any questions or concerns that you may have regarding the information given to you following your procedure. If we do not reach you, we will leave a message.  We will attempt to reach you two times.  During this call, we will ask if you have developed any symptoms of COVID 19. If you develop any symptoms (ie: fever, flu-like symptoms, shortness of breath, cough etc.) before then, please call [phone].  If you test positive for Covid 19 in the 2 weeks post procedure, please call and report this information to us.    If any biopsies were taken you will be contacted by phone or by letter within the next 1-3 weeks.  Please call us at [phone] if you have not heard about the biopsies in 3 weeks.    SIGNATURES/CONFIDENTIALITY: You and/or your care partner have signed paperwork which will be entered into your electronic medical record.  These signatures attest to the fact that that the information above on your After Visit Summary has been reviewed and is understood.  Full responsibility of the confidentiality of this discharge information lies with you and/or your care-partner.  

## 2020-06-27 NOTE — Progress Notes (Signed)
Called to room to assist during endoscopic procedure.  Patient ID and intended procedure confirmed with present staff. Received instructions for my participation in the procedure from the performing physician.  

## 2020-06-27 NOTE — Progress Notes (Signed)
Pt's states no medical or surgical changes since previsit or office visit.  Vitals- Courtney 

## 2020-06-27 NOTE — Op Note (Signed)
[facility] Patient Name: Katie Merritt Procedure Date: 06/27/2020 12:13 PM MRN: [id number] Endoscopist: Doristine Locks , MD Age: 49 Referring MD:  Date of Birth: 01/22/1971 Gender: Female Account #: [id number] Procedure:                Colonoscopy Indications:              Screening for colorectal malignant neoplasm, This                            is the patient's first colonoscopy Medicines:                Monitored Anesthesia Care Procedure:                Pre-Anesthesia Assessment:                           - Prior to the procedure, a History and Physical                            was performed, and patient medications and                            allergies were reviewed. The patient's tolerance of                            previous anesthesia was also reviewed. The risks                            and benefits of the procedure and the sedation                            options and risks were discussed with the patient.                            All questions were answered, and informed consent                            was obtained. Prior Anticoagulants: The patient has                            taken no previous anticoagulant or antiplatelet                            agents. ASA Grade Assessment: II - A patient with                            mild systemic disease. After reviewing the risks                            and benefits, the patient was deemed in                            satisfactory condition to undergo the procedure.  After obtaining informed consent, the colonoscope                            was passed under direct vision. Throughout the                            procedure, the patient's blood pressure, pulse, and                            oxygen saturations were monitored continuously. The                            Colonoscope was introduced through the anus and                            advanced to the the cecum,  identified by                            appendiceal orifice and ileocecal valve. The                            colonoscopy was performed without difficulty. The                            patient tolerated the procedure well. The quality                            of the bowel preparation was excellent. The                            ileocecal valve, appendiceal orifice, and rectum                            were photographed. Scope In: 12:24:35 PM Scope Out: 12:41:04 PM Scope Withdrawal Time: 0 hours 12 minutes 3 seconds  Total Procedure Duration: 0 hours 16 minutes 29 seconds  Findings:                 The perianal and digital rectal examinations were                            normal.                           A 3 mm polyp was found in the sigmoid colon. The                            polyp was sessile. The polyp was removed with a                            cold snare. Resection and retrieval were complete.                            Estimated blood loss was minimal.  The exam was otherwise normal throughout the                            remainder of the colon.                           The retroflexed view of the distal rectum and anal                            verge was normal and showed no anal or rectal                            abnormalities. Complications:            No immediate complications. Estimated Blood Loss:     Estimated blood loss was minimal. Impression:               - One 3 mm polyp in the sigmoid colon, removed with                            a cold snare. Resected and retrieved.                           - The distal rectum and anal verge are normal on                            retroflexion view. Recommendation:           - Patient has a contact number available for                            emergencies. The signs and symptoms of potential                            delayed complications were discussed with the                             patient. Return to normal activities tomorrow.                            Written discharge instructions were provided to the                            patient.                           - Resume previous diet.                           - Continue present medications.                           - Await pathology results.                           - Repeat colonoscopy in 5-10 years for surveillance  based on pathology results.                           - Return to GI office PRN. Doristine Locks, MD 06/27/2020 12:44:09 PM

## 2020-06-27 NOTE — Progress Notes (Deleted)
Pt's states no medical or surgical changes since previsit or office visit. 

## 2020-06-27 NOTE — Progress Notes (Signed)
PT taken to PACU. Monitors in place. VSS. Report given to RN. 

## 2020-07-02 ENCOUNTER — Telehealth: Payer: Self-pay

## 2020-07-02 NOTE — Telephone Encounter (Signed)
°  Follow up Call-  Call back number 06/27/2020  Post procedure Call Back phone  # [phone]  Permission to leave phone message Yes  Some recent data might be hidden     Patient questions:  Do you have a fever, pain , or abdominal swelling? No. Pain Score  0 *  Have you tolerated food without any problems? Yes.    Have you been able to return to your normal activities? Yes.    Do you have any questions about your discharge instructions: Diet   No. Medications  No. Follow up visit  No.  Do you have questions or concerns about your Care? No.  Actions: * If pain score is 4 or above: 1. No action needed, pain <4.Have you developed a fever since your procedure? non  2.   Have you had an respiratory symptoms (SOB or cough) since your procedure? o  3.   Have you tested positive for COVID 19 since your procedure no  4.   Have you had any family members/close contacts diagnosed with the COVID 19 since your procedure?  no   If yes to any of these questions please route to Joylene John, RN and Joella Prince, RN

## 2020-07-07 ENCOUNTER — Encounter: Payer: Self-pay | Admitting: Gastroenterology

## 2020-07-08 ENCOUNTER — Other Ambulatory Visit: Payer: Self-pay | Admitting: Medical

## 2020-07-08 NOTE — Telephone Encounter (Addendum)
Requesting: xanax Contract:11/29/2018 UDS:11/07/2018 Last Visit:05/26/20 Next Visit:n/a Last Refill:02/04/20  Please Advise  Sent in refills.   Needs controlled med visit in January. Please get her scheduled.

## 2020-08-15 ENCOUNTER — Ambulatory Visit: Payer: BC Managed Care – PPO | Admitting: Medical

## 2020-08-27 ENCOUNTER — Other Ambulatory Visit: Payer: Self-pay

## 2020-08-27 ENCOUNTER — Ambulatory Visit (INDEPENDENT_AMBULATORY_CARE_PROVIDER_SITE_OTHER): Payer: BC Managed Care – PPO | Admitting: Medical

## 2020-08-27 ENCOUNTER — Encounter: Payer: Self-pay | Admitting: Medical

## 2020-08-27 VITALS — BP 120/78 | HR 84 | Temp 98.0°F | Resp 12 | Ht 65.0 in | Wt 194.8 lb

## 2020-08-27 DIAGNOSIS — I10 Essential (primary) hypertension: Secondary | ICD-10-CM

## 2020-08-27 DIAGNOSIS — E785 Hyperlipidemia, unspecified: Secondary | ICD-10-CM | POA: Diagnosis not present

## 2020-08-27 DIAGNOSIS — G43809 Other migraine, not intractable, without status migrainosus: Secondary | ICD-10-CM

## 2020-08-27 DIAGNOSIS — K219 Gastro-esophageal reflux disease without esophagitis: Secondary | ICD-10-CM

## 2020-08-27 MED ORDER — OMEPRAZOLE 40 MG PO CPDR: 40.0000 mg | DELAYED_RELEASE_CAPSULE | Freq: Every day | ORAL | 11 refills | Status: DC

## 2020-08-27 MED ORDER — SUMATRIPTAN SUCCINATE 50 MG PO TABS: 50.0000 mg | ORAL_TABLET | ORAL | 2 refills | Status: DC | PRN

## 2020-08-27 NOTE — Patient Instructions (Addendum)
For gerd, eat healthy diet and restart omeprazole. If this is not resolving symptoms then refer to GI MD.  For hx of migraine refilled your imitrex and continue topamax. Filled out fmla form today in office.  For htn continue hctz but check your bp 2 times a week. Verify in good range. Not too high or too low. First reading hear was low but checked over sweater. Recheck good reading.  Follow up 4 months or as needed

## 2020-08-27 NOTE — Progress Notes (Signed)
Subjective:    Patient ID: Katie Merritt, female    DOB: [birth year], 49 y.o.   MRN: [id number]  HPI  Pt has some hx of intermittent severe reflux.  Pt states in past when she uses omeprazole symptoms were controlled.   Last time I saw her gave 30 tab rx. Wanted her to follow up.   Pt states early October her symptoms reoccured.  Pt takes tums daily and alkazeltzer.   Hx of migraine has as well. She states needs refill of imitrex, Last migraine beginning of October. Pt is still on topamax.  Pt has hx of htn. Pt is on hctz.   Pt needs form fill out for fmla/related to ha.   Review of Systems  Constitutional: Negative for chills, fatigue and fever.  Respiratory: Negative for cough, chest tightness, shortness of breath and wheezing.   Cardiovascular: Negative for chest pain and palpitations.  Gastrointestinal: Negative for abdominal pain.  Musculoskeletal: Negative for back pain and joint swelling.  Neurological: Negative for dizziness, speech difficulty, numbness and headaches.  Hematological: Negative for adenopathy. Does not bruise/bleed easily.  Psychiatric/Behavioral: Negative for behavioral problems, decreased concentration and dysphoric mood.    Past Medical History:  Diagnosis Date  . Anxiety   . COPD (chronic obstructive pulmonary disease) (HCC)   . Hyperlipidemia   . Hypertension      Social History   Socioeconomic History  . Marital status: Single    Spouse name: Not on file  . Number of children: Not on file  . Years of education: Not on file  . Highest education level: Not on file  Occupational History  . Not on file  Tobacco Use  . Smoking status: Former Smoker    Types: Cigarettes    Quit date: 11/25/2013    Years since quitting: 6.7  . Smokeless tobacco: Never Used  Vaping Use  . Vaping Use: Never used  Substance and Sexual Activity  . Alcohol use: Yes    Alcohol/week: 0.0 standard drinks    Comment: occ  . Drug use: Not Currently  .  Sexual activity: Not on file  Other Topics Concern  . Not on file  Social History Narrative  . Not on file   Social Determinants of Health   Financial Resource Strain:   . Difficulty of Paying Living Expenses: Not on file  Food Insecurity:   . Worried About Programme researcher, broadcasting/film/video in the Last Year: Not on file  . Ran Out of Food in the Last Year: Not on file  Transportation Needs:   . Lack of Transportation (Medical): Not on file  . Lack of Transportation (Non-Medical): Not on file  Physical Activity:   . Days of Exercise per Week: Not on file  . Minutes of Exercise per Session: Not on file  Stress:   . Feeling of Stress : Not on file  Social Connections:   . Frequency of Communication with Friends and Family: Not on file  . Frequency of Social Gatherings with Friends and Family: Not on file  . Attends Religious Services: Not on file  . Active Member of Clubs or Organizations: Not on file  . Attends Banker Meetings: Not on file  . Marital Status: Not on file  Intimate Partner Violence:   . Fear of Current or Ex-Partner: Not on file  . Emotionally Abused: Not on file  . Physically Abused: Not on file  . Sexually Abused: Not on file    Past Surgical  History:  Procedure Laterality Date  . Leap    . TUBAL LIGATION      Family History  Problem Relation Age of Onset  . Hyperlipidemia Mother   . Hypertension Mother   . Colon cancer Neg Hx   . Colon polyps Neg Hx   . Esophageal cancer Neg Hx   . Rectal cancer Neg Hx   . Stomach cancer Neg Hx     No Known Allergies  Current Outpatient Medications on File Prior to Visit  Medication Sig Dispense Refill  . ALPRAZolam (XANAX) 0.25 MG tablet TAKE 1 TABLET BY MOUTH EVERY DAY AS NEEDED FOR ANXIETY 30 tablet 3  . amLODipine-olmesartan (AZOR) 10-40 MG tablet Take 1 tablet by mouth daily. 30 tablet 0  . hydrochlorothiazide (MICROZIDE) 12.5 MG capsule TAKE 1 CAPSULE BY MOUTH EVERY DAY 90 capsule 3  . levonorgestrel  (MIRENA, 52 MG,) 20 MCG/24HR IUD Mirena 20 mcg/24 hours (5 yrs) 52 mg intrauterine device  Take 1 device by intrauterine route.    Marland Kitchen omeprazole (PRILOSEC) 40 MG capsule Take 1 capsule (40 mg total) by mouth daily. 30 capsule 0  . ondansetron (ZOFRAN ODT) 4 MG disintegrating tablet Take 1 tablet (4 mg total) by mouth every 8 (eight) hours as needed for nausea or vomiting. 20 tablet 0  . topiramate (TOPAMAX) 25 MG tablet 1 tab po  twice daily 60 tablet 3  . venlafaxine XR (EFFEXOR-XR) 75 MG 24 hr capsule TAKE 1 CAPSULE (75 MG TOTAL) BY MOUTH DAILY WITH BREAKFAST. 30 capsule 5   No current facility-administered medications on file prior to visit.    BP 104/74 (BP Location: Right Arm, Cuff Size: Large)   Pulse 84   Temp 98 F (36.7 C) (Oral)   Resp 12   Ht 5\' 5"  (1.651 m)   Wt 194 lb 12.8 oz (88.4 kg)   SpO2 100%   BMI 32.42 kg/m       Objective:   Physical Exam  General Mental Status- Alert. General Appearance- Not in acute distress.   Skin General: Color- Normal Color. Moisture- Normal Moisture.  Neck Carotid Arteries- Normal color. Moisture- Normal Moisture. No carotid bruits. No JVD.  Chest and Lung Exam Auscultation: Breath Sounds:-Normal.  Cardiovascular Auscultation:Rythm- Regular. Murmurs & Other Heart Sounds:Auscultation of the heart reveals- No Murmurs.  Abdomen Inspection:-Inspeection Normal. Palpation/Percussion:Note:No mass. Palpation and Percussion of the abdomen reveal- Non Tender, Non Distended + BS, no rebound or guarding.    Neurologic Cranial Nerve exam:- CN III-XII intact(No nystagmus), symmetric smile. Strength:- 5/5 equal and symmetric strength both upper and lower extremities.      Assessment & Plan:  For gerd, eat healthy diet and restart omeprazole. If this is not resolving symptoms then refer to GI MD.  For hx of migraine refilled your imitrex and continue topamax. Filled out fmla form today in office.  For htn continue hctz but check  your bp 2 times a week. Verify in good range. Not too high or too low. First reading hear was low but checked over sweater. Recheck good reading.  Follow up 4 months or as needed

## 2020-08-28 ENCOUNTER — Telehealth: Payer: Self-pay | Admitting: Medical

## 2020-08-28 LAB — LIPID PANEL
Cholesterol: 253 mg/dL — ABNORMAL HIGH (ref <200)
HDL: 52 mg/dL (ref >=50)
LDL Cholesterol (Calc): 163 mg/dL (calc) — ABNORMAL HIGH
Non-HDL Cholesterol (Calc): 201 mg/dL (calc) — ABNORMAL HIGH (ref <130)
Total CHOL/HDL Ratio: 4.9 (calc) (ref <5.0)
Triglycerides: 224 mg/dL — ABNORMAL HIGH (ref <150)

## 2020-08-28 LAB — COMPREHENSIVE METABOLIC PANEL
AG Ratio: 1.5 (calc) (ref 1.0–2.5)
ALT: 14 U/L (ref 6–29)
AST: 15 U/L (ref 10–35)
Albumin: 4.4 g/dL (ref 3.6–5.1)
Alkaline phosphatase (APISO): 120 U/L (ref 31–125)
BUN/Creatinine Ratio: 12 (calc) (ref 6–22)
BUN: 15 mg/dL (ref 7–25)
CO2: 27 mmol/L (ref 20–32)
Calcium: 10.5 mg/dL — ABNORMAL HIGH (ref 8.6–10.2)
Chloride: 99 mmol/L (ref 98–110)
Creat: 1.29 mg/dL — ABNORMAL HIGH (ref 0.50–1.10)
Globulin: 3 g/dL (calc) (ref 1.9–3.7)
Glucose, Bld: 90 mg/dL (ref 65–99)
Potassium: 4.5 mmol/L (ref 3.5–5.3)
Sodium: 136 mmol/L (ref 135–146)
Total Bilirubin: 0.5 mg/dL (ref 0.2–1.2)
Total Protein: 7.4 g/dL (ref 6.1–8.1)

## 2020-08-28 NOTE — Telephone Encounter (Signed)
Opened to review 

## 2020-08-30 ENCOUNTER — Telehealth: Payer: Self-pay | Admitting: Medical

## 2020-08-30 MED ORDER — ATORVASTATIN CALCIUM 10 MG PO TABS: 10.0000 mg | ORAL_TABLET | Freq: Every day | ORAL | 3 refills | Status: DC

## 2020-08-30 NOTE — Telephone Encounter (Signed)
Rx atorvastatin sent to pt pharmacy. 

## 2020-09-05 DIAGNOSIS — N898 Other specified noninflammatory disorders of vagina: Secondary | ICD-10-CM | POA: Diagnosis not present

## 2020-09-30 ENCOUNTER — Other Ambulatory Visit: Payer: Self-pay | Admitting: Medical

## 2020-10-07 DIAGNOSIS — Z0184 Encounter for antibody response examination: Secondary | ICD-10-CM | POA: Diagnosis not present

## 2020-10-07 DIAGNOSIS — J029 Acute pharyngitis, unspecified: Secondary | ICD-10-CM | POA: Diagnosis not present

## 2020-10-29 ENCOUNTER — Ambulatory Visit: Payer: BC Managed Care – PPO | Admitting: Medical

## 2020-10-30 DIAGNOSIS — U071 COVID-19: Secondary | ICD-10-CM | POA: Diagnosis not present

## 2021-01-07 ENCOUNTER — Other Ambulatory Visit: Payer: Self-pay | Admitting: Medical

## 2021-01-07 NOTE — Telephone Encounter (Signed)
Requesting: xanax Contract:n/a UDS:10/2018 Last Visit:08/2020 Next Visit:n/a Last Refill:06/2020  Please Advise

## 2021-01-08 NOTE — Telephone Encounter (Signed)
Not on contract. So need to have her schedule office visit to discuss need for this med. Potential frequency of use in future? Mabye uds if placed on contract.  Please schedule pt in office visit.

## 2021-01-13 NOTE — Telephone Encounter (Signed)
Pt called and lvm to return call 

## 2021-01-16 ENCOUNTER — Ambulatory Visit: Payer: BC Managed Care – PPO | Admitting: Medical

## 2021-01-16 NOTE — Telephone Encounter (Signed)
Pt has appointment 01/16/21

## 2021-02-11 ENCOUNTER — Encounter: Payer: Self-pay | Admitting: Medical

## 2021-02-16 ENCOUNTER — Telehealth: Payer: Self-pay | Admitting: Medical

## 2021-02-16 NOTE — Telephone Encounter (Signed)
Pt has a form for me to fill out. Work Diplomatic Services operational officer. Need to discuss this with her. Virtual visit would be adequate if only discussing the form. Schedule this week.

## 2021-02-20 ENCOUNTER — Other Ambulatory Visit: Payer: Self-pay

## 2021-02-20 ENCOUNTER — Ambulatory Visit (INDEPENDENT_AMBULATORY_CARE_PROVIDER_SITE_OTHER): Payer: BC Managed Care – PPO | Admitting: Medical

## 2021-02-20 VITALS — BP 147/90 | HR 111 | Resp 18 | Ht 65.0 in | Wt 191.0 lb

## 2021-02-20 DIAGNOSIS — E785 Hyperlipidemia, unspecified: Secondary | ICD-10-CM

## 2021-02-20 DIAGNOSIS — I10 Essential (primary) hypertension: Secondary | ICD-10-CM

## 2021-02-20 DIAGNOSIS — K219 Gastro-esophageal reflux disease without esophagitis: Secondary | ICD-10-CM

## 2021-02-20 DIAGNOSIS — Z79899 Other long term (current) drug therapy: Secondary | ICD-10-CM | POA: Diagnosis not present

## 2021-02-20 DIAGNOSIS — F411 Generalized anxiety disorder: Secondary | ICD-10-CM

## 2021-02-20 MED ORDER — ALPRAZOLAM 0.5 MG PO TABS: ORAL_TABLET | ORAL | 1 refills | Status: DC

## 2021-02-20 NOTE — Progress Notes (Signed)
Subjective:    Patient ID: Katie Merritt, female    DOB: 10/01/1971, 50 y.o.   MRN: [id number]  HPI  Pt in for follow up on chronic med problems.  Pt states has not taken azor today. Also past 2 weeks she has not taken diuretic. Pt takes diuretic in morning when gets to work.  Pt states urinating a lot during the day. Pt stopped diuretic and urinating normally. 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 Pt does not want to switch meds.  Gerd controlled with prilosec.  High choleserol hx. On atorvastatin.  Hx of anxiety. On contract in the past. Had been on xanax. Work related chronic anxiety. Sometimes severe. Usually does not have to take on weekends.  Pt has high cholesterol. Fasting presently. Pt still on lipitor.  Review of Systems  Constitutional: Negative for chills and fatigue.  Respiratory: Negative for chest tightness, shortness of breath and wheezing.   Cardiovascular: Negative for chest pain and palpitations.  Gastrointestinal: Negative for abdominal pain.  Genitourinary:       No gu symptoms. No frequency urinatoin when hctz stopped.  Musculoskeletal: Negative for back pain.  Neurological: Negative for dizziness, numbness and headaches.  Hematological: Negative for adenopathy. Does not bruise/bleed easily.  Psychiatric/Behavioral: Negative for agitation, behavioral problems, confusion, dysphoric mood and suicidal ideas. The patient is nervous/anxious.     Past Medical History:  Diagnosis Date  . Anxiety   . COPD (chronic obstructive pulmonary disease) (HCC)   . Hyperlipidemia   . Hypertension      Social History   Socioeconomic History  . Marital status: Single    Spouse name: Not on file  . Number of children: Not on file  . Years of education: Not on file  . Highest education level: Not on file  Occupational History  . Not  on file  Tobacco Use  . Smoking status: Former Smoker    Types: Cigarettes    Quit date: 11/25/2013    Years since quitting: 7.2  . Smokeless tobacco: Never Used  Vaping Use  . Vaping Use: Never used  Substance and Sexual Activity  . Alcohol use: Yes    Alcohol/week: 0.0 standard drinks    Comment: occ  . Drug use: Not Currently  . Sexual activity: Not on file  Other Topics Concern  . Not on file  Social History Narrative  . Not on file   Social Determinants of Health   Financial Resource Strain: Not on file  Food Insecurity: Not on file  Transportation Needs: Not on file  Physical Activity: Not on file  Stress: Not on file  Social Connections: Not on file  Intimate Partner Violence: Not on file    Past Surgical History:  Procedure Laterality Date  . Leap    . TUBAL LIGATION      Family History  Problem Relation Age of Onset  . Hyperlipidemia Mother   . Hypertension Mother   . Colon cancer Neg Hx   . Colon polyps Neg Hx   . Esophageal cancer Neg Hx   . Rectal cancer Neg Hx   . Stomach cancer Neg Hx     No Known Allergies  Current Outpatient Medications on File Prior to Visit  Medication Sig Dispense Refill  . ALPRAZolam (XANAX) 0.25 MG tablet TAKE 1 TABLET BY MOUTH EVERY DAY AS NEEDED FOR  ANXIETY 30 tablet 3  . amLODipine-olmesartan (AZOR) 10-40 MG tablet Take 1 tablet by mouth daily. 90 tablet 1  . atorvastatin (LIPITOR) 10 MG tablet Take 1 tablet (10 mg total) by mouth daily. 30 tablet 3  . hydrochlorothiazide (MICROZIDE) 12.5 MG capsule TAKE 1 CAPSULE BY MOUTH EVERY DAY 90 capsule 3  . levonorgestrel (MIRENA) 20 MCG/24HR IUD Mirena 20 mcg/24 hours (5 yrs) 52 mg intrauterine device  Take 1 device by intrauterine route.    Marland Kitchen omeprazole (PRILOSEC) 40 MG capsule Take 1 capsule (40 mg total) by mouth daily. 30 capsule 11  . ondansetron (ZOFRAN ODT) 4 MG disintegrating tablet Take 1 tablet (4 mg total) by mouth every 8 (eight) hours as needed for nausea or  vomiting. 20 tablet 0  . SUMAtriptan (IMITREX) 50 MG tablet Take 1 tablet (50 mg total) by mouth every 2 (two) hours as needed for migraine. May repeat in 2 hours if headache persists or recurs. 10 tablet 2  . topiramate (TOPAMAX) 25 MG tablet 1 tab po  twice daily 60 tablet 3  . venlafaxine XR (EFFEXOR-XR) 75 MG 24 hr capsule TAKE 1 CAPSULE (75 MG TOTAL) BY MOUTH DAILY WITH BREAKFAST. 30 capsule 5   No current facility-administered medications on file prior to visit.    BP (!) 147/90   Pulse (!) 111   Resp 18   Ht 5\' 5"  (1.651 m)   Wt 191 lb (86.6 kg)   SpO2 100%   BMI 31.78 kg/m       Objective:   Physical Exam  General Mental Status- Alert. General Appearance- Not in acute distress.   Skin General: Color- Normal Color. Moisture- Normal Moisture.   Chest and Lung Exam Auscultation: Breath Sounds:-Normal.  Cardiovascular Auscultation:Rythm- Regular. Murmurs & Other Heart Sounds:Auscultation of the heart reveals- No Murmurs.  .    Neurologic Cranial Nerve exam:- CN III-XII intact(No nystagmus), symmetric smile. Strength:- 5/5 equal and symmetric strength both upper and lower extremities.      Assessment & Plan:  Your blood pressure is elevated today but you have not been on BP medication.  Please restart both medications Azor and HCTZ.   Increased frequency of urination slight with HCTZ.  Work accommodation form filled out today.  Offered to change HCTZ to beta-blocker but since her blood pressure has been well controlled change in medication declined.  I think that is reasonable.  GERD well-controlled with omeprazole.  History of hyperlipidemia.  Currently on atorvastatin.  We will get fasting lipid panel and metabolic panel.  History of anxiety daily basis with increased levels at work.  Refilling your Xanax and getting you to sign controlled medication contract and give UDS.  Follow-up in 2 to 3 months or as needed.  Esperanza Richters, PA-C

## 2021-02-20 NOTE — Patient Instructions (Addendum)
Your blood pressure is elevated today but you have not been on BP medication.  Please restart both medications Azor and HCTZ.   Increased frequency of urination slight with HCTZ.  Work accommodation form filled out today.  Offered to change HCTZ to beta-blocker but since her blood pressure has been well controlled change in medication declined.  I think that is reasonable.  GERD well-controlled with omeprazole.  History of hyperlipidemia.  Currently on atorvastatin.  We will get fasting lipid panel and metabolic panel.  History of anxiety daily basis with increased levels at work.  Refilling your Xanax and getting you to sign controlled medication contract and give UDS.   Follow-up in 2 to 3 months or as needed.

## 2021-02-21 ENCOUNTER — Telehealth: Payer: Self-pay | Admitting: Medical

## 2021-02-21 MED ORDER — ATORVASTATIN CALCIUM 20 MG PO TABS: 20.0000 mg | ORAL_TABLET | Freq: Every day | ORAL | 3 refills | Status: DC

## 2021-02-21 NOTE — Telephone Encounter (Signed)
Rx atorvastatin sent to pt pharmacy. 

## 2021-02-22 LAB — LIPID PANEL
Cholesterol: 248 mg/dL — ABNORMAL HIGH (ref <200)
HDL: 43 mg/dL — ABNORMAL LOW (ref >=50)
LDL Cholesterol (Calc): 165 mg/dL (calc) — ABNORMAL HIGH
Non-HDL Cholesterol (Calc): 205 mg/dL (calc) — ABNORMAL HIGH (ref <130)
Total CHOL/HDL Ratio: 5.8 (calc) — ABNORMAL HIGH (ref <5.0)
Triglycerides: 238 mg/dL — ABNORMAL HIGH (ref <150)

## 2021-02-22 LAB — DRUG MONITORING, PANEL 8 WITH CONFIRMATION, URINE
6 Acetylmorphine: NEGATIVE ng/mL (ref <10)
Alcohol Metabolites: POSITIVE ng/mL — AB
Amphetamines: NEGATIVE ng/mL (ref <500)
Benzodiazepines: NEGATIVE ng/mL (ref <100)
Buprenorphine, Urine: NEGATIVE ng/mL (ref <5)
Cocaine Metabolite: NEGATIVE ng/mL (ref <150)
Creatinine: >300 mg/dL
Ethyl Glucuronide (ETG): >100000 ng/mL — ABNORMAL HIGH (ref <500)
Ethyl Sulfate (ETS): 95947 ng/mL — ABNORMAL HIGH (ref <100)
MDMA: NEGATIVE ng/mL (ref <500)
Marijuana Metabolite: 120 ng/mL — ABNORMAL HIGH (ref <5)
Marijuana Metabolite: POSITIVE ng/mL — AB (ref <20)
Opiates: NEGATIVE ng/mL (ref <100)
Oxidant: NEGATIVE ug/mL
Oxycodone: NEGATIVE ng/mL (ref <100)
pH: 6.1 (ref 4.5–9.0)

## 2021-02-22 LAB — COMPREHENSIVE METABOLIC PANEL
AG Ratio: 1.5 (calc) (ref 1.0–2.5)
ALT: 16 U/L (ref 6–29)
AST: 20 U/L (ref 10–35)
Albumin: 4.3 g/dL (ref 3.6–5.1)
Alkaline phosphatase (APISO): 129 U/L — ABNORMAL HIGH (ref 31–125)
BUN/Creatinine Ratio: 8 (calc) (ref 6–22)
BUN: 9 mg/dL (ref 7–25)
CO2: 24 mmol/L (ref 20–32)
Calcium: 9.6 mg/dL (ref 8.6–10.2)
Chloride: 103 mmol/L (ref 98–110)
Creat: 1.15 mg/dL — ABNORMAL HIGH (ref 0.50–1.10)
Globulin: 2.9 g/dL (calc) (ref 1.9–3.7)
Glucose, Bld: 88 mg/dL (ref 65–99)
Potassium: 3.7 mmol/L (ref 3.5–5.3)
Sodium: 139 mmol/L (ref 135–146)
Total Bilirubin: 0.3 mg/dL (ref 0.2–1.2)
Total Protein: 7.2 g/dL (ref 6.1–8.1)

## 2021-02-22 LAB — DM TEMPLATE

## 2021-02-23 DIAGNOSIS — Z1231 Encounter for screening mammogram for malignant neoplasm of breast: Secondary | ICD-10-CM | POA: Diagnosis not present

## 2021-02-27 ENCOUNTER — Other Ambulatory Visit: Payer: Self-pay

## 2021-02-27 ENCOUNTER — Ambulatory Visit (INDEPENDENT_AMBULATORY_CARE_PROVIDER_SITE_OTHER): Payer: BC Managed Care – PPO | Admitting: Medical

## 2021-02-27 ENCOUNTER — Encounter: Payer: Self-pay | Admitting: Medical

## 2021-02-27 ENCOUNTER — Other Ambulatory Visit: Payer: Self-pay | Admitting: Medical

## 2021-02-27 VITALS — BP 130/80 | HR 98 | Temp 98.1°F | Resp 18 | Ht 65.0 in | Wt 194.4 lb

## 2021-02-27 DIAGNOSIS — I10 Essential (primary) hypertension: Secondary | ICD-10-CM

## 2021-02-27 DIAGNOSIS — R0981 Nasal congestion: Secondary | ICD-10-CM | POA: Diagnosis not present

## 2021-02-27 DIAGNOSIS — R059 Cough, unspecified: Secondary | ICD-10-CM

## 2021-02-27 DIAGNOSIS — G43809 Other migraine, not intractable, without status migrainosus: Secondary | ICD-10-CM

## 2021-02-27 DIAGNOSIS — R0982 Postnasal drip: Secondary | ICD-10-CM

## 2021-02-27 MED ORDER — LEVOCETIRIZINE DIHYDROCHLORIDE 5 MG PO TABS: 5.0000 mg | ORAL_TABLET | Freq: Every evening | ORAL | 3 refills | Status: DC

## 2021-02-27 MED ORDER — BENZONATATE 100 MG PO CAPS: 100.0000 mg | ORAL_CAPSULE | Freq: Three times a day (TID) | ORAL | 0 refills | Status: DC | PRN

## 2021-02-27 MED ORDER — SUMATRIPTAN SUCCINATE 50 MG PO TABS: 50.0000 mg | ORAL_TABLET | ORAL | 11 refills | Status: DC | PRN

## 2021-02-27 MED ORDER — FLUTICASONE PROPIONATE 50 MCG/ACT NA SUSP: 2.0000 | Freq: Every day | NASAL | 1 refills | Status: DC

## 2021-02-27 NOTE — Progress Notes (Signed)
Subjective:    Patient ID: Katie Merritt, female    DOB: [birth year], 50 y.o.   MRN: [id number]  HPI  Pt in for hx of migraine HA. Last headache was last week. She missed work last Wednesday a week ago.   Pt tells me she is having ha about twice a month.  She states will miss a day or half a day.   Pt states last time filled out she thinks filled out   Pt is still taking imitrex. Most of time if ha start she can stop the HA with imitrex just one tab. Makes ha bearable and then subsides in 24 hours at most. Pt is still on topamax daily.   Pt bp is elevated today. Mild high on initial check. No cardiac or neurologic signs or symptoms.   Pt states also recent intermittent dry cough. Sometimes dry heaves so severe. No wheezing or shortness. Pt states feels constant pnd. No fever, no chills or sweats. Negative covid test. Pt not feeling any heart burn. Some sneezing. Mild nasal congestion. Clears throat all day.      Review of Systems  Constitutional: Negative for chills and fatigue.  Respiratory: Negative for cough, chest tightness, shortness of breath and wheezing.   Cardiovascular: Negative for chest pain and palpitations.  Gastrointestinal: Negative for abdominal pain and blood in stool.  Genitourinary: Negative for dysuria.  Musculoskeletal: Negative for back pain and neck pain.  Skin: Negative for rash.  Neurological: Negative for dizziness, speech difficulty, weakness, numbness and headaches.  Hematological: Negative for adenopathy. Does not bruise/bleed easily.  Psychiatric/Behavioral: Negative for behavioral problems, decreased concentration and hallucinations. The patient is not nervous/anxious and is not hyperactive.     Past Medical History:  Diagnosis Date  . Anxiety   . COPD (chronic obstructive pulmonary disease) (HCC)   . Hyperlipidemia   . Hypertension      Social History   Socioeconomic History  . Marital status: Single    Spouse name: Not on file  .  Number of children: Not on file  . Years of education: Not on file  . Highest education level: Not on file  Occupational History  . Not on file  Tobacco Use  . Smoking status: Former Smoker    Types: Cigarettes    Quit date: 11/25/2013    Years since quitting: 7.2  . Smokeless tobacco: Never Used  Vaping Use  . Vaping Use: Never used  Substance and Sexual Activity  . Alcohol use: Yes    Alcohol/week: 0.0 standard drinks    Comment: occ  . Drug use: Not Currently  . Sexual activity: Not on file  Other Topics Concern  . Not on file  Social History Narrative  . Not on file   Social Determinants of Health   Financial Resource Strain: Not on file  Food Insecurity: Not on file  Transportation Needs: Not on file  Physical Activity: Not on file  Stress: Not on file  Social Connections: Not on file  Intimate Partner Violence: Not on file    Past Surgical History:  Procedure Laterality Date  . Leap    . TUBAL LIGATION      Family History  Problem Relation Age of Onset  . Hyperlipidemia Mother   . Hypertension Mother   . Colon cancer Neg Hx   . Colon polyps Neg Hx   . Esophageal cancer Neg Hx   . Rectal cancer Neg Hx   . Stomach cancer Neg Hx  No Known Allergies  Current Outpatient Medications on File Prior to Visit  Medication Sig Dispense Refill  . ALPRAZolam (XANAX) 0.25 MG tablet TAKE 1 TABLET BY MOUTH EVERY DAY AS NEEDED FOR ANXIETY 30 tablet 3  . ALPRAZolam (XANAX) 0.5 MG tablet 1 tab po q day prn anxiety 30 tablet 1  . amLODipine-olmesartan (AZOR) 10-40 MG tablet Take 1 tablet by mouth daily. 90 tablet 1  . atorvastatin (LIPITOR) 20 MG tablet Take 1 tablet (20 mg total) by mouth daily. 90 tablet 3  . hydrochlorothiazide (MICROZIDE) 12.5 MG capsule TAKE 1 CAPSULE BY MOUTH EVERY DAY 90 capsule 3  . levonorgestrel (MIRENA) 20 MCG/24HR IUD Mirena 20 mcg/24 hours (5 yrs) 52 mg intrauterine device  Take 1 device by intrauterine route.    Marland Kitchen omeprazole (PRILOSEC)  40 MG capsule Take 1 capsule (40 mg total) by mouth daily. 30 capsule 11  . ondansetron (ZOFRAN ODT) 4 MG disintegrating tablet Take 1 tablet (4 mg total) by mouth every 8 (eight) hours as needed for nausea or vomiting. 20 tablet 0  . SUMAtriptan (IMITREX) 50 MG tablet Take 1 tablet (50 mg total) by mouth every 2 (two) hours as needed for migraine. May repeat in 2 hours if headache persists or recurs. 10 tablet 2  . topiramate (TOPAMAX) 25 MG tablet 1 tab po  twice daily 60 tablet 3  . venlafaxine XR (EFFEXOR-XR) 75 MG 24 hr capsule TAKE 1 CAPSULE (75 MG TOTAL) BY MOUTH DAILY WITH BREAKFAST. 30 capsule 5   No current facility-administered medications on file prior to visit.    BP (!) 144/96   Pulse 98   Temp 98.1 F (36.7 C)   Resp 18   Ht 5\' 5"  (1.651 m)   Wt 194 lb 6.4 oz (88.2 kg)   SpO2 100%   BMI 32.35 kg/m       Objective:   Physical Exam  General Mental Status- Alert. General Appearance- Not in acute distress.   Skin General: Color- Normal Color. Moisture- Normal Moisture.  Neck Carotid Arteries- Normal color. Moisture- Normal Moisture. No carotid bruits. No JVD.  Chest and Lung Exam Auscultation: Breath Sounds:-Normal.  Cardiovascular Auscultation:Rythm- Regular. Murmurs & Other Heart Sounds:Auscultation of the heart reveals- No Murmurs.  Abdomen Inspection:-Inspeection Normal. Palpation/Percussion:Note:No mass. Palpation and Percussion of the abdomen reveal- Non Tender, Non Distended + BS, no rebound or guarding.   Neurologic Cranial Nerve exam:- CN III-XII intact(No nystagmus), symmetric smile. Strength:- 5/5 equal and symmetric strength both upper and lower extremities.     Assessment & Plan:  Migraine reasonably well controlled. Refilled imitrex. Continue topomax as needed. Completed fmla form.  Htn history. Bp well controlled on recheck. Continue azor.  Recent probable allergic rhinitis since nasal congestion, pnd and cough. Rx xyzal, flonase and  benzonatate. If does not resolved symptoms consider antibiotic and chest xray.  Discussed uds today results and plan going forward. Can't prescribe benzo routine basis. But consider very low number for brief period  for extreme anxiety events if needed.  Follow up 7-10 days or as needed  Esperanza Richters, PA-C   Time spent with patient today was 32  minutes which consisted of chart review, discussing diagnosis, work up treatment and documentation.

## 2021-02-27 NOTE — Patient Instructions (Addendum)
Migraine reasonably well controlled. Refilled imitrex. Continue topomax as needed. Completed fmla form.  Htn history. Bp well controlled on recheck. Continue azor.  Recent probable allergic rhinitis since nasal congestion, pnd and cough. Rx xyzal, flonase and benzonatate. If does not resolved symptoms consider antibiotic and chest xray.   Follow up 7-10 days or as needed

## 2021-03-02 ENCOUNTER — Encounter: Payer: Self-pay | Admitting: Medical

## 2021-03-24 ENCOUNTER — Encounter: Payer: Self-pay | Admitting: Medical

## 2021-03-25 ENCOUNTER — Telehealth: Payer: BC Managed Care – PPO | Admitting: Medical

## 2021-06-26 ENCOUNTER — Other Ambulatory Visit: Payer: Self-pay | Admitting: Medical

## 2021-07-17 ENCOUNTER — Ambulatory Visit (INDEPENDENT_AMBULATORY_CARE_PROVIDER_SITE_OTHER): Payer: BC Managed Care – PPO | Admitting: Medical

## 2021-07-17 ENCOUNTER — Encounter: Payer: Self-pay | Admitting: Medical

## 2021-07-17 ENCOUNTER — Other Ambulatory Visit: Payer: Self-pay

## 2021-07-17 VITALS — BP 130/80 | HR 88 | Temp 98.2°F | Resp 18 | Ht 65.0 in | Wt 202.6 lb

## 2021-07-17 DIAGNOSIS — R195 Other fecal abnormalities: Secondary | ICD-10-CM

## 2021-07-17 DIAGNOSIS — K219 Gastro-esophageal reflux disease without esophagitis: Secondary | ICD-10-CM | POA: Diagnosis not present

## 2021-07-17 DIAGNOSIS — L708 Other acne: Secondary | ICD-10-CM | POA: Diagnosis not present

## 2021-07-17 DIAGNOSIS — R21 Rash and other nonspecific skin eruption: Secondary | ICD-10-CM

## 2021-07-17 DIAGNOSIS — G43809 Other migraine, not intractable, without status migrainosus: Secondary | ICD-10-CM

## 2021-07-17 MED ORDER — CLINDAMYCIN PHOSPHATE 1 % EX GEL: Freq: Two times a day (BID) | CUTANEOUS | 0 refills | Status: DC

## 2021-07-17 MED ORDER — SUMATRIPTAN SUCCINATE 50 MG PO TABS: 50.0000 mg | ORAL_TABLET | ORAL | 11 refills | Status: DC | PRN

## 2021-07-17 NOTE — Patient Instructions (Signed)
Recent loose stools for weeks.  Occurring about 3-4 times a week with gassiness as well.  Recommend bland healthy diet and ordering stool panel studies.  Please try no stool panel studies and as soon as possible.  If all studies are negative then plan on prescribing Bentyl for possible IBS.  Recent rash to forehead.  On inspection some of the areas appear acne-like.  Other areas show some slight hypopigmentation.  I do have some concern for possible vitiligo.  Presently recommend moisturizing face well.  Prescribing clindamycin gel.  Recommend holding hydrocortisone presently.  Did place referral to dermatologist since you have recently been scheduled to 3 months.  Hopefully will be able to find dermatologist that can see you sooner.  History of GERD well-controlled on Prilosec.  History of migraine headaches usually well controlled with Imitrex and Topamax.  You exceeded your FMLA time allotted about 2 hours.  So we will need to fill out form.  Refilled your Imitrex today.  I will need some time to fill that form out.  Hoping to get it done by next week.  Follow-up in 2 to 3 weeks or sooner as needed.

## 2021-07-17 NOTE — Progress Notes (Signed)
Subjective:    Patient ID: Katie Merritt, female    DOB: 20-Feb-1971, 50 y.o.   MRN: [id number]  HPI Pt in for some recent gasy sensation and loose stools for about one month. Pt not eating a lot. Some days she will get 3-4 loose stools a day. She states about 3-4 days out of the week this will occur. She has not missed work for this.   Pt also has gerd. Those symptoms are controlled. Pt is on prilosec.  Pt also has fmla forms which ran out in august. This is for heads. Just filled out form in May 2022. She was 2 hours past the alloted time frame. This was for migraine ha in past. Pt is imitrex. She usually get 2 migraine ha a month. Usually one work early onset. Uses tompamax as well.  Pt also has slight scattered bumps on forehead and nose. But also faint hypopigmented. Pt has used hydrocortisone for 3 days.     Review of Systems  Constitutional:  Negative for chills, fatigue and fever.  Respiratory:  Negative for cough, chest tightness, shortness of breath and wheezing.   Cardiovascular:  Negative for chest pain and palpitations.  Gastrointestinal:  Positive for diarrhea. Negative for abdominal pain, blood in stool, rectal pain and vomiting.  Musculoskeletal:  Negative for back pain and myalgias.  Skin:  Positive for rash.       Skin see hpi and exam  Neurological:  Positive for headaches. Negative for dizziness, syncope, weakness and numbness.       See hpi. No ha presently.   Past Medical History:  Diagnosis Date   Anxiety    COPD (chronic obstructive pulmonary disease) (HCC)    Hyperlipidemia    Hypertension      Social History   Socioeconomic History   Marital status: Single    Spouse name: Not on file   Number of children: Not on file   Years of education: Not on file   Highest education level: Not on file  Occupational History   Not on file  Tobacco Use   Smoking status: Former    Types: Cigarettes    Quit date: 11/25/2013    Years since quitting: 7.6    Smokeless tobacco: Never  Vaping Use   Vaping Use: Never used  Substance and Sexual Activity   Alcohol use: Yes    Alcohol/week: 0.0 standard drinks    Comment: occ   Drug use: Not Currently   Sexual activity: Not on file  Other Topics Concern   Not on file  Social History Narrative   Not on file   Social Determinants of Health   Financial Resource Strain: Not on file  Food Insecurity: Not on file  Transportation Needs: Not on file  Physical Activity: Not on file  Stress: Not on file  Social Connections: Not on file  Intimate Partner Violence: Not on file    Past Surgical History:  Procedure Laterality Date   Leap     TUBAL LIGATION      Family History  Problem Relation Age of Onset   Hyperlipidemia Mother    Hypertension Mother    Colon cancer Neg Hx    Colon polyps Neg Hx    Esophageal cancer Neg Hx    Rectal cancer Neg Hx    Stomach cancer Neg Hx     No Known Allergies  Current Outpatient Medications on File Prior to Visit  Medication Sig Dispense Refill   ALPRAZolam (  XANAX) 0.25 MG tablet TAKE 1 TABLET BY MOUTH EVERY DAY AS NEEDED FOR ANXIETY 30 tablet 3   ALPRAZolam (XANAX) 0.5 MG tablet 1 tab po q day prn anxiety 30 tablet 1   amLODipine-olmesartan (AZOR) 10-40 MG tablet Take 1 tablet by mouth daily. 90 tablet 1   atorvastatin (LIPITOR) 20 MG tablet Take 1 tablet (20 mg total) by mouth daily. 90 tablet 3   fluticasone (FLONASE) 50 MCG/ACT nasal spray Place 2 sprays into both nostrils daily. 16 g 1   hydrochlorothiazide (MICROZIDE) 12.5 MG capsule TAKE 1 CAPSULE BY MOUTH EVERY DAY 90 capsule 1   levocetirizine (XYZAL) 5 MG tablet Take 1 tablet (5 mg total) by mouth every evening. 30 tablet 3   levonorgestrel (MIRENA) 20 MCG/24HR IUD Mirena 20 mcg/24 hours (5 yrs) 52 mg intrauterine device  Take 1 device by intrauterine route.     omeprazole (PRILOSEC) 40 MG capsule Take 1 capsule (40 mg total) by mouth daily. 30 capsule 11   ondansetron (ZOFRAN ODT) 4 MG  disintegrating tablet Take 1 tablet (4 mg total) by mouth every 8 (eight) hours as needed for nausea or vomiting. 20 tablet 0   topiramate (TOPAMAX) 25 MG tablet 1 tab po  twice daily 60 tablet 3   venlafaxine XR (EFFEXOR-XR) 75 MG 24 hr capsule TAKE 1 CAPSULE (75 MG TOTAL) BY MOUTH DAILY WITH BREAKFAST. 30 capsule 5   No current facility-administered medications on file prior to visit.    BP 130/80   Pulse 88   Temp 98.2 F (36.8 C)   Resp 18   Ht 5\' 5"  (1.651 m)   Wt 202 lb 9.6 oz (91.9 kg)   SpO2 100%   BMI 33.71 kg/m       Objective:   Physical Exam  General Mental Status- Alert. General Appearance- Not in acute distress.   Skin Scattered raised areas between eye brows and nose. Appears acne like. Some hyperpitmented of raised areas but some faint hypopigmentation area not raised.   Chest and Lung Exam Auscultation: Breath Sounds:-Normal.  Cardiovascular Auscultation:Rythm- Regular. Murmurs & Other Heart Sounds:Auscultation of the heart reveals- No Murmurs.  Abdomen Inspection:-Inspeection Normal. Palpation/Percussion:Note:No mass. Palpation and Percussion of the abdomen reveal- Non Tender, Non Distended + BS, no rebound or guarding.   Neurologic Cranial Nerve exam:- CN III-XII intact(No nystagmus), symmetric smile. Strength:- 5/5 equal and symmetric strength both upper and lower extremities.        Assessment & Plan:   Patient Instructions  Recent loose stools for weeks.  Occurring about 3-4 times a week with gassiness as well.  Recommend bland healthy diet and ordering stool panel studies.  Please try no stool panel studies and as soon as possible.  If all studies are negative then plan on prescribing Bentyl for possible IBS.  Recent rash to forehead.  On inspection some of the areas appear acne-like.  Other areas show some slight hypopigmentation.  I do have some concern for possible vitiligo.  Presently recommend moisturizing face well.  Prescribing  clindamycin gel.  Recommend holding hydrocortisone presently.  Did place referral to dermatologist since you have recently been scheduled to 3 months.  Hopefully will be able to find dermatologist that can see you sooner.  History of GERD well-controlled on Prilosec.  History of migraine headaches usually well controlled with Imitrex and Topamax.  You exceeded your FMLA time allotted about 2 hours.  So we will need to fill out form.  Refilled your Imitrex today.  I will need some time to fill that form out.  Hoping to get it done by next week.  Follow-up in 2 to 3 weeks or sooner as needed.    Time spent with patient today was 31  minutes which consisted of chart revdiew, discussing diagnosis, work up treatment and documentation.

## 2021-07-23 ENCOUNTER — Encounter: Payer: Self-pay | Admitting: Medical

## 2021-07-23 NOTE — Telephone Encounter (Signed)
FMLA paperwork in your red folder , please advise

## 2021-07-23 NOTE — Telephone Encounter (Signed)
Please advise 

## 2021-08-28 ENCOUNTER — Other Ambulatory Visit: Payer: Self-pay | Admitting: Medical

## 2021-08-29 ENCOUNTER — Other Ambulatory Visit: Payer: Self-pay | Admitting: Medical

## 2021-11-05 ENCOUNTER — Other Ambulatory Visit: Payer: Self-pay | Admitting: Medical

## 2021-11-27 ENCOUNTER — Ambulatory Visit: Payer: BC Managed Care – PPO | Admitting: Medical

## 2021-12-01 DIAGNOSIS — E559 Vitamin D deficiency, unspecified: Secondary | ICD-10-CM | POA: Diagnosis not present

## 2021-12-01 DIAGNOSIS — Z8742 Personal history of other diseases of the female genital tract: Secondary | ICD-10-CM | POA: Diagnosis not present

## 2021-12-01 DIAGNOSIS — Z30431 Encounter for routine checking of intrauterine contraceptive device: Secondary | ICD-10-CM | POA: Diagnosis not present

## 2021-12-01 DIAGNOSIS — Z01419 Encounter for gynecological examination (general) (routine) without abnormal findings: Secondary | ICD-10-CM | POA: Diagnosis not present

## 2021-12-01 DIAGNOSIS — N898 Other specified noninflammatory disorders of vagina: Secondary | ICD-10-CM | POA: Diagnosis not present

## 2021-12-04 ENCOUNTER — Ambulatory Visit: Payer: BC Managed Care – PPO | Admitting: Medical

## 2021-12-19 DIAGNOSIS — H60509 Unspecified acute noninfective otitis externa, unspecified ear: Secondary | ICD-10-CM | POA: Diagnosis not present

## 2021-12-19 DIAGNOSIS — T161XXA Foreign body in right ear, initial encounter: Secondary | ICD-10-CM | POA: Diagnosis not present

## 2022-01-27 DIAGNOSIS — B029 Zoster without complications: Secondary | ICD-10-CM | POA: Diagnosis not present

## 2022-05-11 DIAGNOSIS — H9202 Otalgia, left ear: Secondary | ICD-10-CM | POA: Diagnosis not present

## 2022-05-28 ENCOUNTER — Encounter: Payer: Self-pay | Admitting: Medical

## 2022-05-28 ENCOUNTER — Ambulatory Visit (INDEPENDENT_AMBULATORY_CARE_PROVIDER_SITE_OTHER): Payer: BC Managed Care – PPO | Admitting: Medical

## 2022-05-28 VITALS — BP 150/90 | HR 101 | Resp 18 | Ht 65.0 in | Wt 203.6 lb

## 2022-05-28 DIAGNOSIS — K219 Gastro-esophageal reflux disease without esophagitis: Secondary | ICD-10-CM | POA: Diagnosis not present

## 2022-05-28 DIAGNOSIS — I10 Essential (primary) hypertension: Secondary | ICD-10-CM | POA: Diagnosis not present

## 2022-05-28 DIAGNOSIS — G43809 Other migraine, not intractable, without status migrainosus: Secondary | ICD-10-CM | POA: Diagnosis not present

## 2022-05-28 MED ORDER — AMLODIPINE-OLMESARTAN 10-40 MG PO TABS: 1.0000 | ORAL_TABLET | Freq: Every day | ORAL | 1 refills | Status: DC

## 2022-05-28 MED ORDER — OMEPRAZOLE 40 MG PO CPDR: DELAYED_RELEASE_CAPSULE | ORAL | 11 refills | Status: DC

## 2022-05-28 MED ORDER — SUMATRIPTAN SUCCINATE 50 MG PO TABS: 50.0000 mg | ORAL_TABLET | ORAL | 11 refills | Status: DC | PRN

## 2022-05-28 NOTE — Patient Instructions (Addendum)
Htn- bp is high today but not on the azor. I refilled that today.   [name]- controlled with omeprazole. Refilling today.   Migraine- ha conrolled with imtrex and topamax combo. Fmla paperwork filled out.  For insominia- can continue benadryl as you are using. If on occasion does not work can add on melatonin 5 mg at night.   Follow up 2 weeks bp check nurse visit.

## 2022-05-28 NOTE — Progress Notes (Signed)
Subjective:    Patient ID: Katie Merritt, female    DOB: September 24, 1971, 51 y.o.   MRN: [id number]  HPI Pt in for follow up for follow [place], htn and gerd.  Pt in for hx of migraine HA. Last headache yesterday. That ha last 2 hours. Just amout to run out of imtrex. On average ha 2 times a month. At times needs to miss 3 days from work.      Pt is still taking imitrex. Most of time if ha start she can stop the HA with imitrex just one tab. Makes ha bearable and then subsides in 24 hours at most. Pt is still on topamax daily.  Htn- azor 10/40. Did not take bp med today. Has not been checking her bp. No cardiac or neurologic signs or symptoms.  Gerd- controlled on omeprazole.     Review of Systems  Constitutional:  Negative for chills and fatigue.  Respiratory:  Negative for cough, chest tightness, shortness of breath and wheezing.   Cardiovascular:  Negative for chest pain and palpitations.  Gastrointestinal:  Negative for abdominal pain and blood in stool.  Genitourinary:  Negative for dysuria.  Musculoskeletal:  Negative for back pain and neck pain.  Skin:  Negative for rash.  Neurological:  Negative for dizziness, speech difficulty, weakness, numbness and headaches.  Hematological:  Negative for adenopathy. Does not bruise/bleed easily.  Psychiatric/Behavioral:  Negative for behavioral problems, decreased concentration and hallucinations. The patient is not nervous/anxious and is not hyperactive.     Past Medical History:  Diagnosis Date   Anxiety    COPD (chronic obstructive pulmonary disease) (HCC)    Hyperlipidemia    Hypertension      Social History   Socioeconomic History   Marital status: Single    Spouse name: Not on file   Number of children: Not on file   Years of education: Not on file   Highest education level: Not on file  Occupational History   Not on file  Tobacco Use   Smoking status: Former    Types: Cigarettes    Quit date: 11/25/2013    Years  since quitting: 8.5   Smokeless tobacco: Never  Vaping Use   Vaping Use: Never used  Substance and Sexual Activity   Alcohol use: Yes    Alcohol/week: 0.0 standard drinks of alcohol    Comment: occ   Drug use: Not Currently   Sexual activity: Not on file  Other Topics Concern   Not on file  Social History Narrative   Not on file   Social Determinants of Health   Financial Resource Strain: Not on file  Food Insecurity: Not on file  Transportation Needs: Not on file  Physical Activity: Not on file  Stress: Not on file  Social Connections: Not on file  Intimate Partner Violence: Not on file    Past Surgical History:  Procedure Laterality Date   Leap     TUBAL LIGATION      Family History  Problem Relation Age of Onset   Hyperlipidemia Mother    Hypertension Mother    Colon cancer Neg Hx    Colon polyps Neg Hx    Esophageal cancer Neg Hx    Rectal cancer Neg Hx    Stomach cancer Neg Hx     No Known Allergies  Current Outpatient Medications on File Prior to Visit  Medication Sig Dispense Refill   ALPRAZolam (XANAX) 0.25 MG tablet TAKE 1 TABLET BY MOUTH EVERY  DAY AS NEEDED FOR ANXIETY 30 tablet 3   ALPRAZolam (XANAX) 0.5 MG tablet 1 tab po q day prn anxiety 30 tablet 1   atorvastatin (LIPITOR) 20 MG tablet Take 1 tablet (20 mg total) by mouth daily. 90 tablet 3   clindamycin (CLINDAGEL) 1 % gel Apply topically 2 (two) times daily. 30 g 0   fluticasone (FLONASE) 50 MCG/ACT nasal spray Place 2 sprays into both nostrils daily. 16 g 1   hydrochlorothiazide (MICROZIDE) 12.5 MG capsule TAKE 1 CAPSULE BY MOUTH EVERY DAY 90 capsule 1   levocetirizine (XYZAL) 5 MG tablet Take 1 tablet (5 mg total) by mouth every evening. 30 tablet 3   levonorgestrel (MIRENA) 20 MCG/24HR IUD Mirena 20 mcg/24 hours (5 yrs) 52 mg intrauterine device  Take 1 device by intrauterine route.     omeprazole (PRILOSEC) 40 MG capsule TAKE 1 CAPSULE(40 MG) BY MOUTH DAILY 30 capsule 11   ondansetron  (ZOFRAN ODT) 4 MG disintegrating tablet Take 1 tablet (4 mg total) by mouth every 8 (eight) hours as needed for nausea or vomiting. 20 tablet 0   topiramate (TOPAMAX) 25 MG tablet 1 tab po  twice daily 60 tablet 3   venlafaxine XR (EFFEXOR-XR) 75 MG 24 hr capsule TAKE 1 CAPSULE (75 MG TOTAL) BY MOUTH DAILY WITH BREAKFAST. 30 capsule 5   No current facility-administered medications on file prior to visit.    BP (!) 150/90   Pulse (!) 101   Resp 18   Ht 5\' 5"  (1.651 m)   Wt 203 lb 9.6 oz (92.4 kg)   SpO2 98%   BMI 33.88 kg/m        Objective:   Physical Exam  General Mental Status- Alert. General Appearance- Not in acute distress.   Skin General: Color- Normal Color. Moisture- Normal Moisture.  Neck Carotid Arteries- Normal color. Moisture- Normal Moisture. No carotid bruits. No JVD.  Chest and Lung Exam Auscultation: Breath Sounds:-Normal.  Cardiovascular Auscultation:Rythm- Regular. Murmurs & Other Heart Sounds:Auscultation of the heart reveals- No Murmurs.  Abdomen Inspection:-Inspeection Normal. Palpation/Percussion:Note:No mass. Palpation and Percussion of the abdomen reveal- Non Tender, Non Distended + BS, no rebound or guarding.   Neurologic Cranial Nerve exam:- CN III-XII intact(No nystagmus), symmetric smile. Strength:- 5/5 equal and symmetric strength both upper and lower extremities.       Assessment & Plan:   Patient Instructions  Htn- bp is high today but not on the azor. I refilled that today.   Genella Rife- controlled with omeprazole. Refilling today.   Migraine- ha conrolled with imtrex and topamax combo. Fmla paperwork filled out.  For insominia- can continue benadryl as you are using. If on occasion does not work can add on melatonin 5 mg at night.   Follow up 2 weeks bp check nurse visit.       Esperanza Richters, PA-C

## 2022-06-17 ENCOUNTER — Ambulatory Visit: Payer: BC Managed Care – PPO

## 2022-06-18 ENCOUNTER — Ambulatory Visit: Payer: BC Managed Care – PPO

## 2022-06-18 NOTE — Progress Notes (Deleted)
Pt here for Blood pressure check per Mackie Pai, PA-C: " 05/28/22 Htn- bp is high today but not on the azor. I refilled that today., Follow up 2 weeks bp check nurse visit."  Pt currently takes: Azor 10-40 mg daily.   Pt reports compliance with medication.  BP today @ = HR =  Pt advised per Dr Nani Ravens (DOD):

## 2022-07-30 ENCOUNTER — Encounter: Payer: BC Managed Care – PPO | Admitting: Medical

## 2022-08-13 ENCOUNTER — Encounter: Payer: Self-pay | Admitting: Medical

## 2022-08-13 ENCOUNTER — Ambulatory Visit (INDEPENDENT_AMBULATORY_CARE_PROVIDER_SITE_OTHER): Payer: BC Managed Care – PPO | Admitting: Medical

## 2022-08-13 VITALS — BP 139/85 | HR 50 | Resp 18 | Ht 65.0 in | Wt 208.0 lb

## 2022-08-13 DIAGNOSIS — Z0000 Encounter for general adult medical examination without abnormal findings: Secondary | ICD-10-CM | POA: Diagnosis not present

## 2022-08-13 DIAGNOSIS — Z1231 Encounter for screening mammogram for malignant neoplasm of breast: Secondary | ICD-10-CM

## 2022-08-13 DIAGNOSIS — F419 Anxiety disorder, unspecified: Secondary | ICD-10-CM

## 2022-08-13 DIAGNOSIS — I10 Essential (primary) hypertension: Secondary | ICD-10-CM

## 2022-08-13 DIAGNOSIS — F3289 Other specified depressive episodes: Secondary | ICD-10-CM

## 2022-08-13 MED ORDER — HYDROXYZINE HCL 10 MG PO TABS: ORAL_TABLET | ORAL | 0 refills | Status: DC

## 2022-08-13 MED ORDER — BUPROPION HCL ER (XL) 150 MG PO TB24: 150.0000 mg | ORAL_TABLET | Freq: Every day | ORAL | 1 refills | Status: DC

## 2022-08-13 NOTE — Patient Instructions (Addendum)
For you wellness exam today I have ordered cbc, cmp and  lipid panel.  Flu vaccine declined. Can get later if you want to do on nurse visit.  Mammogram order placed.  Recommend exercise and healthy diet.  We will let you know lab results as they come in.  Follow up date appointment will be determined after lab review.    For htn- continue current bp medication.   For anxiety and insomnia- rx hydroxyzine. Rx advisement  For depression rx wellbutrin. Rx advisement.   Follow  up for mood in one month or sooner if needed.  Preventive Care 46-29 Years Old, Female Preventive care refers to lifestyle choices and visits with your health care provider that can promote health and wellness. Preventive care visits are also called wellness exams. What can I expect for my preventive care visit? Counseling Your health care provider may ask you questions about your: Medical history, including: Past medical problems. Family medical history. Pregnancy history. Current health, including: Menstrual cycle. Method of birth control. Emotional well-being. Home life and relationship well-being. Sexual activity and sexual health. Lifestyle, including: Alcohol, nicotine or tobacco, and drug use. Access to firearms. Diet, exercise, and sleep habits. Work and work Statistician. Sunscreen use. Safety issues such as seatbelt and bike helmet use. Physical exam Your health care provider will check your: Height and weight. These may be used to calculate your BMI (body mass index). BMI is a measurement that tells if you are at a healthy weight. Waist circumference. This measures the distance around your waistline. This measurement also tells if you are at a healthy weight and may help predict your risk of certain diseases, such as type 2 diabetes and high blood pressure. Heart rate and blood pressure. Body temperature. Skin for abnormal spots. What immunizations do I need?  Vaccines are usually given  at various ages, according to a schedule. Your health care provider will recommend vaccines for you based on your age, medical history, and lifestyle or other factors, such as travel or where you work. What tests do I need? Screening Your health care provider may recommend screening tests for certain conditions. This may include: Lipid and cholesterol levels. Diabetes screening. This is done by checking your blood sugar (glucose) after you have not eaten for a while (fasting). Pelvic exam and Pap test. Hepatitis B test. Hepatitis C test. HIV (human immunodeficiency virus) test. STI (sexually transmitted infection) testing, if you are at risk. Lung cancer screening. Colorectal cancer screening. Mammogram. Talk with your health care provider about when you should start having regular mammograms. This may depend on whether you have a family history of breast cancer. BRCA-related cancer screening. This may be done if you have a family history of breast, ovarian, tubal, or peritoneal cancers. Bone density scan. This is done to screen for osteoporosis. Talk with your health care provider about your test results, treatment options, and if necessary, the need for more tests. Follow these instructions at home: Eating and drinking  Eat a diet that includes fresh fruits and vegetables, whole grains, lean protein, and low-fat dairy products. Take vitamin and mineral supplements as recommended by your health care provider. Do not drink alcohol if: Your health care provider tells you not to drink. You are pregnant, may be pregnant, or are planning to become pregnant. If you drink alcohol: Limit how much you have to 0-1 drink a day. Know how much alcohol is in your drink. In the [place] one drink equals one 12 oz bottle  of beer (355 mL), one 5 oz glass of wine (148 mL), or one 1 oz glass of hard liquor (44 mL). Lifestyle Brush your teeth every morning and night with fluoride toothpaste. Floss one time  each day. Exercise for at least 30 minutes 5 or more days each week. Do not use any products that contain nicotine or tobacco. These products include cigarettes, chewing tobacco, and vaping devices, such as e-cigarettes. If you need help quitting, ask your health care provider. Do not use drugs. If you are sexually active, practice safe sex. Use a condom or other form of protection to prevent STIs. If you do not wish to become pregnant, use a form of birth control. If you plan to become pregnant, see your health care provider for a prepregnancy visit. Take aspirin only as told by your health care provider. Make sure that you understand how much to take and what form to take. Work with your health care provider to find out whether it is safe and beneficial for you to take aspirin daily. Find healthy ways to manage stress, such as: Meditation, yoga, or listening to music. Journaling. Talking to a trusted person. Spending time with friends and family. Minimize exposure to UV radiation to reduce your risk of skin cancer. Safety Always wear your seat belt while driving or riding in a vehicle. Do not drive: If you have been drinking alcohol. Do not ride with someone who has been drinking. When you are tired or distracted. While texting. If you have been using any mind-altering substances or drugs. Wear a helmet and other protective equipment during sports activities. If you have firearms in your house, make sure you follow all gun safety procedures. Seek help if you have been physically or sexually abused. What's next? Visit your health care provider once a year for an annual wellness visit. Ask your health care provider how often you should have your eyes and teeth checked. Stay up to date on all vaccines. This information is not intended to replace advice given to you by your health care provider. Make sure you discuss any questions you have with your health care provider. Document Revised:  04/08/2021 Document Reviewed: 04/08/2021 Elsevier Patient Education  [employer].

## 2022-08-13 NOTE — Addendum Note (Signed)
Addended by: Anabel Halon on: 08/13/2022 03:52 PM   Modules accepted: Orders

## 2022-08-13 NOTE — Progress Notes (Signed)
Subjective:    Patient ID: Katie Merritt, female    DOB: Mar 19, 1971, 51 y.o.   MRN: [id number]  HPI  Pt in for wellness exam.   Working for [employer] at home. Pt states more exercise more weight goes up. Pt states she is not a big eater. Nonsmoker. Alcohol- 2 bottles of wine a week.  Up to date on colonoscopy.  Pt is due for mammogram. Placed order today.  Papsmear- up to date.  Pt declines flu vaccine.  Pt bp mild high today. Pt is on azor 10-40 1 tab daily.  In the past pt used saxenda for wt loss but it made her nausea after using for 3 months. But no issues such as pancreatitis.  Has depression and anxiety. States both are bout equal.   Review of Systems  Constitutional:  Negative for chills, fatigue and fever.  HENT:  Negative for congestion, drooling and ear discharge.   Respiratory:  Negative for cough, chest tightness, shortness of breath and wheezing.   Cardiovascular:  Negative for chest pain and palpitations.  Gastrointestinal:  Negative for abdominal pain, anal bleeding, blood in stool and diarrhea.  Genitourinary:  Negative for dyspareunia, enuresis, frequency and hematuria.  Musculoskeletal:  Negative for back pain and joint swelling.  Skin:  Negative for rash.  Neurological:  Negative for dizziness, speech difficulty, weakness, numbness and headaches.  Hematological:  Negative for adenopathy. Does not bruise/bleed easily.  Psychiatric/Behavioral:  Positive for dysphoric mood and sleep disturbance. Negative for behavioral problems and decreased concentration. The patient is nervous/anxious.      Past Medical History:  Diagnosis Date   Anxiety    COPD (chronic obstructive pulmonary disease) (HCC)    Hyperlipidemia    Hypertension      Social History   Socioeconomic History   Marital status: Single    Spouse name: Not on file   Number of children: Not on file   Years of education: Not on file   Highest education level: Not on file  Occupational  History   Not on file  Tobacco Use   Smoking status: Former    Types: Cigarettes    Quit date: 11/25/2013    Years since quitting: 8.7   Smokeless tobacco: Never  Vaping Use   Vaping Use: Never used  Substance and Sexual Activity   Alcohol use: Yes    Alcohol/week: 0.0 standard drinks of alcohol    Comment: occ   Drug use: Not Currently   Sexual activity: Not on file  Other Topics Concern   Not on file  Social History Narrative   Not on file   Social Determinants of Health   Financial Resource Strain: Not on file  Food Insecurity: Not on file  Transportation Needs: Not on file  Physical Activity: Not on file  Stress: Not on file  Social Connections: Not on file  Intimate Partner Violence: Not on file    Past Surgical History:  Procedure Laterality Date   Leap     TUBAL LIGATION      Family History  Problem Relation Age of Onset   Hyperlipidemia Mother    Hypertension Mother    Colon cancer Neg Hx    Colon polyps Neg Hx    Esophageal cancer Neg Hx    Rectal cancer Neg Hx    Stomach cancer Neg Hx     No Known Allergies  Current Outpatient Medications on File Prior to Visit  Medication Sig Dispense Refill   ALPRAZolam (  XANAX) 0.25 MG tablet TAKE 1 TABLET BY MOUTH EVERY DAY AS NEEDED FOR ANXIETY 30 tablet 3   ALPRAZolam (XANAX) 0.5 MG tablet 1 tab po q day prn anxiety 30 tablet 1   amLODipine-olmesartan (AZOR) 10-40 MG tablet Take 1 tablet by mouth daily. 90 tablet 1   atorvastatin (LIPITOR) 20 MG tablet Take 1 tablet (20 mg total) by mouth daily. 90 tablet 3   clindamycin (CLINDAGEL) 1 % gel Apply topically 2 (two) times daily. 30 g 0   fluticasone (FLONASE) 50 MCG/ACT nasal spray Place 2 sprays into both nostrils daily. 16 g 1   hydrochlorothiazide (MICROZIDE) 12.5 MG capsule TAKE 1 CAPSULE BY MOUTH EVERY DAY 90 capsule 1   levocetirizine (XYZAL) 5 MG tablet Take 1 tablet (5 mg total) by mouth every evening. 30 tablet 3   levonorgestrel (MIRENA) 20 MCG/24HR  IUD Mirena 20 mcg/24 hours (5 yrs) 52 mg intrauterine device  Take 1 device by intrauterine route.     omeprazole (PRILOSEC) 40 MG capsule TAKE 1 CAPSULE(40 MG) BY MOUTH DAILY 30 capsule 11   omeprazole (PRILOSEC) 40 MG capsule TAKE 1 CAPSULE(40 MG) BY MOUTH DAILY 30 capsule 11   ondansetron (ZOFRAN ODT) 4 MG disintegrating tablet Take 1 tablet (4 mg total) by mouth every 8 (eight) hours as needed for nausea or vomiting. 20 tablet 0   SUMAtriptan (IMITREX) 50 MG tablet Take 1 tablet (50 mg total) by mouth every 2 (two) hours as needed for migraine. May repeat in 2 hours if headache persists or recurs. 10 tablet 11   topiramate (TOPAMAX) 25 MG tablet 1 tab po  twice daily 60 tablet 3   venlafaxine XR (EFFEXOR-XR) 75 MG 24 hr capsule TAKE 1 CAPSULE (75 MG TOTAL) BY MOUTH DAILY WITH BREAKFAST. 30 capsule 5   No current facility-administered medications on file prior to visit.    BP 139/85   Pulse (!) 50   Resp 18   Ht 5\' 5"  (1.651 m)   Wt 208 lb (94.3 kg)   SpO2 98%   BMI 34.61 kg/m           Objective:   Physical Exam  General Mental Status- Alert. General Appearance- Not in acute distress.   Skin General: Color- Normal Color. Moisture- Normal Moisture.  Neck Carotid Arteries- Normal color. Moisture- Normal Moisture. No carotid bruits. No JVD.  Chest and Lung Exam Auscultation: Breath Sounds:-Normal.  Cardiovascular Auscultation:Rythm- Regular. Murmurs & Other Heart Sounds:Auscultation of the heart reveals- No Murmurs.  Abdomen Inspection:-Inspeection Normal. Palpation/Percussion:Note:No mass. Palpation and Percussion of the abdomen reveal- Non Tender, Non Distended + BS, no rebound or guarding.  Neurologic Cranial Nerve exam:- CN III-XII intact(No nystagmus), symmetric smile. Strength:- 5/5 equal and symmetric strength both upper and lower extremities.        Assessment & Plan:   Patient Instructions  For you wellness exam today I have ordered cbc, cmp and   lipid panel.  Flu vaccine declined. Can get later if you want to do on nurse visit.  Recommend exercise and healthy diet.  We will let you know lab results as they come in.  Follow up date appointment will be determined after lab review.    For htn- continue current bp medication.   For anxiety and insomnia- rx hydroxyzine. Rx advisement  For depression rx wellbutrin. Rx advisement.       Esperanza Richters, [place]   [id number] charge in addition to welness. Exam. Addressed anxiety, depression and htn. Rx for wellbutrin  and hydroxyzine today.

## 2022-08-14 LAB — COMPREHENSIVE METABOLIC PANEL
AG Ratio: 1.5 (calc) (ref 1.0–2.5)
ALT: 16 U/L (ref 6–29)
AST: 20 U/L (ref 10–35)
Albumin: 4.3 g/dL (ref 3.6–5.1)
Alkaline phosphatase (APISO): 146 U/L (ref 37–153)
BUN/Creatinine Ratio: 12 (calc) (ref 6–22)
BUN: 16 mg/dL (ref 7–25)
CO2: 23 mmol/L (ref 20–32)
Calcium: 9.4 mg/dL (ref 8.6–10.4)
Chloride: 102 mmol/L (ref 98–110)
Creat: 1.29 mg/dL — ABNORMAL HIGH (ref 0.50–1.03)
Globulin: 2.9 g/dL (calc) (ref 1.9–3.7)
Glucose, Bld: 105 mg/dL — ABNORMAL HIGH (ref 65–99)
Potassium: 4.2 mmol/L (ref 3.5–5.3)
Sodium: 137 mmol/L (ref 135–146)
Total Bilirubin: 0.3 mg/dL (ref 0.2–1.2)
Total Protein: 7.2 g/dL (ref 6.1–8.1)

## 2022-08-14 LAB — CBC WITH DIFFERENTIAL/PLATELET
Absolute Monocytes: 485 cells/uL (ref 200–950)
Basophils Absolute: 86 cells/uL (ref 0–200)
Basophils Relative: 0.9 %
Eosinophils Absolute: 428 cells/uL (ref 15–500)
Eosinophils Relative: 4.5 %
HCT: 38 % (ref 35.0–45.0)
Hemoglobin: 12.1 g/dL (ref 11.7–15.5)
Lymphs Abs: 1520 cells/uL (ref 850–3900)
MCH: 26.5 pg — ABNORMAL LOW (ref 27.0–33.0)
MCHC: 31.8 g/dL — ABNORMAL LOW (ref 32.0–36.0)
MCV: 83.2 fL (ref 80.0–100.0)
MPV: 11.9 fL (ref 7.5–12.5)
Monocytes Relative: 5.1 %
Neutro Abs: 6983 cells/uL (ref 1500–7800)
Neutrophils Relative %: 73.5 %
Platelets: 437 10*3/uL — ABNORMAL HIGH (ref 140–400)
RBC: 4.57 10*6/uL (ref 3.80–5.10)
RDW: 16 % — ABNORMAL HIGH (ref 11.0–15.0)
Total Lymphocyte: 16 %
WBC: 9.5 10*3/uL (ref 3.8–10.8)

## 2022-08-14 LAB — LIPID PANEL
Cholesterol: 262 mg/dL — ABNORMAL HIGH (ref <200)
HDL: 46 mg/dL — ABNORMAL LOW (ref >=50)
Non-HDL Cholesterol (Calc): 216 mg/dL (calc) — ABNORMAL HIGH (ref <130)
Total CHOL/HDL Ratio: 5.7 (calc) — ABNORMAL HIGH (ref <5.0)
Triglycerides: 449 mg/dL — ABNORMAL HIGH (ref <150)

## 2022-08-15 MED ORDER — ATORVASTATIN CALCIUM 10 MG PO TABS: 10.0000 mg | ORAL_TABLET | Freq: Every day | ORAL | 3 refills | Status: DC

## 2022-08-15 NOTE — Addendum Note (Signed)
Addended by: Anabel Halon on: 08/15/2022 02:48 PM   Modules accepted: Orders

## 2022-08-17 ENCOUNTER — Encounter: Payer: Self-pay | Admitting: Medical

## 2022-08-17 MED ORDER — ATORVASTATIN CALCIUM 20 MG PO TABS: 20.0000 mg | ORAL_TABLET | Freq: Every day | ORAL | 3 refills | Status: DC

## 2022-08-17 NOTE — Addendum Note (Signed)
Addended by: Anabel Halon on: 08/17/2022 12:58 PM   Modules accepted: Orders

## 2022-09-02 ENCOUNTER — Telehealth (HOSPITAL_BASED_OUTPATIENT_CLINIC_OR_DEPARTMENT_OTHER): Payer: Self-pay

## 2022-09-07 ENCOUNTER — Encounter: Payer: Self-pay | Admitting: Medical

## 2022-09-09 NOTE — Telephone Encounter (Signed)
Pt has a VV scheduled for 09/14/2022

## 2022-09-14 ENCOUNTER — Telehealth (INDEPENDENT_AMBULATORY_CARE_PROVIDER_SITE_OTHER): Payer: BC Managed Care – PPO | Admitting: Medical

## 2022-09-14 DIAGNOSIS — F419 Anxiety disorder, unspecified: Secondary | ICD-10-CM

## 2022-09-14 DIAGNOSIS — I10 Essential (primary) hypertension: Secondary | ICD-10-CM

## 2022-09-14 DIAGNOSIS — F3289 Other specified depressive episodes: Secondary | ICD-10-CM | POA: Diagnosis not present

## 2022-09-14 MED ORDER — ESCITALOPRAM OXALATE 10 MG PO TABS: 10.0000 mg | ORAL_TABLET | Freq: Every day | ORAL | 0 refills | Status: DC

## 2022-09-14 MED ORDER — AMLODIPINE BESYLATE 10 MG PO TABS: 10.0000 mg | ORAL_TABLET | Freq: Every day | ORAL | 0 refills | Status: DC

## 2022-09-14 MED ORDER — OLMESARTAN MEDOXOMIL 40 MG PO TABS: 40.0000 mg | ORAL_TABLET | Freq: Every day | ORAL | 0 refills | Status: DC

## 2022-09-14 MED ORDER — BUSPIRONE HCL 15 MG PO TABS: 15.0000 mg | ORAL_TABLET | Freq: Two times a day (BID) | ORAL | 0 refills | Status: DC

## 2022-09-14 NOTE — Progress Notes (Addendum)
Subjective:    Patient ID: Katie Merritt, female    DOB: 09-13-1971, 51 y.o.   MRN: [id number]  HPI   Virtual Visit via Video Note  I connected with Katie Merritt on 09/20/22 at  1:20 PM EST by a video enabled telemedicine application and verified that I am speaking with the correct person using two identifiers.  Location: Patient: home Provider: home   I discussed the limitations of evaluation and management by telemedicine and the availability of in person appointments. The patient expressed understanding and agreed to proceed.  History of Present Illness: Pt has history of depression and anxiety. Pt states she feel more despressed than anxiety. But some of both. Pt had been on wellbutrin. She does not feel much better. She states even doubled up on dose of wellbutrin 150 xl 2 tab daily.  On review pt states venlafaxine made her feel loopy and she got dry mouth.  On review was on buspar 7.5 mg twice daily in 2016. On review no adverse side effect but did not help adquate for anxiety.  Htn- pt did not check bp today. Checked 165/119. Second 162/112. She has not had med for 3 days. She states too expensive cost $30 dolars and use to be free.   Observations/Objective: General-no acute distress, pleasant, oriented. Lungs- on inspection lungs appear unlabored. Neck- no tracheal deviation or jvd on inspection. Neuro- gross motor function appears intact.   Assessment and Plan: Depression and anxiety- Recent failed wellbutrin. Stop and switch to lexapro 10 mg daily. This may work for mood and anxiety. If needed can add on buspar 15 mg twice daily in 2 weeks.  Htn- bp was high today at home. Off med for 3 days. Rx of amlodipine and olmesartan separate as combined tab may make it more expensive. Check bp over next week and update me on bp readings.  Bp was very high so want to see  bp drop but if not or cardio/neurologic then be seen in ED.  Follow up in 3 weeks virtual visit to  see how your mood is doing. Sooner if needed.    Follow Up Instructions:    I discussed the assessment and treatment plan with the patient. The patient was provided an opportunity to ask questions and all were answered. The patient agreed with the plan and demonstrated an understanding of the instructions.   The patient was advised to call back or seek an in-person evaluation if the symptoms worsen or if the condition fails to improve as anticipated.  Esperanza Richters, PA-C    [employer], PA-C    Review of Systems  Constitutional:  Negative for chills, fatigue and fever.  Respiratory:  Negative for cough, chest tightness, shortness of breath and wheezing.   Cardiovascular:  Negative for palpitations.  Gastrointestinal:  Negative for abdominal pain.  Musculoskeletal:  Negative for back pain.  Neurological:  Negative for dizziness, speech difficulty, light-headedness and numbness.  Hematological:  Negative for adenopathy. Does not bruise/bleed easily.  Psychiatric/Behavioral:  Positive for dysphoric mood. Negative for behavioral problems and suicidal ideas. The patient is nervous/anxious.          Objective:   Physical Exam        Assessment & Plan:   Patient Instructions  Depression and anxiety- Recent failed wellbutrin. Stop and switch to lexapro 10 mg daily. This may work for mood and anxiety. If needed can add on buspar 15 mg twice daily in 2 weeks.  Htn- bp  was high today at home. Off med for 3 days. Rx of amlodipine and olmesartan separate as combined tab may make it more expensive. Check bp over next week and update me on bp readings.  Follow up in 3 weeks virtual visit to see how your mood is doing. Sooner if needed.  Esperanza Richters, PA-C

## 2022-09-14 NOTE — Patient Instructions (Addendum)
Depression and anxiety- Recent failed wellbutrin. Stop and switch to lexapro 10 mg daily. This may work for mood and anxiety. If needed can add on buspar 15 mg twice daily in 2 weeks.  Htn- bp was high today at home. Off med for 3 days. Rx of amlodipine and olmesartan separate as combined tab may make it more expensive. Check bp over next week and update me on bp readings.  Bp was very high so want to see  bp drop but if not or cardio/neurologic then be seen in ED.  Follow up in 3 weeks virtual visit to see how your mood is doing. Sooner if needed.

## 2022-10-31 ENCOUNTER — Encounter: Payer: Self-pay | Admitting: Medical

## 2022-10-31 ENCOUNTER — Other Ambulatory Visit: Payer: Self-pay | Admitting: Medical

## 2022-11-05 ENCOUNTER — Ambulatory Visit: Payer: BC Managed Care – PPO | Admitting: Medical

## 2023-01-18 ENCOUNTER — Ambulatory Visit: Payer: BC Managed Care – PPO | Admitting: Medical

## 2023-01-19 ENCOUNTER — Encounter: Payer: Self-pay | Admitting: Medical

## 2023-01-19 NOTE — Telephone Encounter (Signed)
Paper printed for appt 01/25/23

## 2023-01-25 ENCOUNTER — Ambulatory Visit (INDEPENDENT_AMBULATORY_CARE_PROVIDER_SITE_OTHER): Payer: BC Managed Care – PPO | Admitting: Medical

## 2023-01-25 ENCOUNTER — Other Ambulatory Visit: Payer: Self-pay | Admitting: Medical

## 2023-01-25 ENCOUNTER — Encounter: Payer: Self-pay | Admitting: Medical

## 2023-01-25 VITALS — BP 145/90 | HR 100 | Temp 98.0°F | Resp 18 | Ht 65.0 in | Wt 207.0 lb

## 2023-01-25 DIAGNOSIS — R739 Hyperglycemia, unspecified: Secondary | ICD-10-CM

## 2023-01-25 DIAGNOSIS — I10 Essential (primary) hypertension: Secondary | ICD-10-CM

## 2023-01-25 DIAGNOSIS — G43809 Other migraine, not intractable, without status migrainosus: Secondary | ICD-10-CM | POA: Diagnosis not present

## 2023-01-25 DIAGNOSIS — F419 Anxiety disorder, unspecified: Secondary | ICD-10-CM

## 2023-01-25 DIAGNOSIS — E669 Obesity, unspecified: Secondary | ICD-10-CM

## 2023-01-25 MED ORDER — BUSPIRONE HCL 15 MG PO TABS: 15.0000 mg | ORAL_TABLET | Freq: Two times a day (BID) | ORAL | 1 refills | Status: DC

## 2023-01-25 MED ORDER — ESCITALOPRAM OXALATE 10 MG PO TABS: 10.0000 mg | ORAL_TABLET | Freq: Every day | ORAL | 1 refills | Status: DC

## 2023-01-25 NOTE — Patient Instructions (Addendum)
1. Hypertension, unspecified type Bp high today but not on med today. Take med daily. Update me tomorrow on your bp reading with med in system - Comp Met (CMET)  2. Elevated blood sugar(with obesity and desire to lose wt) Check average sugar. If in diabetic range rx ozempic or if not diabetic wegovy. - Hemoglobin A1c  3. Anxiety Controlled with buspar.  4. Other migraine without status migrainosus, not intractable Controlled on imitrex. Stops ha quickly when taken early on.  Filling out fmla form today.  Cmp and A1c today.  Follow update to be determined after lab review.

## 2023-01-25 NOTE — Progress Notes (Signed)
Subjective:    Patient ID: Katie Merritt, female    DOB: [birth year], 52 y.o.   MRN: [id number]  HPI  Migraine about 2-3 times a month. Pt takes sumpitraptian. If takes early enough will stop ha. Pt has not had to take topamax. Needs fmla form filled out.  Htn- pt is on benicar and hctz.  Obesity- pt states in past saxenda helped but at high dosage made her nausea.  Anxiety- doing well with buspar 15 mg twice a day.  Elevated sugar- will get A1c      Review of Systems  Constitutional:  Negative for chills, fatigue and fever.  HENT:  Negative for congestion, drooling, ear discharge, facial swelling and mouth sores.   Respiratory:  Negative for cough, chest tightness, shortness of breath and wheezing.   Cardiovascular:  Negative for chest pain and palpitations.  Gastrointestinal:  Negative for abdominal pain.  Musculoskeletal:  Negative for back pain and joint swelling.  Neurological:  Positive for headaches. Negative for dizziness, syncope, weakness and numbness.       Migraines controlled.  Hematological:  Negative for adenopathy. Does not bruise/bleed easily.  Psychiatric/Behavioral:  Negative for behavioral problems and confusion. The patient is nervous/anxious.     Past Medical History:  Diagnosis Date   Anxiety    COPD (chronic obstructive pulmonary disease)    Hyperlipidemia    Hypertension      Social History   Socioeconomic History   Marital status: Single    Spouse name: Not on file   Number of children: Not on file   Years of education: Not on file   Highest education level: Associate degree: occupational, Scientist, product/process development, or vocational program  Occupational History   Not on file  Tobacco Use   Smoking status: Former    Types: Cigarettes    Quit date: 11/25/2013    Years since quitting: 9.1   Smokeless tobacco: Never  Vaping Use   Vaping Use: Never used  Substance and Sexual Activity   Alcohol use: Yes    Alcohol/week: 0.0 standard drinks of alcohol     Comment: occ   Drug use: Not Currently   Sexual activity: Not on file  Other Topics Concern   Not on file  Social History Narrative   Not on file   Social Determinants of Health   Financial Resource Strain: Low Risk  (01/24/2023)   Overall Financial Resource Strain (CARDIA)    Difficulty of Paying Living Expenses: Not very hard  Food Insecurity: Food Insecurity Present (01/24/2023)   Hunger Vital Sign    Worried About Running Out of Food in the Last Year: Never true    Ran Out of Food in the Last Year: Sometimes true  Transportation Needs: No Transportation Needs (01/24/2023)   PRAPARE - Administrator, Civil Service (Medical): No    Lack of Transportation (Non-Medical): No  Physical Activity: Insufficiently Active (01/24/2023)   Exercise Vital Sign    Days of Exercise per Week: 2 days    Minutes of Exercise per Session: 30 min  Stress: Stress Concern Present (01/24/2023)   [employer] of Occupational Health - Occupational Stress Questionnaire    Feeling of Stress : Rather much  Social Connections: Moderately Isolated (01/24/2023)   Social Connection and Isolation Panel [NHANES]    Frequency of Communication with Friends and Family: More than three times a week    Frequency of Social Gatherings with Friends and Family: Once a week    Attends  Religious Services: 1 to 4 times per year    Active Member of Clubs or Organizations: No    Attends Banker Meetings: Not on file    Marital Status: Never married  Intimate Partner Violence: Not on file    Past Surgical History:  Procedure Laterality Date   Leap     TUBAL LIGATION      Family History  Problem Relation Age of Onset   Hyperlipidemia Mother    Hypertension Mother    Colon cancer Neg Hx    Colon polyps Neg Hx    Esophageal cancer Neg Hx    Rectal cancer Neg Hx    Stomach cancer Neg Hx     No Known Allergies  Current Outpatient Medications on File Prior to Visit  Medication Sig Dispense  Refill   ALPRAZolam (XANAX) 0.25 MG tablet TAKE 1 TABLET BY MOUTH EVERY DAY AS NEEDED FOR ANXIETY 30 tablet 3   ALPRAZolam (XANAX) 0.5 MG tablet 1 tab po q day prn anxiety 30 tablet 1   amLODipine (NORVASC) 10 MG tablet TAKE 1 TABLET(10 MG) BY MOUTH DAILY 30 tablet 0   atorvastatin (LIPITOR) 20 MG tablet Take 1 tablet (20 mg total) by mouth daily. 30 tablet 3   buPROPion (WELLBUTRIN XL) 150 MG 24 hr tablet TAKE 1 TABLET(150 MG) BY MOUTH DAILY 30 tablet 1   busPIRone (BUSPAR) 15 MG tablet Take 1 tablet (15 mg total) by mouth 2 (two) times daily. 60 tablet 0   clindamycin (CLINDAGEL) 1 % gel Apply topically 2 (two) times daily. 30 g 0   escitalopram (LEXAPRO) 10 MG tablet TAKE 1 TABLET(10 MG) BY MOUTH DAILY 30 tablet 0   fluticasone (FLONASE) 50 MCG/ACT nasal spray Place 2 sprays into both nostrils daily. 16 g 1   hydrochlorothiazide (MICROZIDE) 12.5 MG capsule TAKE 1 CAPSULE BY MOUTH EVERY DAY 90 capsule 1   hydrOXYzine (ATARAX) 10 MG tablet 1-2 tab po tid prn anxiety 30 tablet 0   levocetirizine (XYZAL) 5 MG tablet Take 1 tablet (5 mg total) by mouth every evening. 30 tablet 3   levonorgestrel (MIRENA) 20 MCG/24HR IUD Mirena 20 mcg/24 hours (5 yrs) 52 mg intrauterine device  Take 1 device by intrauterine route.     olmesartan (BENICAR) 40 MG tablet Take 1 tablet (40 mg total) by mouth daily. 30 tablet 0   omeprazole (PRILOSEC) 40 MG capsule TAKE 1 CAPSULE(40 MG) BY MOUTH DAILY 30 capsule 11   omeprazole (PRILOSEC) 40 MG capsule TAKE 1 CAPSULE(40 MG) BY MOUTH DAILY 30 capsule 11   ondansetron (ZOFRAN ODT) 4 MG disintegrating tablet Take 1 tablet (4 mg total) by mouth every 8 (eight) hours as needed for nausea or vomiting. 20 tablet 0   SUMAtriptan (IMITREX) 50 MG tablet Take 1 tablet (50 mg total) by mouth every 2 (two) hours as needed for migraine. May repeat in 2 hours if headache persists or recurs. 10 tablet 11   topiramate (TOPAMAX) 25 MG tablet 1 tab po  twice daily 60 tablet 3   No  current facility-administered medications on file prior to visit.    BP (!) 145/90   Pulse 100   Temp 98 F (36.7 C)   Resp 18   Ht 5\' 5"  (1.651 m)   Wt 207 lb (93.9 kg)   SpO2 99%   BMI 34.45 kg/m          Objective:   Physical Exam  General Mental Status- Alert. General Appearance- Not  in acute distress.   Skin General: Color- Normal Color. Moisture- Normal Moisture.  Neck Carotid Arteries- Normal color. Moisture- Normal Moisture. No carotid bruits. No JVD.  Chest and Lung Exam Auscultation: Breath Sounds:-Normal.  Cardiovascular Auscultation:Rythm- Regular. Murmurs & Other Heart Sounds:Auscultation of the heart reveals- No Murmurs.  Abdomen Inspection:-Inspeection Normal. Palpation/Percussion:Note:No mass. Palpation and Percussion of the abdomen reveal- Non Tender, Non Distended + BS, no rebound or guarding.    Neurologic Cranial Nerve exam:- CN III-XII intact(No nystagmus), symmetric smile. Strength:- 5/5 equal and symmetric strength both upper and lower extremities.       Assessment & Plan:  1. Hypertension, unspecified type Bp high today but not on med today. Take med daily. Update me tomorrow on your bp reading with med in system - Comp Met (CMET)  2. Elevated blood sugar(with obesity and desire to lose wt) Check average sugar. If in diabetic range rx ozempic or if not diabetic wegovy. - Hemoglobin A1c  3. Anxiety Controlled with buspar.  4. Other migraine without status migrainosus, not intractable Controlled on imitrex. Stops ha quickly when taken early on.  Filling out fmla form today.  Cmp and A1c today.  Follow update to be determined after lab review.   Esperanza Richters, PA-C

## 2023-01-26 LAB — COMPREHENSIVE METABOLIC PANEL
ALT: 11 U/L (ref 0–35)
AST: 13 U/L (ref 0–37)
Albumin: 4.3 g/dL (ref 3.5–5.2)
Alkaline Phosphatase: 135 U/L — ABNORMAL HIGH (ref 39–117)
BUN: 16 mg/dL (ref 6–23)
CO2: 26 mEq/L (ref 19–32)
Calcium: 8.9 mg/dL (ref 8.4–10.5)
Chloride: 103 mEq/L (ref 96–112)
Creatinine, Ser: 0.94 mg/dL (ref 0.40–1.20)
GFR: 70.59 mL/min (ref >60.00)
Glucose, Bld: 88 mg/dL (ref 70–99)
Potassium: 3.8 mEq/L (ref 3.5–5.1)
Sodium: 137 mEq/L (ref 135–145)
Total Bilirubin: 0.3 mg/dL (ref 0.2–1.2)
Total Protein: 6.8 g/dL (ref 6.0–8.3)

## 2023-01-26 LAB — HEMOGLOBIN A1C: Hgb A1c MFr Bld: 6.1 % (ref 4.6–6.5)

## 2023-01-27 ENCOUNTER — Encounter: Payer: Self-pay | Admitting: Medical

## 2023-01-27 MED ORDER — CHLORTHALIDONE 25 MG PO TABS: 25.0000 mg | ORAL_TABLET | Freq: Every day | ORAL | 3 refills | Status: DC

## 2023-01-27 NOTE — Addendum Note (Signed)
Addended by: Anabel Halon on: 01/27/2023 05:27 PM   Modules accepted: Orders

## 2023-01-27 NOTE — Addendum Note (Signed)
Addended by: Anabel Halon on: 01/27/2023 05:19 PM   Modules accepted: Orders

## 2023-01-28 ENCOUNTER — Telehealth: Payer: Self-pay

## 2023-01-28 MED ORDER — SEMAGLUTIDE-WEIGHT MANAGEMENT 0.25 MG/0.5ML ~~LOC~~ SOAJ: 0.2500 mg | SUBCUTANEOUS | 0 refills | Status: DC

## 2023-01-28 NOTE — Addendum Note (Signed)
Addended by: Gwenevere Abbot on: 01/28/2023 01:08 PM   Modules accepted: Orders

## 2023-01-28 NOTE — Telephone Encounter (Signed)
PA approved. Effective 01/28/2023 to 08/30/2023

## 2023-01-28 NOTE — Telephone Encounter (Signed)
PA initiated via Covermymeds; KEY: [id number]. Awaiting determination.

## 2023-02-22 ENCOUNTER — Other Ambulatory Visit: Payer: Self-pay | Admitting: Medical

## 2023-02-27 ENCOUNTER — Other Ambulatory Visit: Payer: Self-pay | Admitting: Medical

## 2023-02-28 ENCOUNTER — Encounter: Payer: Self-pay | Admitting: Medical

## 2023-02-28 MED ORDER — WEGOVY 0.5 MG/0.5ML ~~LOC~~ SOAJ: 0.5000 mg | SUBCUTANEOUS | 0 refills | Status: DC

## 2023-04-03 ENCOUNTER — Other Ambulatory Visit: Payer: Self-pay | Admitting: Medical

## 2023-04-15 ENCOUNTER — Encounter: Payer: Self-pay | Admitting: Medical

## 2023-04-15 MED ORDER — SEMAGLUTIDE-WEIGHT MANAGEMENT 1 MG/0.5ML ~~LOC~~ SOAJ: 1.0000 mg | SUBCUTANEOUS | 0 refills | Status: DC

## 2023-04-15 NOTE — Addendum Note (Signed)
Addended by: Gwenevere Abbot on: 04/15/2023 08:48 PM   Modules accepted: Orders

## 2023-04-27 DIAGNOSIS — H40013 Open angle with borderline findings, low risk, bilateral: Secondary | ICD-10-CM | POA: Diagnosis not present

## 2023-05-27 ENCOUNTER — Encounter: Payer: Self-pay | Admitting: Medical

## 2023-05-30 MED ORDER — WEGOVY 1.7 MG/0.75ML ~~LOC~~ SOAJ: 1.7000 mg | SUBCUTANEOUS | 0 refills | Status: DC

## 2023-05-30 NOTE — Addendum Note (Signed)
Addended by: Gwenevere Abbot on: 05/30/2023 12:08 PM   Modules accepted: Orders

## 2023-07-05 ENCOUNTER — Other Ambulatory Visit: Payer: Self-pay | Admitting: Medical

## 2023-07-25 ENCOUNTER — Encounter: Payer: Self-pay | Admitting: Medical

## 2023-07-28 ENCOUNTER — Encounter: Payer: Self-pay | Admitting: Medical

## 2023-07-28 ENCOUNTER — Other Ambulatory Visit: Payer: Self-pay | Admitting: Medical

## 2023-07-28 ENCOUNTER — Ambulatory Visit (INDEPENDENT_AMBULATORY_CARE_PROVIDER_SITE_OTHER): Payer: BC Managed Care – PPO | Admitting: Medical

## 2023-07-28 VITALS — BP 140/90 | HR 83 | Resp 18 | Ht 65.0 in | Wt 182.6 lb

## 2023-07-28 DIAGNOSIS — R944 Abnormal results of kidney function studies: Secondary | ICD-10-CM

## 2023-07-28 DIAGNOSIS — R739 Hyperglycemia, unspecified: Secondary | ICD-10-CM | POA: Diagnosis not present

## 2023-07-28 DIAGNOSIS — G43809 Other migraine, not intractable, without status migrainosus: Secondary | ICD-10-CM

## 2023-07-28 DIAGNOSIS — E66811 Obesity, class 1: Secondary | ICD-10-CM

## 2023-07-28 DIAGNOSIS — I10 Essential (primary) hypertension: Secondary | ICD-10-CM

## 2023-07-28 DIAGNOSIS — F419 Anxiety disorder, unspecified: Secondary | ICD-10-CM

## 2023-07-28 LAB — COMPREHENSIVE METABOLIC PANEL
ALT: 10 U/L (ref 0–35)
AST: 13 U/L (ref 0–37)
Albumin: 4.5 g/dL (ref 3.5–5.2)
Alkaline Phosphatase: 114 U/L (ref 39–117)
BUN: 17 mg/dL (ref 6–23)
CO2: 29 meq/L (ref 19–32)
Calcium: 9.6 mg/dL (ref 8.4–10.5)
Chloride: 100 meq/L (ref 96–112)
Creatinine, Ser: 1.13 mg/dL (ref 0.40–1.20)
GFR: 56.4 mL/min — ABNORMAL LOW (ref >60.00)
Glucose, Bld: 94 mg/dL (ref 70–99)
Potassium: 3.8 meq/L (ref 3.5–5.1)
Sodium: 137 meq/L (ref 135–145)
Total Bilirubin: 0.5 mg/dL (ref 0.2–1.2)
Total Protein: 7.4 g/dL (ref 6.0–8.3)

## 2023-07-28 LAB — HEMOGLOBIN A1C: Hgb A1c MFr Bld: 5.9 % (ref 4.6–6.5)

## 2023-07-28 NOTE — Patient Instructions (Addendum)
1. Elevated blood sugar Low sugar diet and will see if in diabetic range - Hemoglobin A1c - Comp Met (CMET)  2. Other migraine without status migrainosus, not intractable - continue sumitriptan for acute migraine and advise topamax for prevention.  3. Anxiety -well controlled with buspar.   4. Hypertension, unspecified type  -borderline controlled. Not on bp today. Please take med today.  5. For obesity -continue wegovy 1.7 mg weekly   Follow up date to be determined after lab review.

## 2023-07-28 NOTE — Progress Notes (Signed)
Subjective:    Patient ID: Katie Merritt, female    DOB: 02-02-1971, 52 y.o.   MRN: [id number]  HPI Pt in for follow up.  Pt has migraine ha.  Migraine about 2-3 times a month. Pt takes sumpitraptian. If takes early enough will stop ha. Pt has not had to take topamax though on discusssion she was not aware used for prevention. Needs fmla form filled out.   Htn- pt is on benicar and hctz.  However did not take med this morning.   Anxiety controlled well with buspar 15 mg twice daily.  Elevated sugar. Last A1c was in April.  Pt stats form due today. T thinks told her to get form back dated to 07-12-2023? This form states due on 07-28-2023   Review of Systems  Constitutional:  Negative for chills, fatigue and fever.  HENT:  Negative for congestion and drooling.   Respiratory:  Negative for chest tightness, shortness of breath and wheezing.   Cardiovascular:  Negative for chest pain and palpitations.  Gastrointestinal:  Negative for abdominal pain, blood in stool, diarrhea and rectal pain.  Genitourinary:  Negative for dysuria.  Musculoskeletal:  Negative for back pain and myalgias.  Skin:  Negative for rash.  Neurological:  Negative for dizziness, weakness and headaches.       No ha presently.  Hematological:  Negative for adenopathy. Does not bruise/bleed easily.  Psychiatric/Behavioral:  Negative for behavioral problems, decreased concentration and dysphoric mood.     Past Medical History:  Diagnosis Date   Anxiety    COPD (chronic obstructive pulmonary disease) (HCC)    Hyperlipidemia    Hypertension      Social History   Socioeconomic History   Marital status: Single    Spouse name: Not on file   Number of children: Not on file   Years of education: Not on file   Highest education level: Associate degree: occupational, Scientist, product/process development, or vocational program  Occupational History   Not on file  Tobacco Use   Smoking status: Former    Current packs/day: 0.00     Types: Cigarettes    Quit date: 11/25/2013    Years since quitting: 9.6   Smokeless tobacco: Never  Vaping Use   Vaping status: Never Used  Substance and Sexual Activity   Alcohol use: Yes    Alcohol/week: 0.0 standard drinks of alcohol    Comment: occ   Drug use: Not Currently   Sexual activity: Not on file  Other Topics Concern   Not on file  Social History Narrative   Not on file   Social Determinants of Health   Financial Resource Strain: Low Risk  (01/24/2023)   Overall Financial Resource Strain (CARDIA)    Difficulty of Paying Living Expenses: Not very hard  Food Insecurity: Food Insecurity Present (01/24/2023)   Hunger Vital Sign    Worried About Running Out of Food in the Last Year: Never true    Ran Out of Food in the Last Year: Sometimes true  Transportation Needs: No Transportation Needs (01/24/2023)   PRAPARE - Administrator, Civil Service (Medical): No    Lack of Transportation (Non-Medical): No  Physical Activity: Insufficiently Active (01/24/2023)   Exercise Vital Sign    Days of Exercise per Week: 2 days    Minutes of Exercise per Session: 30 min  Stress: Stress Concern Present (01/24/2023)   [employer] of Occupational Health - Occupational Stress Questionnaire    Feeling of Stress :  Rather much  Social Connections: Moderately Isolated (01/24/2023)   Social Connection and Isolation Panel [NHANES]    Frequency of Communication with Friends and Family: More than three times a week    Frequency of Social Gatherings with Friends and Family: Once a week    Attends Religious Services: 1 to 4 times per year    Active Member of [employer] or Organizations: No    Attends Engineer, structural: Not on file    Marital Status: Never married  Catering manager Violence: Not on file    Past Surgical History:  Procedure Laterality Date   Leap     TUBAL LIGATION      Family History  Problem Relation Age of Onset   Hyperlipidemia Mother    Hypertension  Mother    Colon cancer Neg Hx    Colon polyps Neg Hx    Esophageal cancer Neg Hx    Rectal cancer Neg Hx    Stomach cancer Neg Hx     No Known Allergies  Current Outpatient Medications on File Prior to Visit  Medication Sig Dispense Refill   ALPRAZolam (XANAX) 0.25 MG tablet TAKE 1 TABLET BY MOUTH EVERY DAY AS NEEDED FOR ANXIETY 30 tablet 3   ALPRAZolam (XANAX) 0.5 MG tablet 1 tab po q day prn anxiety 30 tablet 1   amLODipine (NORVASC) 10 MG tablet TAKE 1 TABLET(10 MG) BY MOUTH DAILY 30 tablet 0   atorvastatin (LIPITOR) 20 MG tablet Take 1 tablet (20 mg total) by mouth daily. 30 tablet 3   buPROPion (WELLBUTRIN XL) 150 MG 24 hr tablet TAKE 1 TABLET(150 MG) BY MOUTH DAILY 30 tablet 1   busPIRone (BUSPAR) 15 MG tablet Take 1 tablet (15 mg total) by mouth 2 (two) times daily. 180 tablet 1   chlorthalidone (HYGROTON) 25 MG tablet Take 1 tablet (25 mg total) by mouth daily. 90 tablet 3   clindamycin (CLINDAGEL) 1 % gel Apply topically 2 (two) times daily. 30 g 0   escitalopram (LEXAPRO) 10 MG tablet Take 1 tablet (10 mg total) by mouth daily. 90 tablet 1   fluticasone (FLONASE) 50 MCG/ACT nasal spray Place 2 sprays into both nostrils daily. 16 g 1   hydrOXYzine (ATARAX) 10 MG tablet 1-2 tab po tid prn anxiety 30 tablet 0   levocetirizine (XYZAL) 5 MG tablet Take 1 tablet (5 mg total) by mouth every evening. 30 tablet 3   levonorgestrel (MIRENA) 20 MCG/24HR IUD Mirena 20 mcg/24 hours (5 yrs) 52 mg intrauterine device  Take 1 device by intrauterine route.     olmesartan (BENICAR) 40 MG tablet Take 1 tablet (40 mg total) by mouth daily. 30 tablet 0   omeprazole (PRILOSEC) 40 MG capsule TAKE 1 CAPSULE(40 MG) BY MOUTH DAILY 30 capsule 11   omeprazole (PRILOSEC) 40 MG capsule TAKE 1 CAPSULE(40 MG) BY MOUTH DAILY 30 capsule 11   ondansetron (ZOFRAN ODT) 4 MG disintegrating tablet Take 1 tablet (4 mg total) by mouth every 8 (eight) hours as needed for nausea or vomiting. 20 tablet 0    Semaglutide-Weight Management (WEGOVY) 0.5 MG/0.5ML SOAJ Inject 0.5 mg into the skin once a week. 2 mL 0   Semaglutide-Weight Management (WEGOVY) 1.7 MG/0.75ML SOAJ Inject 1.7 mg into the skin once a week. 3 mL 0   Semaglutide-Weight Management 1 MG/0.5ML SOAJ Inject 1 mg into the skin once a week. 2 mL 0   SUMAtriptan (IMITREX) 50 MG tablet Take 1 tablet (50 mg total) by mouth every  2 (two) hours as needed for migraine. May repeat in 2 hours if headache persists or recurs. 10 tablet 11   topiramate (TOPAMAX) 25 MG tablet 1 tab po  twice daily 60 tablet 3   No current facility-administered medications on file prior to visit.    BP (!) 140/90   Pulse 83   Resp 18   Ht 5\' 5"  (1.651 m)   Wt 182 lb 9.6 oz (82.8 kg)   SpO2 100%   BMI 30.39 kg/m        Objective:   Physical Exam   General Mental Status- Alert. General Appearance- Not in acute distress.   Skin General: Color- Normal Color. Moisture- Normal Moisture.  Neck Carotid Arteries- Normal color. Moisture- Normal Moisture. No carotid bruits. No JVD.  Chest and Lung Exam Auscultation: Breath Sounds:-Normal.  Cardiovascular Auscultation:Rythm- Regular. Murmurs & Other Heart Sounds:Auscultation of the heart reveals- No Murmurs.  Abdomen Inspection:-Inspeection Normal. Palpation/Percussion:Note:No mass. Palpation and Percussion of the abdomen reveal- Non Tender, Non Distended + BS, no rebound or guarding.  Neurologic Cranial Nerve exam:- CN III-XII intact(No nystagmus), symmetric smile. Strength:- 5/5 equal and symmetric strength both upper and lower extremities.      Assessment & Plan:   Patient Instructions  1. Elevated blood sugar Low sugar diet and will see if in diabetic range - Hemoglobin A1c - Comp Met (CMET)  2. Other migraine without status migrainosus, not intractable - continue sumitriptan for acute migraine and advise topamax for prevention.  3. Anxiety -well controlled with buspar.   4.  Hypertension, unspecified type  -borderline controlled. Not on bp today. Please take med today.  5. For obesity -continue wegovy 1.7 mg weekly   Follow up date to be determined after lab review.         Esperanza Richters, PA-C   Did also fill out fmla form today.  Esperanza Richters, PA-C

## 2023-07-29 MED ORDER — SUMATRIPTAN SUCCINATE 50 MG PO TABS: 50.0000 mg | ORAL_TABLET | ORAL | 11 refills | Status: DC | PRN

## 2023-07-29 MED ORDER — TOPIRAMATE 25 MG PO TABS: ORAL_TABLET | ORAL | 3 refills | Status: AC

## 2023-07-29 NOTE — Addendum Note (Signed)
Addended by: Gwenevere Abbot on: 07/29/2023 06:52 AM   Modules accepted: Orders

## 2023-07-29 NOTE — Addendum Note (Signed)
Addended by: Gwenevere Abbot on: 07/29/2023 06:46 AM   Modules accepted: Orders

## 2023-08-16 ENCOUNTER — Encounter: Payer: Self-pay | Admitting: Medical

## 2023-08-22 ENCOUNTER — Encounter: Payer: Self-pay | Admitting: Medical

## 2023-08-22 NOTE — Telephone Encounter (Signed)
Can you get forms out my drawer and place in red folder

## 2023-08-22 NOTE — Telephone Encounter (Signed)
Will you fax over pt ammended fl[employer] forms. It is on computer desk key board.

## 2023-08-23 NOTE — Telephone Encounter (Signed)
Spoke with [employer] beginning and end date needs to be on line 8 ( line 8 on form was updated incase other issues come ) , and then I was placed on a brief hold and the receptionist stated the forms were okay and pt's employer needs to reach out to [employer] patient notified .

## 2023-08-23 NOTE — Telephone Encounter (Signed)
The form placed on my desk was from 07/28/23 - which was received by her employer and that's for her migraines  The one about her son's condition was done by another provider, which you dont have to do

## 2023-08-30 NOTE — Telephone Encounter (Signed)
Copy of fmla in red folder

## 2023-08-30 NOTE — Telephone Encounter (Signed)
I modified form to say 4 times per month 3 days per episode. The form is very messy at this point and would like new page part B.   I sent her message on my chart and she has not responded. I filled out forms and made changes originally on her request.

## 2023-09-06 NOTE — Telephone Encounter (Signed)
Reviewed and it appears pt has not sen blank forms so will give you copy of forms I have. I modified but forms are messy. Please file for future reference. See section 8. Other option is you can make copy of this form and she can pick up. But ideally want new part b page.

## 2023-10-03 NOTE — Telephone Encounter (Signed)
Form placed in provider red folder.

## 2023-10-05 ENCOUNTER — Encounter: Payer: Self-pay | Admitting: Medical

## 2023-11-15 ENCOUNTER — Other Ambulatory Visit: Payer: Self-pay | Admitting: Medical

## 2023-11-29 DIAGNOSIS — H1033 Unspecified acute conjunctivitis, bilateral: Secondary | ICD-10-CM | POA: Diagnosis not present

## 2023-11-29 DIAGNOSIS — H1012 Acute atopic conjunctivitis, left eye: Secondary | ICD-10-CM | POA: Diagnosis not present

## 2023-11-30 ENCOUNTER — Ambulatory Visit (INDEPENDENT_AMBULATORY_CARE_PROVIDER_SITE_OTHER): Payer: BC Managed Care – PPO | Admitting: Medical

## 2023-11-30 VITALS — BP 129/76 | HR 70 | Temp 98.0°F | Resp 16 | Ht 65.0 in | Wt 182.0 lb

## 2023-11-30 DIAGNOSIS — S46811A Strain of other muscles, fascia and tendons at shoulder and upper arm level, right arm, initial encounter: Secondary | ICD-10-CM

## 2023-11-30 DIAGNOSIS — S46811D Strain of other muscles, fascia and tendons at shoulder and upper arm level, right arm, subsequent encounter: Secondary | ICD-10-CM

## 2023-11-30 MED ORDER — CYCLOBENZAPRINE HCL 5 MG PO TABS: ORAL_TABLET | ORAL | 0 refills | Status: DC

## 2023-11-30 MED ORDER — METHYLPREDNISOLONE 4 MG PO TABS: ORAL_TABLET | ORAL | 0 refills | Status: DC

## 2023-11-30 NOTE — Progress Notes (Signed)
 Subjective:    Patient ID: Katie Merritt, female    DOB: 06/17/1971, 53 y.o.   MRN: [id number]  HPI Pt in with neck pain that is been constant for 3 weeks. No injury or fall. This new neck pain. Pt tried advil but did not help much. Rt side of neck is worse area. No mid spine.  Pt has been using advil for about one week.  Pt has left si area for about 3 week as well. Pt has used heating pad. No pain in mid lower back. No pain shooting down leg.  She also mentions yesterday eye were swollen and itchy. Pt went to UC and given astelin eye drops. Pt states her eyes are already feeling better. Told allergies.  Pt on review has lost weight. She state did with wegovy . Lost almost 30 lbs. She stopped using med about one month.     Review of Systems  Constitutional:  Negative for chills and fatigue.  Respiratory:  Negative for cough, chest tightness, shortness of breath and wheezing.   Cardiovascular:  Negative for chest pain and palpitations.  Gastrointestinal:  Negative for abdominal pain.  Musculoskeletal:  Negative for back pain.       Trapezius and sciatica pain  Skin:  Negative for rash.  Psychiatric/Behavioral:  Positive for behavioral problems. Negative for decreased concentration.     Past Medical History:  Diagnosis Date   Anxiety    COPD (chronic obstructive pulmonary disease) (HCC)    Hyperlipidemia    Hypertension      Social History   Socioeconomic History   Marital status: Single    Spouse name: Not on file   Number of children: Not on file   Years of education: Not on file   Highest education level: Associate degree: occupational, scientist, product/process development, or vocational program  Occupational History   Not on file  Tobacco Use   Smoking status: Former    Current packs/day: 0.00    Types: Cigarettes    Quit date: 11/25/2013    Years since quitting: 10.0   Smokeless tobacco: Never  Vaping Use   Vaping status: Never Used  Substance and Sexual Activity   Alcohol use: Yes     Alcohol/week: 0.0 standard drinks of alcohol    Comment: occ   Drug use: Not Currently   Sexual activity: Not on file  Other Topics Concern   Not on file  Social History Narrative   Not on file   Social Drivers of Health   Financial Resource Strain: Medium Risk (11/30/2023)   Overall Financial Resource Strain (CARDIA)    Difficulty of Paying Living Expenses: Somewhat hard  Food Insecurity: Food Insecurity Present (11/30/2023)   Hunger Vital Sign    Worried About Running Out of Food in the Last Year: Patient declined    Ran Out of Food in the Last Year: Sometimes true  Transportation Needs: No Transportation Needs (01/24/2023)   PRAPARE - Administrator, Civil Service (Medical): No    Lack of Transportation (Non-Medical): No  Physical Activity: Inactive (11/30/2023)   Exercise Vital Sign    Days of Exercise per Week: 0 days    Minutes of Exercise per Session: 30 min  Stress: Stress Concern Present (01/24/2023)   [employer] of Occupational Health - Occupational Stress Questionnaire    Feeling of Stress : Rather much  Social Connections: Unknown (11/30/2023)   Social Connection and Isolation Panel [NHANES]    Frequency of Communication with Friends and  Family: More than three times a week    Frequency of Social Gatherings with Friends and Family: Patient declined    Attends Religious Services: Patient declined    Database Administrator or Organizations: No    Attends Engineer, Structural: Not on file    Marital Status: Never married  Catering Manager Violence: Not on file    Past Surgical History:  Procedure Laterality Date   Leap     TUBAL LIGATION      Family History  Problem Relation Age of Onset   Hyperlipidemia Mother    Hypertension Mother    Colon cancer Neg Hx    Colon polyps Neg Hx    Esophageal cancer Neg Hx    Rectal cancer Neg Hx    Stomach cancer Neg Hx     No Known Allergies  Current Outpatient Medications on File Prior to Visit   Medication Sig Dispense Refill   ALPRAZolam  (XANAX ) 0.25 MG tablet TAKE 1 TABLET BY MOUTH EVERY DAY AS NEEDED FOR ANXIETY 30 tablet 3   ALPRAZolam  (XANAX ) 0.5 MG tablet 1 tab po q day prn anxiety 30 tablet 1   amLODipine  (NORVASC ) 10 MG tablet TAKE 1 TABLET(10 MG) BY MOUTH DAILY 30 tablet 0   amLODipine -olmesartan  (AZOR ) 10-40 MG tablet TAKE 1 TABLET BY MOUTH DAILY 90 tablet 1   atorvastatin  (LIPITOR) 20 MG tablet Take 1 tablet (20 mg total) by mouth daily. 30 tablet 3   buPROPion  (WELLBUTRIN  XL) 150 MG 24 hr tablet TAKE 1 TABLET(150 MG) BY MOUTH DAILY 30 tablet 1   busPIRone  (BUSPAR ) 15 MG tablet Take 1 tablet (15 mg total) by mouth 2 (two) times daily. 180 tablet 1   chlorthalidone  (HYGROTON ) 25 MG tablet Take 1 tablet (25 mg total) by mouth daily. 90 tablet 3   clindamycin  (CLINDAGEL) 1 % gel Apply topically 2 (two) times daily. 30 g 0   escitalopram  (LEXAPRO ) 10 MG tablet Take 1 tablet (10 mg total) by mouth daily. 90 tablet 1   fluticasone  (FLONASE ) 50 MCG/ACT nasal spray Place 2 sprays into both nostrils daily. 16 g 1   hydrOXYzine  (ATARAX ) 10 MG tablet 1-2 tab po tid prn anxiety 30 tablet 0   levocetirizine (XYZAL ) 5 MG tablet Take 1 tablet (5 mg total) by mouth every evening. 30 tablet 3   levonorgestrel  (MIRENA ) 20 MCG/24HR IUD Mirena  20 mcg/24 hours (5 yrs) 52 mg intrauterine device  Take 1 device by intrauterine route.     olmesartan  (BENICAR ) 40 MG tablet Take 1 tablet (40 mg total) by mouth daily. 30 tablet 0   omeprazole  (PRILOSEC) 40 MG capsule TAKE 1 CAPSULE(40 MG) BY MOUTH DAILY 30 capsule 11   omeprazole  (PRILOSEC) 40 MG capsule TAKE 1 CAPSULE(40 MG) BY MOUTH DAILY 30 capsule 11   ondansetron  (ZOFRAN  ODT) 4 MG disintegrating tablet Take 1 tablet (4 mg total) by mouth every 8 (eight) hours as needed for nausea or vomiting. 20 tablet 0   Semaglutide -Weight Management (WEGOVY ) 0.5 MG/0.5ML SOAJ Inject 0.5 mg into the skin once a week. 2 mL 0   Semaglutide -Weight Management  (WEGOVY ) 1.7 MG/0.75ML SOAJ INJECT 1.7 MG INTO THE SKIN SUBCUTANEOUSLY ONCE A WEEK 3 mL 1   Semaglutide -Weight Management 1 MG/0.5ML SOAJ Inject 1 mg into the skin once a week. 2 mL 0   SUMAtriptan  (IMITREX ) 50 MG tablet Take 1 tablet (50 mg total) by mouth every 2 (two) hours as needed for migraine. May repeat in 2 hours if  headache persists or recurs. 10 tablet 11   topiramate  (TOPAMAX ) 25 MG tablet 1 tab po  twice daily 60 tablet 3   No current facility-administered medications on file prior to visit.    BP 129/76   Pulse 70   Temp 98 F (36.7 C) (Oral)   Resp 16   Ht 5' 5 (1.651 m)   Wt 182 lb (82.6 kg)   SpO2 98%   BMI 30.29 kg/m        Objective:   Physical Exam  General Appearance- Not in acute distress.  Neck-no mid cervical spine tenderness. Rt trapezius tenderness.  Chest and Lung Exam Auscultation: Breath sounds:-Normal. Clear even and unlabored. Adventitious sounds:- No Adventitious sounds.  Cardiovascular Auscultation:Rythm - Regular, rate and rythm. Heart Sounds -Normal heart sounds.  Abdomen Inspection:-Inspection Normal.  Palpation/Perucssion: Palpation and Percussion of the abdomen reveal- Non Tender, No Rebound tenderness, No rigidity(Guarding) and No Palpable abdominal masses.  Liver:-Normal.  Spleen:- Normal.   Back No Mid lumbar spine tenderness to palpation.  No Pain on rt straight leg lift. High srraight leg lift left side cause mild left si pain. Pain on lateral movements and flexion/extension of the spine. Lt si area pain.  Left hip- no pain on palpation.  Lower ext neurologic  L5-S1 sensation intact bilaterally. Normal patellar reflexes bilaterally. No foot drop bilaterally.       Assessment & Plan:   Patient Instructions  Rt side trapezius strain. No mid c spine  -flexeril  rx 5 mg at night before sleep.  Sciatica -failed alleve for one week. -rx medrol  dose taper over 6 days.  Dicussed if either area persists despite tx  then can refer to sport med.  Update me in about 7-10 days or sooner if needed    Rufina Kimery, PA-C

## 2023-11-30 NOTE — Patient Instructions (Addendum)
 Rt side trapezius strain. No mid c spine  -flexeril  rx 5 mg at night before sleep.  Sciatica -failed alleve for one week. -rx medrol  dose taper over 6 days.  Dicussed if either area persists despite tx then can refer to sport med.  Update me in about 7-10 days or sooner if needed

## 2024-02-02 ENCOUNTER — Telehealth: Payer: Self-pay

## 2024-02-02 ENCOUNTER — Other Ambulatory Visit (HOSPITAL_COMMUNITY): Payer: Self-pay

## 2024-02-02 NOTE — Telephone Encounter (Signed)
error 

## 2024-02-02 NOTE — Telephone Encounter (Signed)
 Pharmacy Patient Advocate Encounter   Received notification from CoverMyMeds that prior authorization for [facility] 1.7MG /0.75ML auto-injectors is required/requested.   Insurance verification completed.   The patient is insured through [facility] .   Per test claim: PA required; PA submitted to above mentioned insurance via CoverMyMeds Key/confirmation #/EOC [facility] Status is pending

## 2024-02-03 ENCOUNTER — Other Ambulatory Visit (HOSPITAL_COMMUNITY): Payer: Self-pay

## 2024-02-03 NOTE — Telephone Encounter (Addendum)
 Pharmacy Patient Advocate Encounter  Received notification from [facility] that Prior Authorization for [facility] 1.7MG /0.75ML auto-injectors has been APPROVED from 02/03/24 to 02/02/25   PA #/Case ID/Reference #: [id number]   *Per test claim, prescription must be prescribed by [facility] prescriber. Phone number [phone]*

## 2024-02-13 ENCOUNTER — Encounter: Payer: Self-pay | Admitting: Medical

## 2024-02-17 ENCOUNTER — Other Ambulatory Visit: Payer: Self-pay | Admitting: Medical

## 2024-02-17 ENCOUNTER — Encounter: Payer: Self-pay | Admitting: Medical

## 2024-02-17 ENCOUNTER — Ambulatory Visit (INDEPENDENT_AMBULATORY_CARE_PROVIDER_SITE_OTHER): Admitting: Medical

## 2024-02-17 VITALS — BP 138/85 | HR 66 | Resp 18 | Ht 65.0 in | Wt 194.0 lb

## 2024-02-17 DIAGNOSIS — M5442 Lumbago with sciatica, left side: Secondary | ICD-10-CM | POA: Diagnosis not present

## 2024-02-17 DIAGNOSIS — E66811 Obesity, class 1: Secondary | ICD-10-CM

## 2024-02-17 DIAGNOSIS — G8929 Other chronic pain: Secondary | ICD-10-CM | POA: Diagnosis not present

## 2024-02-17 DIAGNOSIS — E785 Hyperlipidemia, unspecified: Secondary | ICD-10-CM

## 2024-02-17 DIAGNOSIS — R739 Hyperglycemia, unspecified: Secondary | ICD-10-CM | POA: Diagnosis not present

## 2024-02-17 MED ORDER — CYCLOBENZAPRINE HCL 5 MG PO TABS: 5.0000 mg | ORAL_TABLET | Freq: Every day | ORAL | 0 refills | Status: DC

## 2024-02-17 MED ORDER — SEMAGLUTIDE-WEIGHT MANAGEMENT 0.25 MG/0.5ML ~~LOC~~ SOAJ
0.2500 mg | SUBCUTANEOUS | 0 refills | Status: DC
Start: 2024-02-17 — End: 2024-07-28

## 2024-02-17 MED ORDER — METHYLPREDNISOLONE 4 MG PO TABS: ORAL_TABLET | ORAL | 0 refills | Status: DC

## 2024-02-17 NOTE — Progress Notes (Signed)
 Subjective:    Patient ID: Katie Merritt, female    DOB: 28-May-1971, 53 y.o.   MRN: [id number]  HPI SENNA LAPE "Katie Merritt" is a 53 year old female who presents for weight management and sciatica pain.  She has a history of weight management issues and was previously on Wegovy , which helped her reduce her weight from 203 pounds to 173 pounds. After discontinuing Wegovy  due to side effects at higher doses, including nausea and vomiting, her weight increased to 194 pounds. She experienced more severe side effects with Saxenda  compared to Wegovy . She has been off Wegovy  for about two to three months.  She has a history of sciatica, which flared up after she stopped Wegovy (she thinks related to weight gain). The pain began to increase about two weeks after her medication ran out. She previously experienced relief with a Medrol  taper, but the pain returned gradually and has persisted for a couple of months. The pain is located in the SI area, radiating to the left leg, and is exacerbated by pressure on the left leg. She has been managing the pain with Aleve as needed for the past few weeks. No weakness in her leg or pain radiating to her foot.  Her family history is negative for thyroid  cancer and no history of pancreatitis. No contraindication to glp-1. She states will make a point to avoid processed carbs , greasy and fried foods as well in order to lose weight.  She has been trying to maintain a healthy diet and has been walking for exercise.   Review of Systems  Constitutional:  Negative for chills, fatigue and fever.  HENT:  Negative for congestion and ear discharge.   Respiratory:  Negative for cough, chest tightness, shortness of breath and wheezing.   Cardiovascular:  Negative for chest pain and palpitations.  Gastrointestinal:  Negative for abdominal pain, diarrhea, nausea and vomiting.  Musculoskeletal:  Positive for back pain.       Left si area pain an mild left side paralumbar  tendernss.   Skin:  Negative for rash.  Neurological:  Negative for dizziness, light-headedness and numbness.  Hematological:  Negative for adenopathy.  Psychiatric/Behavioral:  Negative for behavioral problems and decreased concentration. The patient is not nervous/anxious.     Past Medical History:  Diagnosis Date   Anxiety    COPD (chronic obstructive pulmonary disease) (HCC)    Hyperlipidemia    Hypertension      Social History   Socioeconomic History   Marital status: Single    Spouse name: Not on file   Number of children: Not on file   Years of education: Not on file   Highest education level: Associate degree: occupational, Scientist, product/process development, or vocational program  Occupational History   Not on file  Tobacco Use   Smoking status: Former    Current packs/day: 0.00    Types: Cigarettes    Quit date: 11/25/2013    Years since quitting: 10.2   Smokeless tobacco: Never  Vaping Use   Vaping status: Never Used  Substance and Sexual Activity   Alcohol use: Yes    Alcohol/week: 0.0 standard drinks of alcohol    Comment: occ   Drug use: Not Currently   Sexual activity: Not on file  Other Topics Concern   Not on file  Social History Narrative   Not on file   Social Drivers of Health   Financial Resource Strain: Medium Risk (11/30/2023)   Overall Financial Resource Strain (CARDIA)  Difficulty of Paying Living Expenses: Somewhat hard  Food Insecurity: Food Insecurity Present (11/30/2023)   Hunger Vital Sign    Worried About Running Out of Food in the Last Year: Patient declined    Ran Out of Food in the Last Year: Sometimes true  Transportation Needs: No Transportation Needs (01/24/2023)   PRAPARE - Administrator, Civil Service (Medical): No    Lack of Transportation (Non-Medical): No  Physical Activity: Unknown (11/30/2023)   Exercise Vital Sign    Days of Exercise per Week: 0 days    Minutes of Exercise per Session: Not on file  Recent Concern: Physical Activity  - Inactive (11/30/2023)   Exercise Vital Sign    Days of Exercise per Week: 0 days    Minutes of Exercise per Session: 30 min  Stress: Stress Concern Present (01/24/2023)   [employer] of Occupational Health - Occupational Stress Questionnaire    Feeling of Stress : Rather much  Social Connections: Unknown (11/30/2023)   Social Connection and Isolation Panel [NHANES]    Frequency of Communication with Friends and Family: More than three times a week    Frequency of Social Gatherings with Friends and Family: Patient declined    Attends Religious Services: Patient declined    Database administrator or Organizations: No    Attends Engineer, structural: Not on file    Marital Status: Never married  Catering manager Violence: Not on file    Past Surgical History:  Procedure Laterality Date   Leap     TUBAL LIGATION      Family History  Problem Relation Age of Onset   Hyperlipidemia Mother    Hypertension Mother    Colon cancer Neg Hx    Colon polyps Neg Hx    Esophageal cancer Neg Hx    Rectal cancer Neg Hx    Stomach cancer Neg Hx     Allergies  Allergen Reactions   Metronidazole  Other (See Comments)    MetroGel     Current Outpatient Medications on File Prior to Visit  Medication Sig Dispense Refill   ALPRAZolam  (XANAX ) 0.25 MG tablet TAKE 1 TABLET BY MOUTH EVERY DAY AS NEEDED FOR ANXIETY 30 tablet 3   ALPRAZolam  (XANAX ) 0.5 MG tablet 1 tab po q day prn anxiety 30 tablet 1   amLODipine  (NORVASC ) 10 MG tablet TAKE 1 TABLET(10 MG) BY MOUTH DAILY 30 tablet 0   amLODipine -olmesartan  (AZOR ) 10-40 MG tablet TAKE 1 TABLET BY MOUTH DAILY 90 tablet 1   atorvastatin  (LIPITOR) 20 MG tablet Take 1 tablet (20 mg total) by mouth daily. 30 tablet 3   buPROPion  (WELLBUTRIN  XL) 150 MG 24 hr tablet TAKE 1 TABLET(150 MG) BY MOUTH DAILY 30 tablet 1   busPIRone  (BUSPAR ) 15 MG tablet Take 1 tablet (15 mg total) by mouth 2 (two) times daily. 180 tablet 1   chlorthalidone  (HYGROTON ) 25  MG tablet Take 1 tablet (25 mg total) by mouth daily. 90 tablet 3   clindamycin  (CLINDAGEL) 1 % gel Apply topically 2 (two) times daily. 30 g 0   cyclobenzaprine  (FLEXERIL ) 5 MG tablet 1 tab po qs prn trapezius pain 3 tablet 0   escitalopram  (LEXAPRO ) 10 MG tablet Take 1 tablet (10 mg total) by mouth daily. 90 tablet 1   fluticasone  (FLONASE ) 50 MCG/ACT nasal spray Place 2 sprays into both nostrils daily. 16 g 1   hydrOXYzine  (ATARAX ) 10 MG tablet 1-2 tab po tid prn anxiety 30 tablet 0  levocetirizine (XYZAL ) 5 MG tablet Take 1 tablet (5 mg total) by mouth every evening. 30 tablet 3   levonorgestrel  (MIRENA ) 20 MCG/24HR IUD Mirena  20 mcg/24 hours (5 yrs) 52 mg intrauterine device  Take 1 device by intrauterine route.     olmesartan  (BENICAR ) 40 MG tablet Take 1 tablet (40 mg total) by mouth daily. 30 tablet 0   omeprazole  (PRILOSEC) 40 MG capsule TAKE 1 CAPSULE(40 MG) BY MOUTH DAILY 30 capsule 11   omeprazole  (PRILOSEC) 40 MG capsule TAKE 1 CAPSULE(40 MG) BY MOUTH DAILY 30 capsule 11   ondansetron  (ZOFRAN  ODT) 4 MG disintegrating tablet Take 1 tablet (4 mg total) by mouth every 8 (eight) hours as needed for nausea or vomiting. 20 tablet 0   Semaglutide -Weight Management (WEGOVY ) 0.5 MG/0.5ML SOAJ Inject 0.5 mg into the skin once a week. 2 mL 0   Semaglutide -Weight Management (WEGOVY ) 1.7 MG/0.75ML SOAJ INJECT 1.7 MG INTO THE SKIN SUBCUTANEOUSLY ONCE A WEEK 3 mL 1   Semaglutide -Weight Management 1 MG/0.5ML SOAJ Inject 1 mg into the skin once a week. 2 mL 0   SUMAtriptan  (IMITREX ) 50 MG tablet Take 1 tablet (50 mg total) by mouth every 2 (two) hours as needed for migraine. May repeat in 2 hours if headache persists or recurs. 10 tablet 11   topiramate  (TOPAMAX ) 25 MG tablet 1 tab po  twice daily 60 tablet 3   No current facility-administered medications on file prior to visit.    BP 138/85   Pulse 66   Resp 18   Ht 5\' 5"  (1.651 m)   Wt 194 lb (88 kg)   SpO2 98%   BMI 32.28 kg/m         Objective:   Physical Exam  General Appearance- Not in acute distress.    Chest and Lung Exam Auscultation: Breath sounds:-Normal. Clear even and unlabored. Adventitious sounds:- No Adventitious sounds.  Cardiovascular Auscultation:Rythm - Regular, rate and rythm. Heart Sounds -Normal heart sounds.  Abdomen Inspection:-Inspection Normal.  Palpation/Perucssion: Palpation and Percussion of the abdomen reveal- Non Tender, No Rebound tenderness, No rigidity(Guarding) and No Palpable abdominal masses.  Liver:-Normal.  Spleen:- Normal.   Back Left side para lumbar spine tenderness to palpation. Left si tenderness to palpation.  No pain on straight leg lift. Pain on lateral movements and flexion/extension of the spine.  Lower ext neurologic  L5-S1 sensation intact bilaterally. Normal patellar reflexes bilaterally. No foot drop bilaterally.       Assessment & Plan:   Obesity Obesity with weight regain after Wegovy  discontinuation. Restarting Wegovy  at lowest dose with titration as necessary. Emphasized monitoring for side effects and importance of lifestyle modifications. - Restart Wegovy  at lowest dose, titrate as tolerated. - Encourage healthy eating, reduce sodium, avoid greasy foods.(lean meats, vegatables and fruits recommended) - Advise regular exercise, including walking and weight lifting. - Monitor for side effects, report via MyChart. - Provide weight loss and side effects update in three weeks via MyChart.  Nausea due to medication Nausea associated with higher doses of Wegovy . Monitoring necessary to get lipase pancreatitis like symptoms occur as discussed. - Monitor for nausea and abdominal pain upon restarting Wegovy . - Order lipase level if significant nausea with abdominal pain or vomiting occurs.  Sciatica Chronic sciatica. Temporary relief with previous Medrol  taper. Potential need for further imaging or referral if pain persists. - Prescribe Medrol   6-day taper. - Prescribe Flexeril  5 mg at night for muscle relaxation. - Advise to update in one week regarding pain resolution. -  Consider referral to sports medicine if pain persists. - Consider lumbar x-ray if pain persists.  Follow up 3 week my chart update as to how you are doing with weight loss and if any side effects.   Ariell Gunnels, PA-C

## 2024-02-17 NOTE — Patient Instructions (Signed)
 Obesity Obesity with weight regain after Wegovy  discontinuation. Restarting Wegovy  at lowest dose with titration as necessary. Emphasized monitoring for side effects and importance of lifestyle modifications. - Restart Wegovy  at lowest dose, titrate as tolerated. - Encourage healthy eating, reduce sodium, avoid greasy foods.(lean meats, vegatables and fruits recommended) - Advise regular exercise, including walking and weight lifting. - Monitor for side effects, report via MyChart. - Provide weight loss and side effects update in three weeks via MyChart.  Nausea due to medication Nausea associated with higher doses of Wegovy . Monitoring necessary to get lipase pancreatitis like symptoms occur as discussed. - Monitor for nausea and abdominal pain upon restarting Wegovy . - Order lipase level if significant nausea with abdominal pain or vomiting occurs.  Sciatica Chronic sciatica. Temporary relief with previous Medrol  taper. Potential need for further imaging or referral if pain persists. - Prescribe Medrol  6-day taper. - Prescribe Flexeril  5 mg at night for muscle relaxation. - Advise to update in one week regarding pain resolution. - Consider referral to sports medicine if pain persists. - Consider lumbar x-ray if pain persists.  Follow up 3 week my chart update as to how you are doing with weight loss and if any side effects.

## 2024-02-20 ENCOUNTER — Encounter: Payer: Self-pay | Admitting: Medical

## 2024-02-21 NOTE — Addendum Note (Signed)
 Addended by: Serafina Damme on: 02/21/2024 06:26 AM   Modules accepted: Orders

## 2024-02-22 ENCOUNTER — Other Ambulatory Visit (HOSPITAL_COMMUNITY): Payer: Self-pay

## 2024-02-22 NOTE — Telephone Encounter (Signed)
 PA has been approved as of 02/03/2024, however, per test claim, medication must be prescribed by a [address] Prescriber" [phone]. Pt must call [facility] to set up appt with [facility] provider. Thank you

## 2024-02-24 ENCOUNTER — Other Ambulatory Visit

## 2024-02-28 ENCOUNTER — Other Ambulatory Visit (INDEPENDENT_AMBULATORY_CARE_PROVIDER_SITE_OTHER)

## 2024-02-28 DIAGNOSIS — R944 Abnormal results of kidney function studies: Secondary | ICD-10-CM | POA: Diagnosis not present

## 2024-02-28 DIAGNOSIS — E785 Hyperlipidemia, unspecified: Secondary | ICD-10-CM | POA: Diagnosis not present

## 2024-02-28 LAB — LIPID PANEL
Cholesterol: 251 mg/dL — ABNORMAL HIGH (ref 0–200)
HDL: 50.1 mg/dL (ref >39.00)
LDL Cholesterol: 181 mg/dL — ABNORMAL HIGH (ref 0–99)
NonHDL: 200.99
Total CHOL/HDL Ratio: 5
Triglycerides: 99 mg/dL (ref 0.0–149.0)
VLDL: 19.8 mg/dL (ref 0.0–40.0)

## 2024-02-28 LAB — COMPREHENSIVE METABOLIC PANEL WITH GFR
ALT: 12 U/L (ref 0–35)
AST: 13 U/L (ref 0–37)
Albumin: 4.4 g/dL (ref 3.5–5.2)
Alkaline Phosphatase: 130 U/L — ABNORMAL HIGH (ref 39–117)
BUN: 15 mg/dL (ref 6–23)
CO2: 27 meq/L (ref 19–32)
Calcium: 9.5 mg/dL (ref 8.4–10.5)
Chloride: 104 meq/L (ref 96–112)
Creatinine, Ser: 1.07 mg/dL (ref 0.40–1.20)
GFR: 59.97 mL/min — ABNORMAL LOW (ref >60.00)
Glucose, Bld: 105 mg/dL — ABNORMAL HIGH (ref 70–99)
Potassium: 3.9 meq/L (ref 3.5–5.1)
Sodium: 140 meq/L (ref 135–145)
Total Bilirubin: 0.7 mg/dL (ref 0.2–1.2)
Total Protein: 6.9 g/dL (ref 6.0–8.3)

## 2024-02-29 ENCOUNTER — Encounter: Payer: Self-pay | Admitting: Medical

## 2024-02-29 MED ORDER — ATORVASTATIN CALCIUM 10 MG PO TABS: 10.0000 mg | ORAL_TABLET | Freq: Every day | ORAL | 3 refills | Status: AC

## 2024-02-29 NOTE — Addendum Note (Signed)
 Addended by: Serafina Damme on: 02/29/2024 06:08 AM   Modules accepted: Orders

## 2024-03-01 MED ORDER — ATORVASTATIN CALCIUM 10 MG PO TABS
10.0000 mg | ORAL_TABLET | Freq: Every day | ORAL | 3 refills | Status: DC
Start: 2024-03-01 — End: 2024-07-28

## 2024-03-01 NOTE — Addendum Note (Signed)
 Addended by: Serafina Damme on: 03/01/2024 04:38 PM   Modules accepted: Orders

## 2024-03-01 NOTE — Addendum Note (Signed)
 Addended by: Serafina Damme on: 03/01/2024 04:41 PM   Modules accepted: Orders

## 2024-03-06 ENCOUNTER — Other Ambulatory Visit (INDEPENDENT_AMBULATORY_CARE_PROVIDER_SITE_OTHER)

## 2024-03-06 DIAGNOSIS — E66811 Obesity, class 1: Secondary | ICD-10-CM

## 2024-03-06 DIAGNOSIS — R739 Hyperglycemia, unspecified: Secondary | ICD-10-CM | POA: Diagnosis not present

## 2024-03-06 LAB — HEMOGLOBIN A1C: Hgb A1c MFr Bld: 6.1 % (ref 4.6–6.5)

## 2024-03-06 LAB — TSH: TSH: 1.65 u[IU]/mL (ref 0.35–5.50)

## 2024-03-07 ENCOUNTER — Ambulatory Visit: Payer: Self-pay | Admitting: Family

## 2024-03-16 ENCOUNTER — Other Ambulatory Visit: Payer: Self-pay | Admitting: Medical

## 2024-03-20 ENCOUNTER — Other Ambulatory Visit (HOSPITAL_COMMUNITY): Payer: Self-pay

## 2024-03-20 ENCOUNTER — Telehealth: Payer: Self-pay

## 2024-03-20 NOTE — Telephone Encounter (Signed)
 PLEASE BE ADVISED   PA request has been sent to [facility] . FOR Wegovy  0.5MG /0.5ML auto-injectors  PT HAS TO GOT THROUGH PROGRAM For additional info see Pharmacy Prior Auth telephone encounter  AND PT NOTES from 02/20/2024.

## 2024-05-04 ENCOUNTER — Ambulatory Visit (INDEPENDENT_AMBULATORY_CARE_PROVIDER_SITE_OTHER): Admitting: Medical

## 2024-05-04 ENCOUNTER — Encounter: Payer: Self-pay | Admitting: Medical

## 2024-05-04 VITALS — BP 138/80 | HR 76 | Temp 98.2°F | Resp 18 | Ht 65.0 in | Wt 198.4 lb

## 2024-05-04 DIAGNOSIS — G43809 Other migraine, not intractable, without status migrainosus: Secondary | ICD-10-CM

## 2024-05-04 DIAGNOSIS — M5442 Lumbago with sciatica, left side: Secondary | ICD-10-CM

## 2024-05-04 DIAGNOSIS — G8929 Other chronic pain: Secondary | ICD-10-CM

## 2024-05-04 DIAGNOSIS — I10 Essential (primary) hypertension: Secondary | ICD-10-CM | POA: Diagnosis not present

## 2024-05-04 MED ORDER — SUMATRIPTAN SUCCINATE 50 MG PO TABS: 50.0000 mg | ORAL_TABLET | ORAL | 2 refills | Status: DC | PRN

## 2024-05-04 MED ORDER — AMLODIPINE BESYLATE 10 MG PO TABS: 10.0000 mg | ORAL_TABLET | Freq: Every day | ORAL | 3 refills | Status: AC

## 2024-05-04 NOTE — Progress Notes (Signed)
   Subjective:    Patient ID: Katie Merritt, female    DOB: [birth year], 53 y.o.   MRN: [id number]  HPI  Katie Merritt is a 53 year old female with hypertension who presents with migraine headaches.  She experiences migraine headaches approximately twice a month, with each episode lasting up to three days. The most recent episodes occurred early this month.  Pt on topamax  for prevention and imitrex  for acute onset. Needs time off from work for ha.(up to 3 days per each episode.  She is currently taking amlodipine  10 mg daily for hypertension. She did not take her medication the previous night and has been checking her blood pressure in the evenings rather than in the mornings. She owns a blood pressure cuff but does not check her blood pressure as frequently as recommended, which is two to three times a week. She is not currently taking Olmesartan  or chlorthalidone , although she has taken chlorthalidone  occasionally when at home and avoids taking it at work as it make her urinate excessively.  Review of Systems See hpi. No current haa.    Objective:   Physical Exam  General Mental Status- Alert. General Appearance- Not in acute distress.   Skin General: Color- Normal Color. Moisture- Normal Moisture.  Neck Carotid Arteries- Normal color. Moisture- Normal Moisture. No carotid bruits. No JVD.  Chest and Lung Exam Auscultation: Breath Sounds:-Normal.  Cardiovascular Auscultation:Rythm- Regular. Murmurs & Other Heart Sounds:Auscultation of the heart reveals- No Murmurs.   Neurologic Cranial Nerve exam:- CN III-XII intact(No nystagmus), symmetric smile. Strength:- 5/5 equal and symmetric strength both upper and lower extremities.       Assessment & Plan:   Hypertension Hypertension managed with amlodipine  10 mg daily. Current blood pressure slightly elevated, likely due to missed dose. Previously on olmesartan  and chlorthalidone , now only on amlodipine . - Prescribe  amlodipine  10 mg daily, 90 tablets with three refills. - Instruct to monitor blood pressure two to three times a week. - Advise to report blood pressure readings in one week to ten days. - Adjust treatment if blood pressure consistently over 140/90 mmHg.  Migraine Headaches Migraine headaches occurring twice a month, sometimes lasting up to three days. No changes in frequency or severity. -topamax  and imitrex  used. Topmax for prevention. imitrex  for acute ha. -fmla form filled today.  Follow-up Follow-up plan based on blood pressure control and updates. - Follow up in three months if blood pressure is well controlled. - Adjust follow-up based on blood pressure updates.

## 2024-05-04 NOTE — Patient Instructions (Signed)
 Hypertension Hypertension managed with amlodipine  10 mg daily. Current blood pressure slightly elevated, likely due to missed dose. Previously on olmesartan  and chlorthalidone , now only on amlodipine . - Prescribe amlodipine  10 mg daily, 90 tablets with three refills. - Instruct to monitor blood pressure two to three times a week. - Advise to report blood pressure readings in one week to ten days. - Adjust treatment if blood pressure consistently over 140/90 mmHg.  Migraine Headaches Migraine headaches occurring twice a month, sometimes lasting up to three days. No changes in frequency or severity. -topamax  and imitrex  used. Topmax for prevention. imitrex  for acute ha. -fmla form filled today.  Follow-up Follow-up plan based on blood pressure control and updates. - Follow up in three months if blood pressure is well controlled. - Adjust follow-up based on blood pressure updates.

## 2024-05-08 ENCOUNTER — Encounter: Payer: Self-pay | Admitting: Medical

## 2024-05-08 NOTE — Addendum Note (Signed)
 Addended by: DORINA DALLAS HERO on: 05/08/2024 05:03 PM   Modules accepted: Orders

## 2024-05-09 DIAGNOSIS — M545 Low back pain, unspecified: Secondary | ICD-10-CM | POA: Diagnosis not present

## 2024-05-11 ENCOUNTER — Ambulatory Visit (INDEPENDENT_AMBULATORY_CARE_PROVIDER_SITE_OTHER): Admitting: Medical

## 2024-05-11 VITALS — BP 140/85 | HR 60 | Temp 98.0°F | Resp 18 | Ht 65.0 in | Wt 203.0 lb

## 2024-05-11 DIAGNOSIS — I10 Essential (primary) hypertension: Secondary | ICD-10-CM | POA: Diagnosis not present

## 2024-05-11 DIAGNOSIS — G8929 Other chronic pain: Secondary | ICD-10-CM | POA: Diagnosis not present

## 2024-05-11 DIAGNOSIS — M5442 Lumbago with sciatica, left side: Secondary | ICD-10-CM

## 2024-05-11 NOTE — Progress Notes (Signed)
 Subjective:    Patient ID: Katie Merritt, female    DOB: 1971/05/09, 52 y.o.   MRN: [id number]  HPI Katie Merritt is a 53 year old female who presents with recurrent left-sided sciatica and lower back pain.  She has experienced intermittent sciatic nerve pain for several months, which has recently increased in severity. The pain is located on the left side, radiating from the lower back through the buttocks and down to the upper calf, causing tingling sensations. She describes the pain as 'locking' her back up at night, leading to muscle spasms and significant discomfort.  Approximately two months ago, she was prescribed medication that temporarily alleviated her symptoms. However, the pain has since returned, prompting a visit to urgent care. She has been on a prednisone taper and muscle relaxant regimen for three days, which has provided some relief, though she still experiences spasms, particularly at night. She initially misunderstood the prednisone dosing instructions, taking fewer tablets than prescribed(explained only took one tab a day then increased to 2 tab). She also uses Flexeril  10 mg at nigh rather than 5 mg , which helps her sleep at night  but causes daytime sedation if taken during the day.  The pain has been persistent for at least four months, with an increase in frequency and intensity since February or March, coinciding with weight gain. She reports difficulty sleeping due to the pain, having not slept for three nights prior to her urgent care visit. She uses Tylenol during the day and has tried a lidocaine patch for localized relief. No pain radiating to her foot.   Review of Systems  Constitutional:  Negative for chills, fatigue and fever.  Respiratory:  Negative for cough, chest tightness, shortness of breath and wheezing.   Cardiovascular:  Negative for chest pain and palpitations.  Gastrointestinal:  Negative for abdominal distention, anal bleeding, blood in  stool and constipation.  Genitourinary:  Negative for dysuria, frequency, menstrual problem and urgency.  Musculoskeletal:  Positive for back pain. Negative for joint swelling and neck pain.       Left side Sciatica  Skin:  Negative for rash.  Neurological:  Negative for dizziness and light-headedness.  Hematological:  Negative for adenopathy. Does not bruise/bleed easily.  Psychiatric/Behavioral:  Negative for behavioral problems and confusion.     Past Medical History:  Diagnosis Date   Anxiety    COPD (chronic obstructive pulmonary disease) (HCC)    Hyperlipidemia    Hypertension      Social History   Socioeconomic History   Marital status: Single    Spouse name: Not on file   Number of children: Not on file   Years of education: Not on file   Highest education level: Associate degree: occupational, Scientist, product/process development, or vocational program  Occupational History   Not on file  Tobacco Use   Smoking status: Former    Current packs/day: 0.00    Types: Cigarettes    Quit date: 11/25/2013    Years since quitting: 10.4   Smokeless tobacco: Never  Vaping Use   Vaping status: Never Used  Substance and Sexual Activity   Alcohol use: Yes    Alcohol/week: 0.0 standard drinks of alcohol    Comment: occ   Drug use: Not Currently   Sexual activity: Not on file  Other Topics Concern   Not on file  Social History Narrative   Not on file   Social Drivers of Health   Financial Resource Strain: Medium Risk (05/11/2024)  Overall Financial Resource Strain (CARDIA)    Difficulty of Paying Living Expenses: Somewhat hard  Food Insecurity: No Food Insecurity (05/11/2024)   Hunger Vital Sign    Worried About Running Out of Food in the Last Year: Never true    Ran Out of Food in the Last Year: Never true  Transportation Needs: No Transportation Needs (05/11/2024)   PRAPARE - Administrator, Civil Service (Medical): No    Lack of Transportation (Non-Medical): No  Physical Activity:  Insufficiently Active (05/11/2024)   Exercise Vital Sign    Days of Exercise per Week: 1 day    Minutes of Exercise per Session: 30 min  Stress: No Stress Concern Present (05/11/2024)   [employer] of Occupational Health - Occupational Stress Questionnaire    Feeling of Stress: Only a little  Social Connections: Moderately Isolated (05/11/2024)   Social Connection and Isolation Panel    Frequency of Communication with Friends and Family: More than three times a week    Frequency of Social Gatherings with Friends and Family: Twice a week    Attends Religious Services: More than 4 times per year    Active Member of [employer] or Organizations: No    Attends Engineer, structural: Not on file    Marital Status: Never married  Catering manager Violence: Not on file    Past Surgical History:  Procedure Laterality Date   Leap     TUBAL LIGATION      Family History  Problem Relation Age of Onset   Hyperlipidemia Mother    Hypertension Mother    Colon cancer Neg Hx    Colon polyps Neg Hx    Esophageal cancer Neg Hx    Rectal cancer Neg Hx    Stomach cancer Neg Hx     Allergies  Allergen Reactions   Metronidazole  Other (See Comments)    MetroGel     Current Outpatient Medications on File Prior to Visit  Medication Sig Dispense Refill   ALPRAZolam  (XANAX ) 0.25 MG tablet TAKE 1 TABLET BY MOUTH EVERY DAY AS NEEDED FOR ANXIETY 30 tablet 3   ALPRAZolam  (XANAX ) 0.5 MG tablet 1 tab po q day prn anxiety 30 tablet 1   amLODipine  (NORVASC ) 10 MG tablet Take 1 tablet (10 mg total) by mouth daily. 90 tablet 3   atorvastatin  (LIPITOR) 10 MG tablet Take 1 tablet (10 mg total) by mouth daily. 90 tablet 3   atorvastatin  (LIPITOR) 10 MG tablet Take 1 tablet (10 mg total) by mouth daily. 90 tablet 3   atorvastatin  (LIPITOR) 20 MG tablet Take 1 tablet (20 mg total) by mouth daily. 30 tablet 3   buPROPion  (WELLBUTRIN  XL) 150 MG 24 hr tablet TAKE 1 TABLET(150 MG) BY MOUTH DAILY 30 tablet 1    busPIRone  (BUSPAR ) 15 MG tablet Take 1 tablet (15 mg total) by mouth 2 (two) times daily. 180 tablet 1   clindamycin  (CLINDAGEL) 1 % gel Apply topically 2 (two) times daily. 30 g 0   cyclobenzaprine  (FLEXERIL ) 5 MG tablet 1 tab po qs prn trapezius pain 3 tablet 0   cyclobenzaprine  (FLEXERIL ) 5 MG tablet Take 1 tablet (5 mg total) by mouth at bedtime. 5 tablet 0   escitalopram  (LEXAPRO ) 10 MG tablet Take 1 tablet (10 mg total) by mouth daily. 90 tablet 1   fluticasone  (FLONASE ) 50 MCG/ACT nasal spray Place 2 sprays into both nostrils daily. 16 g 1   hydrOXYzine  (ATARAX ) 10 MG tablet 1-2 tab po tid  prn anxiety 30 tablet 0   levocetirizine (XYZAL ) 5 MG tablet Take 1 tablet (5 mg total) by mouth every evening. 30 tablet 3   levonorgestrel  (MIRENA ) 20 MCG/24HR IUD Mirena  20 mcg/24 hours (5 yrs) 52 mg intrauterine device  Take 1 device by intrauterine route.     methylPREDNISolone  (MEDROL ) 4 MG tablet Standard taper over 6 days 21 tablet 0   omeprazole  (PRILOSEC) 40 MG capsule TAKE 1 CAPSULE(40 MG) BY MOUTH DAILY 30 capsule 11   omeprazole  (PRILOSEC) 40 MG capsule TAKE 1 CAPSULE(40 MG) BY MOUTH DAILY 30 capsule 11   ondansetron  (ZOFRAN  ODT) 4 MG disintegrating tablet Take 1 tablet (4 mg total) by mouth every 8 (eight) hours as needed for nausea or vomiting. 20 tablet 0   Semaglutide -Weight Management (WEGOVY ) 1.7 MG/0.75ML SOAJ INJECT 1.7 MG INTO THE SKIN SUBCUTANEOUSLY ONCE A WEEK 3 mL 1   Semaglutide -Weight Management 0.25 MG/0.5ML SOAJ Inject 0.25 mg into the skin once a week. 2 mL 0   Semaglutide -Weight Management 1 MG/0.5ML SOAJ Inject 1 mg into the skin once a week. 2 mL 0   SUMAtriptan  (IMITREX ) 50 MG tablet Take 1 tablet (50 mg total) by mouth every 2 (two) hours as needed for migraine. May repeat in 2 hours if headache persists or recurs. 10 tablet 2   topiramate  (TOPAMAX ) 25 MG tablet 1 tab po  twice daily 60 tablet 3   WEGOVY  0.5 MG/0.5ML SOAJ INJECT 0.5 MG UNDER THE SKIN ONCE WEEKLY 2 mL 0    No current facility-administered medications on file prior to visit.    BP (!) 140/85   Pulse 60   Temp 98 F (36.7 C)   Resp 18   Ht 5' 5 (1.651 m)   Wt 203 lb (92.1 kg)   SpO2 98%   BMI 33.78 kg/m        Objective:   Physical Exam  General Appearance- Not in acute distress.    Chest and Lung Exam Auscultation: Breath sounds:-Normal. Clear even and unlabored. Adventitious sounds:- No Adventitious sounds.  Cardiovascular Auscultation:Rythm - Regular, rate and rythm. Heart Sounds -Normal heart sounds.  Abdomen Inspection:-Inspection Normal.  Palpation/Perucssion: Palpation and Percussion of the abdomen reveal- Non Tender, No Rebound tenderness, No rigidity(Guarding) and No Palpable abdominal masses.  Liver:-Normal.  Spleen:- Normal.   Back Mid left side lumbar spine and more si tenderness to palpation. Pain on straight leg lift. Pain on lateral movements and flexion/extension of the spine.  Lower ext neurologic  L5-S1 sensation intact bilaterally. Normal patellar reflexes bilaterally. No foot drop bilaterally.       Assessment & Plan:  Lumbago with left side sciatica Chronic sciatica with acute exacerbation. Declined Medrol  injection. - Continue prednisone taper starting at six tablets on the first day, decreasing by one tablet each day. - Continue Flexeril  10 mg at night for muscle spasms. Can decrease to 5 mg in about 3-4 days. - Use acetaminophen during the day for pain management. - Apply lidocaine patch to affected area for localized pain relief. - Consider use of elastic belt for back support for 3-4 days. - Provide back stretching exercises to be done as tolerated after 3-4 days. - Follow up specialist sport med  appointment on August 1st.(ssoner if possible) -[facility] has spine and PT referral in process? - If significant pain persists by next Thursday, order lumbar spine X-ray at [facility]. -xray lumbar spine if pain not improved by late next  week. Sooner if worsens.  Hypertension Blood pressure at  140/85, borderline controlled. Pain and delayed medication intake may influence reading. - Continue current antihypertensive medications.  Follow up date with me depending on my chart update late next week.   Issis Lindseth, PA-C

## 2024-05-11 NOTE — Patient Instructions (Addendum)
 Lumbago with left side sciatica Chronic sciatica with acute exacerbation. Declined Medrol  injection. - Continue prednisone taper starting at six tablets on the first day, decreasing by one tablet each day. - Continue Flexeril  10 mg at night for muscle spasms. Can decrease to 5 mg in about 3-4 days. - Use acetaminophen during the day for pain management. - Apply lidocaine patch to affected area for localized pain relief. - Consider use of elastic belt for back support for 3-4 days. - Provide back stretching exercises to be done as tolerated after 3-4 days. - Follow up specialist sport med  appointment on August 1st.(ssoner if possible) -[facility] has spine and PT referral in process? - If significant pain persists by next Thursday, order lumbar spine X-ray at [facility]. -xray lumbar spine if pain not improved by late next week. Sooner if worsens.  Hypertension Blood pressure at 140/85, borderline controlled. Pain and delayed medication intake may influence reading. - Continue current antihypertensive medications.  Follow up date with me depending on my chart update late next week.  Back Exercises The following exercises strengthen the muscles that help to support the trunk (torso) and back. They also help to keep the lower back flexible. Doing these exercises can help to prevent or lessen existing low back pain. If you have back pain or discomfort, try doing these exercises 2-3 times each day or as told by your health care provider. As your pain improves, do them once each day, but increase the number of times that you repeat the steps for each exercise (do more repetitions). To prevent the recurrence of back pain, continue to do these exercises once each day or as told by your health care provider. Do exercises exactly as told by your health care provider and adjust them as directed. It is normal to feel mild stretching, pulling, tightness, or discomfort as you do these exercises, but you should  stop right away if you feel sudden pain or your pain gets worse. Exercises Single knee to chest Repeat these steps 3-5 times for each leg: Lie on your back on a firm bed or the floor with your legs extended. Bring one knee to your chest. Your other leg should stay extended and in contact with the floor. Hold your knee in place by grabbing your knee or thigh with both hands and hold. Pull on your knee until you feel a gentle stretch in your lower back or buttocks. Hold the stretch for 10-30 seconds. Slowly release and straighten your leg.  Pelvic tilt Repeat these steps 5-10 times: Lie on your back on a firm bed or the floor with your legs extended. Bend your knees so they are pointing toward the ceiling and your feet are flat on the floor. Tighten your lower abdominal muscles to press your lower back against the floor. This motion will tilt your pelvis so your tailbone points up toward the ceiling instead of pointing to your feet or the floor. With gentle tension and even breathing, hold this position for 5-10 seconds.  Cat-cow Repeat these steps until your lower back becomes more flexible: Get into a hands-and-knees position on a firm bed or the floor. Keep your hands under your shoulders, and keep your knees under your hips. You may place padding under your knees for comfort. Let your head hang down toward your chest. Contract your abdominal muscles and point your tailbone toward the floor so your lower back becomes rounded like the back of a cat. Hold this position for 5  seconds. Slowly lift your head, let your abdominal muscles relax, and point your tailbone up toward the ceiling so your back forms a sagging arch like the back of a cow. Hold this position for 5 seconds.  Press-ups Repeat these steps 5-10 times: Lie on your abdomen (face-down) on a firm bed or the floor. Place your palms near your head, about shoulder-width apart. Keeping your back as relaxed as possible and keeping  your hips on the floor, slowly straighten your arms to raise the top half of your body and lift your shoulders. Do not use your back muscles to raise your upper torso. You may adjust the placement of your hands to make yourself more comfortable. Hold this position for 5 seconds while you keep your back relaxed. Slowly return to lying flat on the floor.  Bridges Repeat these steps 10 times: Lie on your back on a firm bed or the floor. Bend your knees so they are pointing toward the ceiling and your feet are flat on the floor. Your arms should be flat at your sides, next to your body. Tighten your buttocks muscles and lift your buttocks off the floor until your waist is at almost the same height as your knees. You should feel the muscles working in your buttocks and the back of your thighs. If you do not feel these muscles, slide your feet 1-2 inches (2.5-5 cm) farther away from your buttocks. Hold this position for 3-5 seconds. Slowly lower your hips to the starting position, and allow your buttocks muscles to relax completely. If this exercise is too easy, try doing it with your arms crossed over your chest. Abdominal crunches Repeat these steps 5-10 times: Lie on your back on a firm bed or the floor with your legs extended. Bend your knees so they are pointing toward the ceiling and your feet are flat on the floor. Cross your arms over your chest. Tip your chin slightly toward your chest without bending your neck. Tighten your abdominal muscles and slowly raise your torso high enough to lift your shoulder blades a tiny bit off the floor. Avoid raising your torso higher than that because it can put too much stress on your lower back and does not help to strengthen your abdominal muscles. Slowly return to your starting position.  Back lifts Repeat these steps 5-10 times: Lie on your abdomen (face-down) with your arms at your sides, and rest your forehead on the floor. Tighten the muscles in  your legs and your buttocks. Slowly lift your chest off the floor while you keep your hips pressed to the floor. Keep the back of your head in line with the curve in your back. Your eyes should be looking at the floor. Hold this position for 3-5 seconds. Slowly return to your starting position.  Contact a health care provider if: Your back pain or discomfort gets much worse when you do an exercise. Your worsening back pain or discomfort does not lessen within 2 hours after you exercise. If you have any of these problems, stop doing these exercises right away. Do not do them again unless your health care provider says that you can. Get help right away if: You develop sudden, severe back pain. If this happens, stop doing the exercises right away. Do not do them again unless your health care provider says that you can. This information is not intended to replace advice given to you by your health care provider. Make sure you discuss any questions you  have with your health care provider. Document Revised: 11/14/2022 Document Reviewed: 12/24/2020 Elsevier Patient Education  2024 [employer].

## 2024-05-24 NOTE — Progress Notes (Unsigned)
 [facility] [address] [place] [address] Phone: [phone]   Assessment and Plan:     There are no diagnoses linked to this encounter.  ***   Pertinent previous records reviewed include ***    Follow Up: ***     Subjective:   I, Aymee Fomby, am serving as a Neurosurgeon for Doctor Morene Mace  Chief Complaint: low back pain   HPI:   05/25/2024 Patient is a 53 year old female with low back pain. Patient states   Relevant Historical Information: ***  Additional pertinent review of systems negative.   Current Outpatient Medications:    ALPRAZolam  (XANAX ) 0.25 MG tablet, TAKE 1 TABLET BY MOUTH EVERY DAY AS NEEDED FOR ANXIETY, Disp: 30 tablet, Rfl: 3   ALPRAZolam  (XANAX ) 0.5 MG tablet, 1 tab po q day prn anxiety, Disp: 30 tablet, Rfl: 1   amLODipine  (NORVASC ) 10 MG tablet, Take 1 tablet (10 mg total) by mouth daily., Disp: 90 tablet, Rfl: 3   atorvastatin  (LIPITOR) 10 MG tablet, Take 1 tablet (10 mg total) by mouth daily., Disp: 90 tablet, Rfl: 3   atorvastatin  (LIPITOR) 10 MG tablet, Take 1 tablet (10 mg total) by mouth daily., Disp: 90 tablet, Rfl: 3   atorvastatin  (LIPITOR) 20 MG tablet, Take 1 tablet (20 mg total) by mouth daily., Disp: 30 tablet, Rfl: 3   buPROPion  (WELLBUTRIN  XL) 150 MG 24 hr tablet, TAKE 1 TABLET(150 MG) BY MOUTH DAILY, Disp: 30 tablet, Rfl: 1   busPIRone  (BUSPAR ) 15 MG tablet, Take 1 tablet (15 mg total) by mouth 2 (two) times daily., Disp: 180 tablet, Rfl: 1   clindamycin  (CLINDAGEL) 1 % gel, Apply topically 2 (two) times daily., Disp: 30 g, Rfl: 0   cyclobenzaprine  (FLEXERIL ) 5 MG tablet, 1 tab po qs prn trapezius pain, Disp: 3 tablet, Rfl: 0   cyclobenzaprine  (FLEXERIL ) 5 MG tablet, Take 1 tablet (5 mg total) by mouth at bedtime., Disp: 5 tablet, Rfl: 0   escitalopram  (LEXAPRO ) 10 MG tablet, Take 1 tablet (10 mg total) by mouth daily., Disp: 90 tablet, Rfl: 1   fluticasone  (FLONASE ) 50  MCG/ACT nasal spray, Place 2 sprays into both nostrils daily., Disp: 16 g, Rfl: 1   hydrOXYzine  (ATARAX ) 10 MG tablet, 1-2 tab po tid prn anxiety, Disp: 30 tablet, Rfl: 0   levocetirizine (XYZAL ) 5 MG tablet, Take 1 tablet (5 mg total) by mouth every evening., Disp: 30 tablet, Rfl: 3   levonorgestrel  (MIRENA ) 20 MCG/24HR IUD, Mirena  20 mcg/24 hours (5 yrs) 52 mg intrauterine device  Take 1 device by intrauterine route., Disp: , Rfl:    methylPREDNISolone  (MEDROL ) 4 MG tablet, Standard taper over 6 days, Disp: 21 tablet, Rfl: 0   omeprazole  (PRILOSEC) 40 MG capsule, TAKE 1 CAPSULE(40 MG) BY MOUTH DAILY, Disp: 30 capsule, Rfl: 11   omeprazole  (PRILOSEC) 40 MG capsule, TAKE 1 CAPSULE(40 MG) BY MOUTH DAILY, Disp: 30 capsule, Rfl: 11   ondansetron  (ZOFRAN  ODT) 4 MG disintegrating tablet, Take 1 tablet (4 mg total) by mouth every 8 (eight) hours as needed for nausea or vomiting., Disp: 20 tablet, Rfl: 0   Semaglutide -Weight Management (WEGOVY ) 1.7 MG/0.75ML SOAJ, INJECT 1.7 MG INTO THE SKIN SUBCUTANEOUSLY ONCE A WEEK, Disp: 3 mL, Rfl: 1   Semaglutide -Weight Management 0.25 MG/0.5ML SOAJ, Inject 0.25 mg into the skin once a week., Disp: 2 mL, Rfl: 0   Semaglutide -Weight Management 1 MG/0.5ML SOAJ, Inject 1 mg into the skin once  a week., Disp: 2 mL, Rfl: 0   SUMAtriptan  (IMITREX ) 50 MG tablet, Take 1 tablet (50 mg total) by mouth every 2 (two) hours as needed for migraine. May repeat in 2 hours if headache persists or recurs., Disp: 10 tablet, Rfl: 2   topiramate  (TOPAMAX ) 25 MG tablet, 1 tab po  twice daily, Disp: 60 tablet, Rfl: 3   WEGOVY  0.5 MG/0.5ML SOAJ, INJECT 0.5 MG UNDER THE SKIN ONCE WEEKLY, Disp: 2 mL, Rfl: 0   Objective:     There were no vitals filed for this visit.    There is no height or weight on file to calculate BMI.    Physical Exam:    ***   Electronically signed by:  Odis Mace [facility] 7:38 AM 05/24/24

## 2024-05-25 ENCOUNTER — Ambulatory Visit

## 2024-05-25 ENCOUNTER — Ambulatory Visit: Admitting: Sports Medicine

## 2024-05-25 ENCOUNTER — Ambulatory Visit: Payer: Self-pay | Admitting: Medical

## 2024-05-25 VITALS — HR 90

## 2024-05-25 DIAGNOSIS — G8929 Other chronic pain: Secondary | ICD-10-CM

## 2024-05-25 DIAGNOSIS — M5442 Lumbago with sciatica, left side: Secondary | ICD-10-CM | POA: Diagnosis not present

## 2024-05-25 DIAGNOSIS — M47816 Spondylosis without myelopathy or radiculopathy, lumbar region: Secondary | ICD-10-CM | POA: Diagnosis not present

## 2024-05-25 DIAGNOSIS — M51362 Other intervertebral disc degeneration, lumbar region with discogenic back pain and lower extremity pain: Secondary | ICD-10-CM

## 2024-05-25 MED ORDER — MELOXICAM 15 MG PO TABS: 15.0000 mg | ORAL_TABLET | Freq: Every day | ORAL | 0 refills | Status: DC

## 2024-05-25 NOTE — Patient Instructions (Signed)
 Low back Hep. - Start meloxicam 15 mg daily x2 weeks.  If still having pain after 2 weeks, complete 3rd-week of NSAID. May use remaining NSAID as needed once daily for pain control.  Do not to use additional over-the-counter NSAIDs (ibuprofen, naproxen, Advil, Aleve, etc.) while taking prescription NSAIDs.  May use Tylenol [phone] mg 2 to 3 times a day for breakthrough pain.  Follow up in 4 weeks.

## 2024-06-21 NOTE — Progress Notes (Deleted)
 Ben Jackson [facility] [address] [place] [address] Phone: [phone]   Assessment and Plan:     ***    Pertinent previous records reviewed include ***   Follow Up: ***     Subjective:   I, Katie Merritt, am serving as a Neurosurgeon for Doctor Morene Mace  Chief Complaint: low back pain    HPI:    05/25/2024 Patient is a 53 year old female with low back pain. Patient states has had this pain before but it went away and came back. Lower back on the left side is where the pain is located. Pain feels like a lightening bolt up or down her leg at times. Pain was keeping her from sleeping prior to taking the medication she was given from a previous doctor (prednisone, flexeril ).    Duration? About a month and a half Did you have an Injury to cause this pain?no Taking Medication for pain?aleve Numbness or Tingling?no Does the pain Radiate? Yes  Altered gait or use? yes ROM/ impairment of movement?yes     06/22/2024 Patient states  Relevant Historical Information: Hypertension, COPD  Additional pertinent review of systems negative.   Current Outpatient Medications:    ALPRAZolam  (XANAX ) 0.25 MG tablet, TAKE 1 TABLET BY MOUTH EVERY DAY AS NEEDED FOR ANXIETY, Disp: 30 tablet, Rfl: 3   ALPRAZolam  (XANAX ) 0.5 MG tablet, 1 tab po q day prn anxiety, Disp: 30 tablet, Rfl: 1   amLODipine  (NORVASC ) 10 MG tablet, Take 1 tablet (10 mg total) by mouth daily., Disp: 90 tablet, Rfl: 3   atorvastatin  (LIPITOR) 10 MG tablet, Take 1 tablet (10 mg total) by mouth daily., Disp: 90 tablet, Rfl: 3   atorvastatin  (LIPITOR) 10 MG tablet, Take 1 tablet (10 mg total) by mouth daily., Disp: 90 tablet, Rfl: 3   atorvastatin  (LIPITOR) 20 MG tablet, Take 1 tablet (20 mg total) by mouth daily., Disp: 30 tablet, Rfl: 3   buPROPion  (WELLBUTRIN  XL) 150 MG 24 hr tablet, TAKE 1 TABLET(150 MG) BY MOUTH DAILY, Disp: 30 tablet, Rfl: 1   busPIRone  (BUSPAR ) 15  MG tablet, Take 1 tablet (15 mg total) by mouth 2 (two) times daily., Disp: 180 tablet, Rfl: 1   clindamycin  (CLINDAGEL) 1 % gel, Apply topically 2 (two) times daily., Disp: 30 g, Rfl: 0   cyclobenzaprine  (FLEXERIL ) 5 MG tablet, 1 tab po qs prn trapezius pain, Disp: 3 tablet, Rfl: 0   cyclobenzaprine  (FLEXERIL ) 5 MG tablet, Take 1 tablet (5 mg total) by mouth at bedtime. (Patient not taking: Reported on 05/25/2024), Disp: 5 tablet, Rfl: 0   escitalopram  (LEXAPRO ) 10 MG tablet, Take 1 tablet (10 mg total) by mouth daily., Disp: 90 tablet, Rfl: 1   fluticasone  (FLONASE ) 50 MCG/ACT nasal spray, Place 2 sprays into both nostrils daily., Disp: 16 g, Rfl: 1   hydrOXYzine  (ATARAX ) 10 MG tablet, 1-2 tab po tid prn anxiety, Disp: 30 tablet, Rfl: 0   levocetirizine (XYZAL ) 5 MG tablet, Take 1 tablet (5 mg total) by mouth every evening., Disp: 30 tablet, Rfl: 3   levonorgestrel  (MIRENA ) 20 MCG/24HR IUD, Mirena  20 mcg/24 hours (5 yrs) 52 mg intrauterine device  Take 1 device by intrauterine route., Disp: , Rfl:    meloxicam  (MOBIC ) 15 MG tablet, Take 1 tablet (15 mg total) by mouth daily., Disp: 30 tablet, Rfl: 0   methylPREDNISolone  (MEDROL ) 4 MG tablet, Standard taper over 6 days (Patient not taking: Reported on 05/25/2024), Disp: 21  tablet, Rfl: 0   omeprazole  (PRILOSEC) 40 MG capsule, TAKE 1 CAPSULE(40 MG) BY MOUTH DAILY, Disp: 30 capsule, Rfl: 11   omeprazole  (PRILOSEC) 40 MG capsule, TAKE 1 CAPSULE(40 MG) BY MOUTH DAILY, Disp: 30 capsule, Rfl: 11   ondansetron  (ZOFRAN  ODT) 4 MG disintegrating tablet, Take 1 tablet (4 mg total) by mouth every 8 (eight) hours as needed for nausea or vomiting., Disp: 20 tablet, Rfl: 0   Semaglutide -Weight Management (WEGOVY ) 1.7 MG/0.75ML SOAJ, INJECT 1.7 MG INTO THE SKIN SUBCUTANEOUSLY ONCE A WEEK, Disp: 3 mL, Rfl: 1   Semaglutide -Weight Management 0.25 MG/0.5ML SOAJ, Inject 0.25 mg into the skin once a week., Disp: 2 mL, Rfl: 0   Semaglutide -Weight Management 1 MG/0.5ML SOAJ,  Inject 1 mg into the skin once a week., Disp: 2 mL, Rfl: 0   SUMAtriptan  (IMITREX ) 50 MG tablet, Take 1 tablet (50 mg total) by mouth every 2 (two) hours as needed for migraine. May repeat in 2 hours if headache persists or recurs., Disp: 10 tablet, Rfl: 2   topiramate  (TOPAMAX ) 25 MG tablet, 1 tab po  twice daily, Disp: 60 tablet, Rfl: 3   WEGOVY  0.5 MG/0.5ML SOAJ, INJECT 0.5 MG UNDER THE SKIN ONCE WEEKLY, Disp: 2 mL, Rfl: 0   Objective:     There were no vitals filed for this visit.    There is no height or weight on file to calculate BMI.    Physical Exam:    ***   Electronically signed by:  Odis Mace [facility] 7:38 AM 06/21/24

## 2024-06-22 ENCOUNTER — Ambulatory Visit: Admitting: Sports Medicine

## 2024-06-26 ENCOUNTER — Encounter: Payer: Self-pay | Admitting: Sports Medicine

## 2024-07-09 ENCOUNTER — Other Ambulatory Visit: Payer: Self-pay | Admitting: Medical

## 2024-07-28 ENCOUNTER — Encounter (HOSPITAL_COMMUNITY): Payer: Self-pay | Admitting: Internal Medicine

## 2024-07-28 ENCOUNTER — Inpatient Hospital Stay (HOSPITAL_COMMUNITY)
Admission: EM | Admit: 2024-07-28 | Discharge: 2024-07-31 | DRG: 811 | Disposition: A | Discharge status: Home / Self Care | Attending: Internal Medicine | Admitting: Internal Medicine

## 2024-07-28 ENCOUNTER — Other Ambulatory Visit: Payer: Self-pay

## 2024-07-28 DIAGNOSIS — K922 Gastrointestinal hemorrhage, unspecified: Secondary | ICD-10-CM | POA: Diagnosis not present

## 2024-07-28 DIAGNOSIS — E785 Hyperlipidemia, unspecified: Secondary | ICD-10-CM | POA: Diagnosis present

## 2024-07-28 DIAGNOSIS — E559 Vitamin D deficiency, unspecified: Secondary | ICD-10-CM | POA: Diagnosis present

## 2024-07-28 DIAGNOSIS — K921 Melena: Secondary | ICD-10-CM | POA: Diagnosis not present

## 2024-07-28 DIAGNOSIS — Z888 Allergy status to other drugs, medicaments and biological substances status: Secondary | ICD-10-CM

## 2024-07-28 DIAGNOSIS — Z860100 Personal history of colon polyps, unspecified: Secondary | ICD-10-CM

## 2024-07-28 DIAGNOSIS — K2950 Unspecified chronic gastritis without bleeding: Secondary | ICD-10-CM | POA: Diagnosis not present

## 2024-07-28 DIAGNOSIS — Z791 Long term (current) use of non-steroidal anti-inflammatories (NSAID): Secondary | ICD-10-CM

## 2024-07-28 DIAGNOSIS — Z79899 Other long term (current) drug therapy: Secondary | ICD-10-CM | POA: Diagnosis not present

## 2024-07-28 DIAGNOSIS — K250 Acute gastric ulcer with hemorrhage: Principal | ICD-10-CM | POA: Diagnosis present

## 2024-07-28 DIAGNOSIS — J449 Chronic obstructive pulmonary disease, unspecified: Secondary | ICD-10-CM | POA: Diagnosis present

## 2024-07-28 DIAGNOSIS — I10 Essential (primary) hypertension: Secondary | ICD-10-CM | POA: Diagnosis present

## 2024-07-28 DIAGNOSIS — Z8349 Family history of other endocrine, nutritional and metabolic diseases: Secondary | ICD-10-CM | POA: Diagnosis not present

## 2024-07-28 DIAGNOSIS — Z7985 Long-term (current) use of injectable non-insulin antidiabetic drugs: Secondary | ICD-10-CM | POA: Diagnosis not present

## 2024-07-28 DIAGNOSIS — D62 Acute posthemorrhagic anemia: Principal | ICD-10-CM | POA: Diagnosis present

## 2024-07-28 DIAGNOSIS — K259 Gastric ulcer, unspecified as acute or chronic, without hemorrhage or perforation: Secondary | ICD-10-CM | POA: Diagnosis not present

## 2024-07-28 DIAGNOSIS — Z8249 Family history of ischemic heart disease and other diseases of the circulatory system: Secondary | ICD-10-CM

## 2024-07-28 DIAGNOSIS — D509 Iron deficiency anemia, unspecified: Secondary | ICD-10-CM | POA: Diagnosis not present

## 2024-07-28 DIAGNOSIS — K5909 Other constipation: Secondary | ICD-10-CM | POA: Diagnosis present

## 2024-07-28 DIAGNOSIS — K2100 Gastro-esophageal reflux disease with esophagitis, without bleeding: Secondary | ICD-10-CM | POA: Diagnosis present

## 2024-07-28 DIAGNOSIS — M549 Dorsalgia, unspecified: Secondary | ICD-10-CM | POA: Diagnosis not present

## 2024-07-28 DIAGNOSIS — K449 Diaphragmatic hernia without obstruction or gangrene: Secondary | ICD-10-CM | POA: Diagnosis present

## 2024-07-28 DIAGNOSIS — M545 Low back pain, unspecified: Secondary | ICD-10-CM | POA: Diagnosis not present

## 2024-07-28 DIAGNOSIS — Z87891 Personal history of nicotine dependence: Secondary | ICD-10-CM

## 2024-07-28 LAB — COMPREHENSIVE METABOLIC PANEL WITH GFR
ALT: 11 U/L (ref 0–44)
AST: 16 U/L (ref 15–41)
Albumin: 4.1 g/dL (ref 3.5–5.0)
Alkaline Phosphatase: 144 U/L — ABNORMAL HIGH (ref 38–126)
Anion gap: 13 (ref 5–15)
BUN: 17 mg/dL (ref 6–20)
CO2: 20 mmol/L — ABNORMAL LOW (ref 22–32)
Calcium: 8.8 mg/dL — ABNORMAL LOW (ref 8.9–10.3)
Chloride: 103 mmol/L (ref 98–111)
Creatinine, Ser: 1.26 mg/dL — ABNORMAL HIGH (ref 0.44–1.00)
GFR, Estimated: 51 mL/min — ABNORMAL LOW (ref >60)
Glucose, Bld: 102 mg/dL — ABNORMAL HIGH (ref 70–99)
Potassium: 3.8 mmol/L (ref 3.5–5.1)
Sodium: 136 mmol/L (ref 135–145)
Total Bilirubin: 0.4 mg/dL (ref 0.0–1.2)
Total Protein: 6.6 g/dL (ref 6.5–8.1)

## 2024-07-28 LAB — CBC WITH DIFFERENTIAL/PLATELET
Abs Immature Granulocytes: 0.04 K/uL (ref 0.00–0.07)
Basophils Absolute: 0.1 K/uL (ref 0.0–0.1)
Basophils Relative: 1 %
Eosinophils Absolute: 0 K/uL (ref 0.0–0.5)
Eosinophils Relative: 0 %
HCT: 24.3 % — ABNORMAL LOW (ref 36.0–46.0)
Hemoglobin: 6.6 g/dL — CL (ref 12.0–15.0)
Immature Granulocytes: 0 %
Lymphocytes Relative: 11 %
Lymphs Abs: 1.1 K/uL (ref 0.7–4.0)
MCH: 19.1 pg — ABNORMAL LOW (ref 26.0–34.0)
MCHC: 27.2 g/dL — ABNORMAL LOW (ref 30.0–36.0)
MCV: 70.4 fL — ABNORMAL LOW (ref 80.0–100.0)
Monocytes Absolute: 0.8 K/uL (ref 0.1–1.0)
Monocytes Relative: 7 %
Neutro Abs: 8.7 K/uL — ABNORMAL HIGH (ref 1.7–7.7)
Neutrophils Relative %: 81 %
Platelets: 484 K/uL — ABNORMAL HIGH (ref 150–400)
RBC: 3.45 MIL/uL — ABNORMAL LOW (ref 3.87–5.11)
RDW: 19.2 % — ABNORMAL HIGH (ref 11.5–15.5)
WBC: 10.8 K/uL — ABNORMAL HIGH (ref 4.0–10.5)
nRBC: 0.2 % (ref 0.0–0.2)

## 2024-07-28 LAB — POC OCCULT BLOOD, ED: Fecal Occult Bld: POSITIVE — AB

## 2024-07-28 LAB — CK: Total CK: 66 U/L (ref 38–234)

## 2024-07-28 LAB — ABO/RH: ABO/RH(D): O POS

## 2024-07-28 LAB — PREPARE RBC (CROSSMATCH)

## 2024-07-28 MED ORDER — SODIUM CHLORIDE 0.9% IV SOLUTION
Freq: Once | INTRAVENOUS | Status: AC
Start: 2024-07-28 — End: 2024-07-29

## 2024-07-28 MED ORDER — KETOROLAC TROMETHAMINE 30 MG/ML IJ SOLN
15.0000 mg | Freq: Once | INTRAMUSCULAR | Status: AC
  Administered 2024-07-28: 15 mg via INTRAVENOUS
  Filled 2024-07-28: qty 1

## 2024-07-28 MED ORDER — METHOCARBAMOL 1000 MG/10ML IJ SOLN
1000.0000 mg | Freq: Once | INTRAMUSCULAR | Status: AC
  Administered 2024-07-28: 1000 mg via INTRAVENOUS
  Filled 2024-07-28: qty 10

## 2024-07-28 MED ORDER — TOPIRAMATE 25 MG PO TABS
25.0000 mg | ORAL_TABLET | Freq: Two times a day (BID) | ORAL | Status: DC
Start: 2024-07-28 — End: 2024-07-28

## 2024-07-28 MED ORDER — AMLODIPINE BESYLATE 10 MG PO TABS
10.0000 mg | ORAL_TABLET | Freq: Every day | ORAL | Status: DC
  Administered 2024-07-28 – 2024-07-31 (×4): 10 mg via ORAL
  Filled 2024-07-28 (×4): qty 1

## 2024-07-28 MED ORDER — PANTOPRAZOLE SODIUM 40 MG IV SOLR
40.0000 mg | Freq: Two times a day (BID) | INTRAVENOUS | Status: DC
Start: 2024-07-28 — End: 2024-07-31
  Administered 2024-07-28 – 2024-07-31 (×6): 40 mg via INTRAVENOUS
  Filled 2024-07-28 (×6): qty 10

## 2024-07-28 MED ORDER — ATORVASTATIN CALCIUM 10 MG PO TABS
10.0000 mg | ORAL_TABLET | Freq: Every day | ORAL | Status: DC
  Administered 2024-07-28 – 2024-07-31 (×4): 10 mg via ORAL
  Filled 2024-07-28 (×4): qty 1

## 2024-07-28 NOTE — H&P (Incomplete)
 History and Physical    Katie Merritt FMW:[id number] DOB: 04/14/1971 DOA: 07/28/2024  PCP: Dorina Loving, PA-C   Chief Complaint: back pain  HPI: Katie Merritt is a 53 y.o. female with medical history significant of hypertension, hyperlipidemia, COPD who presents emergency department due to back pain.  Patient is been having progressively worsening lower back pain when walking.  She felt a spasm that would not go away.  She had difficulty walking so she called EMS and was brought to the ER for further assessment.  On arrival she was afebrile and hds.  Labs were obtained on presentation which showed creatinine 1.25 baseline, WBC 10.8, hemoglobin 6.6 baseline around 12.  Repeat hemoglobin 6.6.  Patient was given 2 units of blood transfusion and admitted for further workup.  Patient had colonoscopy 2021 which showed 1 polyp Review of Systems: Review of Systems  Constitutional: Negative.   HENT: Negative.    Eyes: Negative.   Respiratory: Negative.    Cardiovascular: Negative.   Gastrointestinal: Negative.   Genitourinary: Negative.   Musculoskeletal: Negative.   Skin: Negative.   Neurological: Negative.   Endo/Heme/Allergies: Negative.   Psychiatric/Behavioral: Negative.       As per HPI otherwise 10 point review of systems negative.   Allergies  Allergen Reactions   Metronidazole  Other (See Comments)    MetroGel  Pt states she doesn't have an allergy to this.    Past Medical History:  Diagnosis Date   Anxiety    COPD (chronic obstructive pulmonary disease) (HCC)    Hyperlipidemia    Hypertension     Past Surgical History:  Procedure Laterality Date   Leap     TUBAL LIGATION       reports that she quit smoking about 10 years ago. Her smoking use included cigarettes. She has never used smokeless tobacco. She reports current alcohol use. She reports that she does not currently use drugs.  Family History  Problem Relation Age of Onset   Hyperlipidemia Mother     Hypertension Mother    Colon cancer Neg Hx    Colon polyps Neg Hx    Esophageal cancer Neg Hx    Rectal cancer Neg Hx    Stomach cancer Neg Hx     Prior to Admission medications   Medication Sig Start Date End Date Taking? Authorizing Provider  amLODipine  (NORVASC ) 10 MG tablet Take 1 tablet (10 mg total) by mouth daily. 05/04/24  Yes Saguier, Loving, PA-C  atorvastatin  (LIPITOR) 10 MG tablet Take 1 tablet (10 mg total) by mouth daily. 02/29/24  Yes Saguier, Loving, PA-C  omeprazole  (PRILOSEC) 40 MG capsule TAKE 1 CAPSULE(40 MG) BY MOUTH DAILY 08/31/21  Yes Saguier, Loving, PA-C  SUMAtriptan  (IMITREX ) 50 MG tablet Take 1 tablet (50 mg total) by mouth every 2 (two) hours as needed for migraine. May repeat in 2 hours if headache persists or recurs. 05/04/24  Yes Saguier, Loving, PA-C  topiramate  (TOPAMAX ) 25 MG tablet 1 tab po  twice daily 07/29/23  Yes Saguier, Loving, PA-C  levonorgestrel  (MIRENA ) 20 MCG/24HR IUD Mirena  20 mcg/24 hours (5 yrs) 52 mg intrauterine device  Take 1 device by intrauterine route.    [provider]    Physical Exam: Vitals:   07/28/24 1640  BP: (!) 146/94  Pulse: (!) 101  Resp: 16  Temp: 97.9 F (36.6 C)  TempSrc: Oral  SpO2: 100%   Physical Exam Constitutional:      Appearance: She is normal weight.  HENT:  Head: Normocephalic.     Nose: Nose normal.     Mouth/Throat:     Mouth: Mucous membranes are moist.     Pharynx: Oropharynx is clear.  Eyes:     Conjunctiva/sclera: Conjunctivae normal.     Pupils: Pupils are equal, round, and reactive to light.  Cardiovascular:     Rate and Rhythm: Normal rate and regular rhythm.     Pulses: Normal pulses.     Heart sounds: Normal heart sounds.  Pulmonary:     Effort: Pulmonary effort is normal.     Breath sounds: Normal breath sounds.  Abdominal:     General: Abdomen is flat. Bowel sounds are normal.  Musculoskeletal:        General: Normal range of motion.     Cervical back: Normal range of  motion.  Skin:    General: Skin is warm.     Capillary Refill: Capillary refill takes less than 2 seconds.  Neurological:     General: No focal deficit present.     Mental Status: She is alert. Mental status is at baseline.  Psychiatric:        Mood and Affect: Mood normal.       Labs on Admission: I have personally reviewed the patients's labs and imaging studies.  Assessment/Plan Principal Problem:   Gastrointestinal hemorrhage   # Acute anemia, unclear etiology - Patient found to have hemoglobin 6.6 - Colonoscopy 2021 unrevealing - No history of NSAIDs  Plan: Twice daily PPI GI consulted in emergency department can I have labs  # Hypertension-continue amlodipine   # Hyperlipidemia-continue Lipitor  Admission status: Inpatient Telemetry  Certification: The appropriate patient status for this patient is INPATIENT. Inpatient status is judged to be reasonable and necessary in order to provide the required intensity of service to ensure the patient's safety. The patient's presenting symptoms, physical exam findings, and initial radiographic and laboratory data in the context of their chronic comorbidities is felt to place them at high risk for further clinical deterioration. Furthermore, it is not anticipated that the patient will be medically stable for discharge from the hospital within 2 midnights of admission.   * I certify that at the point of admission it is my clinical judgment that the patient will require inpatient hospital care spanning beyond 2 midnights from the point of admission due to high intensity of service, high risk for further deterioration and high frequency of surveillance required.DEWAINE Lamar Dess MD [facility] If 7PM-7AM, please contact night-coverage [email or web address]  07/28/2024, 7:51 PM

## 2024-07-28 NOTE — ED Triage Notes (Signed)
 Patient BIB EMS, sudden onset lower back pain  Radiating around abdomen and down back of legs, burning sensation Patient reported back locked up while out shopping with daughter Unable to walk or move Pain rated 5/10 Received 200 mcg fentanyl en route

## 2024-07-28 NOTE — ED Provider Notes (Signed)
 [place] EMERGENCY DEPARTMENT AT [facility] Provider Note   CSN: [id number] Arrival date & time: 07/28/24  1629     Patient presents with: Back Pain   Katie Merritt is a 53 y.o. female.   This is a 53 year old female who presents with acute onset of bilateral lower leg pain which radiated to her lower back when she was walking today.  Denies any distal numbness or tingling to her legs.  Does have a history of lumbar disc disease.  States that she felt a spasm and it was made her pass out.  No associate headache, chest pain, abdominal discomfort.  No bowel or bladder dysfunction.  Finding difficult to stand and she called EMS.  Was given 200 mcg of fentanyl according to the paramedic I spoke with.  Patient notes significant improvement of her symptoms.  Denies any recent history of trauma       Prior to Admission medications   Medication Sig Start Date End Date Taking? Authorizing Provider  ALPRAZolam  (XANAX ) 0.25 MG tablet TAKE 1 TABLET BY MOUTH EVERY DAY AS NEEDED FOR ANXIETY 07/10/20   Saguier, [place], PA-C  ALPRAZolam  (XANAX ) 0.5 MG tablet 1 tab po q day prn anxiety 02/20/21   Saguier, [place], PA-C  amLODipine  (NORVASC ) 10 MG tablet Take 1 tablet (10 mg total) by mouth daily. 05/04/24   Saguier, [place], PA-C  atorvastatin  (LIPITOR) 10 MG tablet Take 1 tablet (10 mg total) by mouth daily. 02/29/24   Saguier, [place], PA-C  atorvastatin  (LIPITOR) 10 MG tablet Take 1 tablet (10 mg total) by mouth daily. 03/01/24   Saguier, [place], PA-C  atorvastatin  (LIPITOR) 20 MG tablet Take 1 tablet (20 mg total) by mouth daily. 08/17/22   Saguier, [place], PA-C  buPROPion  (WELLBUTRIN  XL) 150 MG 24 hr tablet TAKE 1 TABLET(150 MG) BY MOUTH DAILY 11/01/22   Saguier, [place], PA-C  busPIRone  (BUSPAR ) 15 MG tablet Take 1 tablet (15 mg total) by mouth 2 (two) times daily. 01/25/23   Saguier, [place], PA-C  clindamycin  (CLINDAGEL) 1 % gel Apply topically 2 (two) times daily. 07/17/21   Saguier, [place], PA-C   cyclobenzaprine  (FLEXERIL ) 5 MG tablet 1 tab po qs prn trapezius pain 11/30/23   Saguier, [place], PA-C  cyclobenzaprine  (FLEXERIL ) 5 MG tablet Take 1 tablet (5 mg total) by mouth at bedtime. Patient not taking: Reported on 05/25/2024 02/17/24   Saguier, [place], PA-C  escitalopram  (LEXAPRO ) 10 MG tablet Take 1 tablet (10 mg total) by mouth daily. 01/25/23   Saguier, [place], PA-C  fluticasone  (FLONASE ) 50 MCG/ACT nasal spray Place 2 sprays into both nostrils daily. 02/27/21   Saguier, [place], PA-C  hydrOXYzine  (ATARAX ) 10 MG tablet 1-2 tab po tid prn anxiety 08/13/22   Saguier, [place], PA-C  levocetirizine (XYZAL ) 5 MG tablet Take 1 tablet (5 mg total) by mouth every evening. 02/27/21   Saguier, [place], PA-C  levonorgestrel  (MIRENA ) 20 MCG/24HR IUD Mirena  20 mcg/24 hours (5 yrs) 52 mg intrauterine device  Take 1 device by intrauterine route.    [provider]  meloxicam  (MOBIC ) 15 MG tablet Take 1 tablet (15 mg total) by mouth daily. 05/25/24   Leonce Katz, DO  methylPREDNISolone  (MEDROL ) 4 MG tablet Standard taper over 6 days Patient not taking: Reported on 05/25/2024 02/17/24   Saguier, [place], PA-C  omeprazole  (PRILOSEC) 40 MG capsule TAKE 1 CAPSULE(40 MG) BY MOUTH DAILY 08/31/21   Saguier, [place], PA-C  omeprazole  (PRILOSEC) 40 MG capsule TAKE 1 CAPSULE(40 MG) BY MOUTH DAILY 07/10/24  Saguier, [place], PA-C  ondansetron  (ZOFRAN  ODT) 4 MG disintegrating tablet Take 1 tablet (4 mg total) by mouth every 8 (eight) hours as needed for nausea or vomiting. 06/20/20   Saguier, [place], PA-C  Semaglutide -Weight Management (WEGOVY ) 1.7 MG/0.75ML SOAJ INJECT 1.7 MG INTO THE SKIN SUBCUTANEOUSLY ONCE A WEEK 11/15/23   Saguier, [place], PA-C  Semaglutide -Weight Management 0.25 MG/0.5ML SOAJ Inject 0.25 mg into the skin once a week. 02/17/24   Saguier, [place], PA-C  Semaglutide -Weight Management 1 MG/0.5ML SOAJ Inject 1 mg into the skin once a week. 04/15/23   Saguier, [place], PA-C  SUMAtriptan  (IMITREX ) 50 MG tablet  Take 1 tablet (50 mg total) by mouth every 2 (two) hours as needed for migraine. May repeat in 2 hours if headache persists or recurs. 05/04/24   Saguier, [place], PA-C  topiramate  (TOPAMAX ) 25 MG tablet 1 tab po  twice daily 07/29/23   Saguier, [place], PA-C  WEGOVY  0.5 MG/0.5ML SOAJ INJECT 0.5 MG UNDER THE SKIN ONCE WEEKLY 03/20/24   Saguier, [place], PA-C    Allergies: Metronidazole     Review of Systems  All other systems reviewed and are negative.   Updated Vital Signs BP (!) 146/94 (BP Location: Right Arm)   Pulse (!) 101   Temp 97.9 F (36.6 C) (Oral)   Resp 16   SpO2 100%   Physical Exam Vitals and nursing note reviewed.  Constitutional:      General: She is not in acute distress.    Appearance: Normal appearance. She is well-developed. She is not toxic-appearing.  HENT:     Head: Normocephalic and atraumatic.  Eyes:     General: Lids are normal.     Conjunctiva/sclera: Conjunctivae normal.     Pupils: Pupils are equal, round, and reactive to light.  Neck:     Thyroid : No thyroid  mass.     Trachea: No tracheal deviation.  Cardiovascular:     Rate and Rhythm: Normal rate and regular rhythm.     Heart sounds: Normal heart sounds. No murmur heard.    No gallop.  Pulmonary:     Effort: Pulmonary effort is normal. No respiratory distress.     Breath sounds: Normal breath sounds. No stridor. No decreased breath sounds, wheezing, rhonchi or rales.  Abdominal:     General: There is no distension.     Palpations: Abdomen is soft.     Tenderness: There is no abdominal tenderness. There is no rebound.  Musculoskeletal:        General: No tenderness. Normal range of motion.     Cervical back: Normal range of motion and neck supple.  Skin:    General: Skin is warm and dry.     Findings: No abrasion or rash.  Neurological:     Mental Status: She is alert and oriented to person, place, and time. Mental status is at baseline.     GCS: GCS eye subscore is 4. GCS verbal subscore is  5. GCS motor subscore is 6.     Cranial Nerves: No cranial nerve deficit.     Sensory: No sensory deficit.     Motor: Motor function is intact.     Comments: Strength is 5 of 5 bilateral lower extremities  Psychiatric:        Attention and Perception: Attention normal.        Speech: Speech normal.        Behavior: Behavior normal.     (all labs ordered are listed, but only abnormal results are  displayed) Labs Reviewed - No data to display  EKG: None  Radiology: No results found.   Procedures   Medications Ordered in the ED  ketorolac (TORADOL) 30 MG/ML injection 15 mg (has no administration in time range)  methocarbamol (ROBAXIN) injection 1,000 mg (has no administration in time range)                                    Medical Decision Making Amount and/or Complexity of Data Reviewed Labs: ordered.  Risk Prescription drug management.   Patient received Toradol and Robaxin prior to results of hemoglobin which came back low at 6.6.  Stool is guaiac positive.  Patient states that she had a colonoscopy done 3 years ago which was unremarkable.  When questioned more, patient states that she has been feeling more weak and dizzy and lightheaded.  2 units of packed red blood cells have been ordered.  Secure chat message sent to Dr. Rollin who is on-call for  GI.  Will admit to the hospitalist team     Final diagnoses:  None    ED Discharge Orders     None          Dasie Faden, MD 07/28/24 1905

## 2024-07-29 ENCOUNTER — Encounter (HOSPITAL_COMMUNITY): Payer: Self-pay | Admitting: Internal Medicine

## 2024-07-29 DIAGNOSIS — K922 Gastrointestinal hemorrhage, unspecified: Secondary | ICD-10-CM | POA: Diagnosis not present

## 2024-07-29 LAB — HEMOGLOBIN AND HEMATOCRIT, BLOOD
HCT: 34.1 % — ABNORMAL LOW (ref 36.0–46.0)
HCT: 33.1 % — ABNORMAL LOW (ref 36.0–46.0)
Hemoglobin: 10.8 g/dL — ABNORMAL LOW (ref 12.0–15.0)
Hemoglobin: 10.3 g/dL — ABNORMAL LOW (ref 12.0–15.0)

## 2024-07-29 LAB — BASIC METABOLIC PANEL WITH GFR
Anion gap: 11 (ref 5–15)
BUN: 15 mg/dL (ref 6–20)
CO2: 22 mmol/L (ref 22–32)
Calcium: 9.1 mg/dL (ref 8.9–10.3)
Chloride: 106 mmol/L (ref 98–111)
Creatinine, Ser: 1.04 mg/dL — ABNORMAL HIGH (ref 0.44–1.00)
GFR, Estimated: >60 mL/min (ref >60)
Glucose, Bld: 97 mg/dL (ref 70–99)
Potassium: 3.6 mmol/L (ref 3.5–5.1)
Sodium: 139 mmol/L (ref 135–145)

## 2024-07-29 LAB — PHOSPHORUS: Phosphorus: 3.9 mg/dL (ref 2.5–4.6)

## 2024-07-29 LAB — CBC
HCT: 31.5 % — ABNORMAL LOW (ref 36.0–46.0)
Hemoglobin: 10 g/dL — ABNORMAL LOW (ref 12.0–15.0)
MCH: 23.4 pg — ABNORMAL LOW (ref 26.0–34.0)
MCHC: 31.7 g/dL (ref 30.0–36.0)
MCV: 73.8 fL — ABNORMAL LOW (ref 80.0–100.0)
Platelets: 436 K/uL — ABNORMAL HIGH (ref 150–400)
RBC: 4.27 MIL/uL (ref 3.87–5.11)
RDW: 22 % — ABNORMAL HIGH (ref 11.5–15.5)
WBC: 9.2 K/uL (ref 4.0–10.5)
nRBC: 0.2 % (ref 0.0–0.2)

## 2024-07-29 LAB — IRON AND TIBC
Iron: 66 ug/dL (ref 28–170)
Saturation Ratios: 11 % (ref 10.4–31.8)
TIBC: 575 ug/dL — ABNORMAL HIGH (ref 250–450)
UIBC: 510 ug/dL

## 2024-07-29 LAB — HIV ANTIBODY (ROUTINE TESTING W REFLEX): HIV Screen 4th Generation wRfx: NONREACTIVE

## 2024-07-29 LAB — MAGNESIUM: Magnesium: 2.2 mg/dL (ref 1.7–2.4)

## 2024-07-29 LAB — VITAMIN B12: Vitamin B-12: 722 pg/mL (ref 180–914)

## 2024-07-29 LAB — FOLATE: Folate: 10 ng/mL (ref >5.9)

## 2024-07-29 LAB — VITAMIN D 25 HYDROXY (VIT D DEFICIENCY, FRACTURES): Vit D, 25-Hydroxy: 14.46 ng/mL — ABNORMAL LOW (ref 30–100)

## 2024-07-29 MED ORDER — ONDANSETRON HCL 4 MG/2ML IJ SOLN: 4.0000 mg | Freq: Four times a day (QID) | INTRAMUSCULAR | Status: DC | PRN

## 2024-07-29 MED ORDER — VITAMIN D (ERGOCALCIFEROL) 1.25 MG (50000 UNIT) PO CAPS
50000.0000 [IU] | ORAL_CAPSULE | ORAL | Status: DC
Start: 2024-07-30 — End: 2024-07-31
  Administered 2024-07-30: 50000 [IU] via ORAL
  Filled 2024-07-29 (×2): qty 1

## 2024-07-29 MED ORDER — MELATONIN 5 MG PO TABS
5.0000 mg | ORAL_TABLET | Freq: Every evening | ORAL | Status: DC | PRN
  Administered 2024-07-29 – 2024-07-30 (×2): 5 mg via ORAL
  Filled 2024-07-29 (×2): qty 1

## 2024-07-29 MED ORDER — ACETAMINOPHEN 325 MG PO TABS: 650.0000 mg | ORAL_TABLET | Freq: Four times a day (QID) | ORAL | Status: DC | PRN

## 2024-07-29 NOTE — Progress Notes (Signed)
 [facility] Progress Note  Patient: Katie Merritt    FMW:[id number]  DOA: 07/28/2024     Date of Service: the patient was seen and examined on 07/29/2024  Chief Complaint  Patient presents with   Back Pain   Brief hospital course: JAILANI HOGANS is a 53 y.o. female with medical history significant of hypertension, hyperlipidemia, COPD who presents emergency department due to back pain.  Patient is been having progressively worsening lower back pain when walking.  She felt a spasm that would not go away.  She had difficulty walking so she called EMS and was brought to the ER for further assessment.  On arrival she was afebrile and hds.  Labs were obtained on presentation which showed creatinine 1.25 baseline, WBC 10.8, hemoglobin 6.6 baseline around 12.  Repeat hemoglobin 6.6.  Patient was given 2 units of blood transfusion and admitted for further workup. On admission back pain had resolved and patient was at baseline.    Patient had colonoscopy 2021 which showed 1 polyp she states that she is had dark stools intermittently for the last several weeks.  She endorses very infrequent Aleve usage.   Assessment and Plan:  # Acute GI bleeding # Acute blood loss anemia Patient presented with melena for a few weeks. 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   Hb 6.6 on admission, received 2 units of PRBC transfusion. Plan: Twice daily PPI Continue clear liquid diet Hb 6.6 >>10.8 Monitor H&H GI consulted, recommended EGD tomorrow a.m.     # Hypertension-continue amlodipine    # Hyperlipidemia-continue Lipitor   Body mass index is 33.79 kg/m.  Interventions:   Diet: CLD DVT Prophylaxis: SCDs  Advance goals of care discussion: Full code  Family Communication: family was not present at bedside, at the  time of interview.  The pt provided permission to discuss medical plan with the family. Opportunity was given to ask question and all questions were answered satisfactorily.   Disposition:  Pt is from home, admitted with GI bleeding, still has risk of bleeding and pending EGD, which precludes a safe discharge. Discharge to home, when cleared by GI, EGD has been scheduled for tomorrow a.m..  Subjective: No significant events overnight.  Patient denies any abdominal pain, no nausea vomiting.  On presentation patient had some dizziness and generalized weakness which has improved after PRBC transfusion.  Physical Exam: General: NAD, lying comfortably Appear in no distress, affect appropriate Eyes: PERRLA ENT: Oral Mucosa Clear, moist  Neck: no JVD,  Cardiovascular: S1 and S2 Present, no Murmur,  Respiratory: good respiratory effort, Bilateral Air entry equal and Decreased, no Crackles, no wheezes Abdomen: Bowel Sound present, Soft and no tenderness,  Skin: no rashes Extremities: no Pedal edema, no calf tenderness Neurologic: without any new focal findings Gait not checked due to patient safety concerns  Vitals:   07/29/24 0026 07/29/24 0300 07/29/24 0534 07/29/24 1054  BP: (!) 155/104 (!) 147/91 (!) 136/91 (!) 164/103  Pulse: 82 82 78 73  Resp:  18 20 18   Temp: 98.1 F (36.7 C) 97.9 F (36.6 C) 98.1 F (36.7 C) 98 F (36.7 C)  TempSrc: Oral     SpO2: 100% 100% 95% 100%  Weight:      Height:        Intake/Output Summary (  Last 24 hours) at 07/29/2024 1533 Last data filed at 07/29/2024 0258 Gross per 24 hour  Intake 1026.58 ml  Output --  Net 1026.58 ml   Filed Weights   07/28/24 2101  Weight: 92.1 kg    Data Reviewed: I have personally reviewed and interpreted daily labs, tele strips, imagings as discussed above. I reviewed all nursing notes, pharmacy notes, vitals, pertinent old records I have discussed plan of care as described above with RN and  patient/family.  CBC: Recent Labs  Lab 07/28/24 1737 07/29/24 0524 07/29/24 1242  WBC 10.8* 9.2  --   NEUTROABS 8.7*  --   --   HGB 6.6* 10.0* 10.8*  HCT 24.3* 31.5* 34.1*  MCV 70.4* 73.8*  --   PLT 484* 436*  --    Basic Metabolic Panel: Recent Labs  Lab 07/28/24 1656 07/29/24 0524  NA 136 139  K 3.8 3.6  CL 103 106  CO2 20* 22  GLUCOSE 102* 97  BUN 17 15  CREATININE 1.26* 1.04*  CALCIUM  8.8* 9.1  MG  --  2.2  PHOS  --  3.9    Studies: No results found.  Scheduled Meds:  amLODipine   10 mg Oral Daily   atorvastatin   10 mg Oral Daily   pantoprazole (PROTONIX) IV  40 mg Intravenous Q12H   Continuous Infusions: PRN Meds: acetaminophen, ondansetron  (ZOFRAN ) IV  Time spent: 35 minutes  Author: ELVAN SOR. MD [facility] 07/29/2024 3:33 PM  To reach On-call, see care teams to locate the attending and reach out to them via [email or web address]. If 7PM-7AM, please contact night-coverage If you still have difficulty reaching the attending provider, please page the [facility] (Director on Call) for [facility] on amion for assistance.

## 2024-07-29 NOTE — Plan of Care (Signed)

## 2024-07-29 NOTE — Consult Note (Addendum)
 [facility]OSS [facility]OVER [facility] Reason for Consult:Severe anemia. Referring Physician: THP  Katie Merritt is an 53 y.o. female.  HPI: Katie Merritt is a 53 year old black female with multiple medical problems listed below who presented emergency room with lower leg pain that almost made her pass out when she called EMS yesterday. She was out shopping with her daughter when her symptoms became worse suddenly this was associated intense abdominal pain. She was given 200 mg of Fentanyl by the EMS and the symptoms improved. For the last 3 weeks she been noticing black-colored stools but denies having any bright red bleeding per rectum. She has a history of chronic constipation and her last colonoscopy done on 06/27/2020 revealed a small polyp that was removed by cold snare. For the last 3 weeks she has been having progressive back pain she also has a history of lumbar disc disease. In the ER she was noted to have a creatinine of 1.25 with hemoglobin of 6.6 g/dL appears to drop from baseline of 12 g/dL.  She was given 2 units of packed red blood cells.  She gives a history of drinking 1 bottle of wine per night 2-3 times a week and gives a history of heavy alcohol use for the last 5 years where she was drinking 3-4 shots of liquor on the weekends 3 times a week. She says for the last 1 year she has only been drinking wine as mentioned above. She denies any nonsteroidals except for occasional Aleve once or twice a month. She used to use BC powders when she had severe migraine headaches several years ago but does not but does not do so now.  She denies a previous history of peptic ulcer disease.  Past Medical History:  Diagnosis Date   Anxiety    COPD (chronic obstructive pulmonary disease) (HCC)    Hyperlipidemia    Hypertension    Past Surgical History:  Procedure Laterality Date   Leap     TUBAL LIGATION     Family History  Problem Relation Age of Onset   Hyperlipidemia Mother    Hypertension Mother    Colon  cancer Neg Hx    Colon polyps Neg Hx    Esophageal cancer Neg Hx    Rectal cancer Neg Hx    Stomach cancer Neg Hx    Social History:  reports that she quit smoking about 10 years ago. Her smoking use included cigarettes. She has never used smokeless tobacco. She reports current alcohol use. She reports that she does not currently use drugs.  Allergies:  Allergies  Allergen Reactions   Metronidazole  Other (See Comments)    MetroGel  Pt states she doesn't have an allergy to this.   Medications: I have reviewed the patient's current medications. Prior to Admission:  Medications Prior to Admission  Medication Sig Dispense Refill Last Dose/Taking   amLODipine  (NORVASC ) 10 MG tablet Take 1 tablet (10 mg total) by mouth daily. 90 tablet 3 07/27/2024 Morning   atorvastatin  (LIPITOR) 10 MG tablet Take 1 tablet (10 mg total) by mouth daily. 90 tablet 3 07/27/2024 Evening   omeprazole  (PRILOSEC) 40 MG capsule TAKE 1 CAPSULE(40 MG) BY MOUTH DAILY 30 capsule 11 07/27/2024 Morning   SUMAtriptan  (IMITREX ) 50 MG tablet Take 1 tablet (50 mg total) by mouth every 2 (two) hours as needed for migraine. May repeat in 2 hours if headache persists or recurs. 10 tablet 2 Unknown   topiramate  (TOPAMAX ) 25 MG tablet 1 tab po  twice daily 60  tablet 3 Unknown   levonorgestrel  (MIRENA ) 20 MCG/24HR IUD Mirena  20 mcg/24 hours (5 yrs) 52 mg intrauterine device  Take 1 device by intrauterine route.   Unknown   Scheduled:  amLODipine   10 mg Oral Daily   atorvastatin   10 mg Oral Daily   pantoprazole (PROTONIX) IV  40 mg Intravenous Q12H   Continuous: EMW:[id number], ondansetron  (ZOFRAN ) IV  Results for orders placed or performed during the hospital encounter of 07/28/24 (from the past 48 hours)  Comprehensive metabolic panel with GFR     Status: Abnormal   Collection Time: 07/28/24  4:56 PM  Result Value Ref Range   Sodium 136 135 - 145 mmol/L   Potassium 3.8 3.5 - 5.1 mmol/L   Chloride 103 98 - 111 mmol/L    CO2 20 (L) 22 - 32 mmol/L   Glucose, Bld 102 (H) 70 - 99 mg/dL    Comment: Glucose reference range applies only to samples taken after fasting for at least 8 hours.   BUN 17 6 - 20 mg/dL   Creatinine, Ser 8.73 (H) 0.44 - 1.00 mg/dL   Calcium  8.8 (L) 8.9 - 10.3 mg/dL   Total Protein 6.6 6.5 - 8.1 g/dL   Albumin 4.1 3.5 - 5.0 g/dL   AST 16 15 - 41 U/L   ALT 11 0 - 44 U/L   Alkaline Phosphatase 144 (H) 38 - 126 U/L   Total Bilirubin 0.4 0.0 - 1.2 mg/dL   GFR, Estimated 51 (L) >60 mL/min    Comment: (NOTE) Calculated using the CKD-EPI Creatinine Equation (2021)    Anion gap 13 5 - 15    Comment: Performed at [facility], [address] [place], [place] [address]  CK     Status: None   Collection Time: 07/28/24  4:56 PM  Result Value Ref Range   Total CK 66 38 - 234 U/L    Comment: Performed at [facility], [address] [place], [place] [address]  CBC with Differential/Platelet     Status: Abnormal   Collection Time: 07/28/24  5:37 PM  Result Value Ref Range   WBC 10.8 (H) 4.0 - 10.5 K/uL   RBC 3.45 (L) 3.87 - 5.11 MIL/uL   Hemoglobin 6.6 (LL) 12.0 - 15.0 g/dL    Comment: REPEATED TO VERIFY Reticulocyte Hemoglobin testing may be clinically indicated, consider ordering this additional test [id number] This critical result has been called to KIRT HUNT RN by Lavetta Ding on 07/28/2024 17:50:41, and has been read back.    HCT 24.3 (L) 36.0 - 46.0 %   MCV 70.4 (L) 80.0 - 100.0 fL   MCH 19.1 (L) 26.0 - 34.0 pg   MCHC 27.2 (L) 30.0 - 36.0 g/dL   RDW 80.7 (H) 88.4 - 84.4 %   Platelets 484 (H) 150 - 400 K/uL   nRBC 0.2 0.0 - 0.2 %   Neutrophils Relative % 81 %   Neutro Abs 8.7 (H) 1.7 - 7.7 K/uL   Lymphocytes Relative 11 %   Lymphs Abs 1.1 0.7 - 4.0 K/uL   Monocytes Relative 7 %   Monocytes Absolute 0.8 0.1 - 1.0 K/uL   Eosinophils Relative 0 %   Eosinophils Absolute 0.0 0.0 - 0.5 K/uL   Basophils Relative 1 %   Basophils Absolute  0.1 0.0 - 0.1 K/uL   Immature Granulocytes 0 %   Abs Immature Granulocytes 0.04 0.00 - 0.07 K/uL    Comment: Performed at [facility], [address], [place], [place] [address]  Type and screen     Status: None (Preliminary result)   Collection Time: 07/28/24  6:14 PM  Result Value Ref Range   ABO/RH(D) O POS    Antibody Screen NEG    Sample Expiration 07/31/2024,2359    Unit Number [id number]    Blood Component Type RED CELLS,LR    Unit division 00    Status of Unit ISSUED    Transfusion Status OK TO TRANSFUSE    Crossmatch Result      Compatible Performed at [facility], [address] [place], [place] [address]    Unit Number [id number]    Blood Component Type RED CELLS,LR    Unit division 00    Status of Unit ISSUED    Transfusion Status OK TO TRANSFUSE    Crossmatch Result Compatible   Prepare RBC (crossmatch)     Status: None   Collection Time: 07/28/24  6:14 PM  Result Value Ref Range   Order Confirmation      ORDER PROCESSED BY BLOOD BANK Performed at [facility], [address], [place], [place] [address]   ABO/Rh     Status: None   Collection Time: 07/28/24  6:14 PM  Result Value Ref Range   ABO/RH(D)      O POS Performed at [facility], [address] [place], [place] [address]   POC occult blood, ED RN will collect     Status: Abnormal   Collection Time: 07/28/24  6:53 PM  Result Value Ref Range   Fecal Occult Bld POSITIVE (A) NEGATIVE  HIV Antibody (routine testing w rflx)     Status: None   Collection Time: 07/28/24  8:14 PM  Result Value Ref Range   HIV Screen 4th Generation wRfx Non Reactive Non Reactive    Comment: Performed at [facility], [address] [place], [place] [address]  Basic metabolic panel     Status: Abnormal   Collection Time: 07/29/24  5:24 AM  Result Value Ref Range   Sodium 139 135 - 145 mmol/L   Potassium 3.6 3.5 - 5.1 mmol/L    Chloride 106 98 - 111 mmol/L   CO2 22 22 - 32 mmol/L   Glucose, Bld 97 70 - 99 mg/dL    Comment: Glucose reference range applies only to samples taken after fasting for at least 8 hours.   BUN 15 6 - 20 mg/dL   Creatinine, Ser 8.95 (H) 0.44 - 1.00 mg/dL   Calcium  9.1 8.9 - 10.3 mg/dL   GFR, Estimated >39 >39 mL/min    Comment: (NOTE) Calculated using the CKD-EPI Creatinine Equation (2021)    Anion gap 11 5 - 15    Comment: Performed at [facility], [address], [place], [place] [address]  CBC     Status: Abnormal   Collection Time: 07/29/24  5:24 AM  Result Value Ref Range   WBC 9.2 4.0 - 10.5 K/uL   RBC 4.27 3.87 - 5.11 MIL/uL   Hemoglobin 10.0 (L) 12.0 - 15.0 g/dL    Comment: POST TRANSFUSION SPECIMEN   HCT 31.5 (L) 36.0 - 46.0 %   MCV 73.8 (L) 80.0 - 100.0 fL   MCH 23.4 (L) 26.0 - 34.0 pg   MCHC 31.7 30.0 - 36.0 g/dL   RDW 77.9 (H) 88.4 - 84.4 %   Platelets 436 (H) 150 - 400 K/uL   nRBC  0.2 0.0 - 0.2 %    Comment: Performed at [facility], [address], [place], [place] [address]  Review of Systems  Constitutional:  Positive for activity change and fatigue. Negative for appetite change, chills, diaphoresis, fever and unexpected weight change.  HENT: Negative.    Eyes: Negative.   Respiratory: Negative.    Cardiovascular: Negative.   Gastrointestinal:  Positive for abdominal pain, anal bleeding, blood in stool and constipation. Negative for abdominal distention, diarrhea, nausea, rectal pain and vomiting.  Genitourinary: Negative.   Allergic/Immunologic: Negative.   Neurological: Negative.   Hematological: Negative.   Psychiatric/Behavioral: Negative.     Blood pressure (!) 136/91, pulse 78, temperature 98.1 F (36.7 C), resp. rate 20, height 5' 5 (1.651 m), weight 92.1 kg, SpO2 95%. Physical Exam Vitals reviewed.  Constitutional:      General: She is not in acute distress.    Appearance: She is not ill-appearing,  toxic-appearing or diaphoretic.  HENT:     Head: Normocephalic and atraumatic.     Mouth/Throat:     Mouth: Mucous membranes are dry.  Eyes:     Extraocular Movements: Extraocular movements intact.     Pupils: Pupils are equal, round, and reactive to light.  Cardiovascular:     Rate and Rhythm: Normal rate and regular rhythm.     Pulses: Normal pulses.     Heart sounds: Normal heart sounds.  Pulmonary:     Effort: Pulmonary effort is normal.     Breath sounds: Normal breath sounds.  Abdominal:     General: There is no distension.     Palpations: Abdomen is soft. There is no mass.     Tenderness: There is no abdominal tenderness. There is no guarding or rebound.     Hernia: No hernia is present.  Musculoskeletal:     Cervical back: Normal range of motion and neck supple.  Skin:    General: Skin is warm and dry.  Neurological:     General: No focal deficit present.     Mental Status: She is alert and oriented to person, place, and time.  Psychiatric:        Mood and Affect: Mood normal.        Behavior: Behavior normal.   Assessment/Plan: 1) Severe anemia with melenic stools for the last 3 weeks-patient will be posted for an EGD tomorrow agree with IV PPI's for now.  2) Chronic constipation. 3) History of colonic polyp. 4) Hypertension. 3) Hyperlipidemia.  Katie Merritt 07/29/2024, 8:03 AM

## 2024-07-29 NOTE — Plan of Care (Signed)
  Problem: Education: Goal: Knowledge of General Education information will improve Description: Including pain rating scale, medication(s)/side effects and non-pharmacologic comfort measures Outcome: Progressing   Problem: Health Behavior/Discharge Planning: Goal: Ability to manage health-related needs will improve Outcome: Progressing   Problem: Clinical Measurements: Goal: Will remain free from infection Outcome: Progressing Goal: Cardiovascular complication will be avoided Outcome: Progressing   Problem: Activity: Goal: Risk for activity intolerance will decrease Outcome: Progressing   Problem: Elimination: Goal: Will not experience complications related to bowel motility Outcome: Progressing Goal: Will not experience complications related to urinary retention Outcome: Progressing   Problem: Pain Managment: Goal: General experience of comfort will improve and/or be controlled Outcome: Progressing   Problem: Safety: Goal: Ability to remain free from injury will improve Outcome: Progressing   Problem: Skin Integrity: Goal: Risk for impaired skin integrity will decrease Outcome: Progressing

## 2024-07-30 ENCOUNTER — Encounter: Payer: Self-pay | Admitting: Medical

## 2024-07-30 DIAGNOSIS — K921 Melena: Secondary | ICD-10-CM | POA: Diagnosis not present

## 2024-07-30 DIAGNOSIS — D62 Acute posthemorrhagic anemia: Secondary | ICD-10-CM | POA: Diagnosis not present

## 2024-07-30 LAB — BPAM RBC
Blood Product Expiration Date: 202511032359
Blood Product Expiration Date: 202511032359
ISSUE DATE / TIME: 202510041935
ISSUE DATE / TIME: 202510042353
Unit Type and Rh: 5100
Unit Type and Rh: 5100

## 2024-07-30 LAB — HEPATIC FUNCTION PANEL
ALT: 13 U/L (ref 0–44)
AST: 18 U/L (ref 15–41)
Albumin: 4 g/dL (ref 3.5–5.0)
Alkaline Phosphatase: 137 U/L — ABNORMAL HIGH (ref 38–126)
Bilirubin, Direct: 0.3 mg/dL — ABNORMAL HIGH (ref 0.0–0.2)
Indirect Bilirubin: 0.6 mg/dL (ref 0.3–0.9)
Total Bilirubin: 0.9 mg/dL (ref 0.0–1.2)
Total Protein: 6.4 g/dL — ABNORMAL LOW (ref 6.5–8.1)

## 2024-07-30 LAB — BASIC METABOLIC PANEL WITH GFR
Anion gap: 11 (ref 5–15)
BUN: 10 mg/dL (ref 6–20)
CO2: 20 mmol/L — ABNORMAL LOW (ref 22–32)
Calcium: 9.3 mg/dL (ref 8.9–10.3)
Chloride: 104 mmol/L (ref 98–111)
Creatinine, Ser: 1.03 mg/dL — ABNORMAL HIGH (ref 0.44–1.00)
GFR, Estimated: >60 mL/min (ref >60)
Glucose, Bld: 93 mg/dL (ref 70–99)
Potassium: 3.5 mmol/L (ref 3.5–5.1)
Sodium: 136 mmol/L (ref 135–145)

## 2024-07-30 LAB — CBC
HCT: 33 % — ABNORMAL LOW (ref 36.0–46.0)
Hemoglobin: 10.2 g/dL — ABNORMAL LOW (ref 12.0–15.0)
MCH: 22.6 pg — ABNORMAL LOW (ref 26.0–34.0)
MCHC: 30.9 g/dL (ref 30.0–36.0)
MCV: 73 fL — ABNORMAL LOW (ref 80.0–100.0)
Platelets: 410 K/uL — ABNORMAL HIGH (ref 150–400)
RBC: 4.52 MIL/uL (ref 3.87–5.11)
RDW: 22.5 % — ABNORMAL HIGH (ref 11.5–15.5)
WBC: 7.4 K/uL (ref 4.0–10.5)
nRBC: 0 % (ref 0.0–0.2)

## 2024-07-30 LAB — TYPE AND SCREEN
ABO/RH(D): O POS
Antibody Screen: NEGATIVE
Unit division: 0
Unit division: 0

## 2024-07-30 LAB — MAGNESIUM: Magnesium: 2.2 mg/dL (ref 1.7–2.4)

## 2024-07-30 LAB — PHOSPHORUS: Phosphorus: 4.4 mg/dL (ref 2.5–4.6)

## 2024-07-30 MED ORDER — LACTATED RINGERS IV SOLN
INTRAVENOUS | Status: DC
Start: 2024-07-30 — End: 2024-07-31

## 2024-07-30 MED ORDER — SODIUM CHLORIDE 0.9 % IV SOLN: INTRAVENOUS | Status: DC

## 2024-07-30 NOTE — Progress Notes (Signed)
 PROGRESS NOTE    Katie Merritt  FMW:[id number] DOB: 01/11/1971 DOA: 07/28/2024 PCP: Dorina Loving, PA-C   Brief Narrative: 53 year old past medical history significant hypertension, hyperlipidemia, COPD presents complaining of back pain, she was found to have a hemoglobin of 6.6.  Patient received 2 units of packed red blood cells. Plan to proceed with endoscopy tomorrow   Assessment & Plan:   Principal Problem:   Gastrointestinal hemorrhage   1-Acute GI bleed Acute blood loss anemia -Patient presented with melena for a week.  Hemoglobin 6.6 on admission. - She received 2 units of packed red blood cell - Continue with PPI Hemoglobin is stable increased to 10. Endoscopy has been delayed for tomorrow.  Hypertension: Continue with amlodipine . Blood pressure has been elevated  Hyperlipidemia: Continue with Lipitor  Back pain resolved  Vitamin D deficiency: Started on supplements  Estimated body mass index is 33.79 kg/m as calculated from the following:   Height as of this encounter: 5' 5 (1.651 m).   Weight as of this encounter: 92.1 kg.   DVT prophylaxis: SCDs Code Status: Full code Family Communication: Disposition Plan:  Status is: Inpatient Remains inpatient appropriate because: Management of anemia    Consultants:  GI  Procedures:  \  Antimicrobials:    Subjective: Alert conversant denies any more melena  Objective: Vitals:   07/29/24 1631 07/29/24 1945 07/30/24 0533 07/30/24 1442  BP: (!) 157/109 (!) 171/120 (!) 144/87 (!) 160/107  Pulse: 82 78 75 86  Resp: 18 18 18 18   Temp: 98.1 F (36.7 C) 98.6 F (37 C) 98.2 F (36.8 C) 98.7 F (37.1 C)  TempSrc: Oral Oral Oral Oral  SpO2: 100% 100% 100% 100%  Weight:      Height:       No intake or output data in the 24 hours ending 07/30/24 1607 Filed Weights   07/28/24 2101  Weight: 92.1 kg    Examination:  General exam: Appears calm and comfortable  Respiratory system: Clear to  auscultation. Respiratory effort normal. Cardiovascular system: S1 & S2 heard, RRR. No JVD, murmurs, rubs, gallops or clicks. No pedal edema. Gastrointestinal system: Abdomen is nondistended, soft and nontender. No organomegaly or masses felt. Normal bowel sounds heard. Central nervous system: Alert and oriented. No focal neurological deficits. Extremities: Symmetric 5 x 5 power.   Data Reviewed: I have personally reviewed following labs and imaging studies  CBC: Recent Labs  Lab 07/28/24 1737 07/29/24 0524 07/29/24 1242 07/29/24 2117 07/30/24 0514  WBC 10.8* 9.2  --   --  7.4  NEUTROABS 8.7*  --   --   --   --   HGB 6.6* 10.0* 10.8* 10.3* 10.2*  HCT 24.3* 31.5* 34.1* 33.1* 33.0*  MCV 70.4* 73.8*  --   --  73.0*  PLT 484* 436*  --   --  410*   Basic Metabolic Panel: Recent Labs  Lab 07/28/24 1656 07/29/24 0524 07/30/24 0514  NA 136 139 136  K 3.8 3.6 3.5  CL 103 106 104  CO2 20* 22 20*  GLUCOSE 102* 97 93  BUN 17 15 10   CREATININE 1.26* 1.04* 1.03*  CALCIUM  8.8* 9.1 9.3  MG  --  2.2 2.2  PHOS  --  3.9 4.4   GFR: Estimated Creatinine Clearance: 71.6 mL/min (A) (by C-G formula based on SCr of 1.03 mg/dL (H)). Liver Function Tests: Recent Labs  Lab 07/28/24 1656 07/30/24 0514  AST 16 18  ALT 11 13  ALKPHOS 144*  137*  BILITOT 0.4 0.9  PROT 6.6 6.4*  ALBUMIN 4.1 4.0   No results for input(s): LIPASE, AMYLASE in the last 168 hours. No results for input(s): AMMONIA in the last 168 hours. Coagulation Profile: No results for input(s): INR, PROTIME in the last 168 hours. Cardiac Enzymes: Recent Labs  Lab 07/28/24 1656  CKTOTAL 66   BNP (last 3 results) No results for input(s): PROBNP in the last 8760 hours. HbA1C: No results for input(s): HGBA1C in the last 72 hours. CBG: No results for input(s): GLUCAP in the last 168 hours. Lipid Profile: No results for input(s): CHOL, HDL, LDLCALC, TRIG, CHOLHDL, LDLDIRECT in the last 72  hours. Thyroid  Function Tests: No results for input(s): TSH, T4TOTAL, FREET4, T3FREE, THYROIDAB in the last 72 hours. Anemia Panel: Recent Labs    07/29/24 1018  VITAMINB12 722  FOLATE 10.0  TIBC 575*  IRON 66   Sepsis Labs: No results for input(s): PROCALCITON, LATICACIDVEN in the last 168 hours.  No results found for this or any previous visit (from the past 240 hours).       Radiology Studies: No results found.      Scheduled Meds:  amLODipine   10 mg Oral Daily   atorvastatin   10 mg Oral Daily   pantoprazole (PROTONIX) IV  40 mg Intravenous Q12H   Vitamin D (Ergocalciferol)  50,000 Units Oral Q7 days   Continuous Infusions:  sodium chloride      lactated ringers 75 mL/hr at 07/30/24 1053     LOS: 2 days    Time spent: 35 minutes.     Owen DELENA Lore, MD [facility]   If 7PM-7AM, please contact night-coverage [email or web address]  07/30/2024, 4:07 PM

## 2024-07-30 NOTE — Progress Notes (Signed)
   07/30/24 1657  Spiritual Encounters  Type of Visit Initial  Care provided to: Patient;Pt and family  Referral source Nurse (RN/NT/LPN)  Reason for visit Advance directives   I responded to a spiritual care consult request by Clotilda Barrette, RN on patient's behalf.  Katie Merritt welcomed my visit. She stated that a family member had encouraged completion of the HCPOA. She welcomed a brief explanation of the elements of the form but expressed plan to read and discuss at length rather than rush to complete now.  I shared basic elements of form and steps for follow up. Katie Merritt knows a chaplain is available for this or other needs and can be reached through care team.  Katie Merritt L. Delores HERO.Div

## 2024-07-30 NOTE — Progress Notes (Addendum)
     [facility] Progress Note  CC:  Severe anemia   Subjective:  Feeling much better.  No further sign of bleeding or abdominal pain.  Objective:  Vital signs in last 24 hours: Temp:  [98 F (36.7 C)-98.6 F (37 C)] 98.2 F (36.8 C) (10/06 0533) Pulse Rate:  [73-82] 75 (10/06 0533) Resp:  [18] 18 (10/06 0533) BP: (144-171)/(87-120) 144/87 (10/06 0533) SpO2:  [100 %] 100 % (10/06 0533) Last BM Date : 07/28/24 (Per pt) General:  Alert, Well-developed, in NAD Heart:  Regular rate and rhythm; no murmurs Pulm:  CTAB.  No W/R/R. Abdomen:  Soft, non-distended.  BS present.  Non-tender. Neurologic:  Alert and oriented x 4;  grossly normal neurologically. Psych:  Alert and cooperative. Normal mood and affect.  Lab Results: Recent Labs    07/28/24 1737 07/29/24 0524 07/29/24 1242 07/29/24 2117 07/30/24 0514  WBC 10.8* 9.2  --   --  7.4  HGB 6.6* 10.0* 10.8* 10.3* 10.2*  HCT 24.3* 31.5* 34.1* 33.1* 33.0*  PLT 484* 436*  --   --  410*   BMET Recent Labs    07/28/24 1656 07/29/24 0524 07/30/24 0514  NA 136 139 136  K 3.8 3.6 3.5  CL 103 106 104  CO2 20* 22 20*  GLUCOSE 102* 97 93  BUN 17 15 10   CREATININE 1.26* 1.04* 1.03*  CALCIUM  8.8* 9.1 9.3   LFT Recent Labs    07/30/24 0514  PROT 6.4*  ALBUMIN 4.0  AST 18  ALT 13  ALKPHOS 137*  BILITOT 0.9  BILIDIR 0.3*  IBILI 0.6   Assessment / Plan: 1) Severe anemia with melenic stools for the last 3 week.  Hemoglobin 6.6 g upon admission.  Now improved to 10.2 g after 2 units of packed red blood cells.  FOBT positive.  Was taking meloxicam  regularly for the past month or so. 2) Chronic constipation. 3) History of colonic polyp. 4) Hypertension. 3) Hyperlipidemia.  - Monitor hemoglobin and transfuse further if needed. - Continue pantoprazole 40 mg IV twice daily. - EGD, but looks like that will be on 10/7.  I have put her on full liquids and will be NPO after midnight again on 10/7.   LOS: 2 days    Harlene BIRCH. Zehr  07/30/2024, 8:59 AM  GI ATTENDING  Interval history and data reviewed.  Patient seen and examined.  Case reviewed with Dr. Kristie.  Agree with comprehensive interval progress note as outlined above.  Transient upper GI bleed.  Stable.  On PPI.  For EGD tomorrow.The nature of the procedure, as well as the risks, benefits, and alternatives were carefully and thoroughly reviewed with the patient. Ample time for discussion and questions allowed. The patient understood, was satisfied, and agreed to proceed.  Norleen SAILOR. Abran Raddle., M.D. [facility] Division of Gastroenterology

## 2024-07-30 NOTE — Plan of Care (Signed)

## 2024-07-30 NOTE — Telephone Encounter (Signed)
 Spoke to pt says she is going to call HR and she will call us  back to make an apt

## 2024-07-30 NOTE — Plan of Care (Signed)

## 2024-07-30 NOTE — Progress Notes (Signed)
   07/30/24 1407  TOC Brief Assessment  Insurance and Status Reviewed  Patient has primary care physician Yes  Home environment has been reviewed Single family home  Prior level of function: Indpendent with ADL's  Prior/Current Home Services No current home services  Social Drivers of Health Review SDOH reviewed no interventions necessary  Readmission risk has been reviewed Yes  Transition of care needs no transition of care needs at this time

## 2024-07-31 ENCOUNTER — Inpatient Hospital Stay (HOSPITAL_COMMUNITY): Admitting: Anesthesiology

## 2024-07-31 ENCOUNTER — Encounter (HOSPITAL_COMMUNITY): Payer: Self-pay | Admitting: Internal Medicine

## 2024-07-31 ENCOUNTER — Encounter (HOSPITAL_COMMUNITY)
Admission: EM | Disposition: A | Discharge status: Home / Self Care | Payer: Self-pay | Source: Home / Self Care | Attending: Internal Medicine

## 2024-07-31 DIAGNOSIS — K449 Diaphragmatic hernia without obstruction or gangrene: Secondary | ICD-10-CM

## 2024-07-31 DIAGNOSIS — K2100 Gastro-esophageal reflux disease with esophagitis, without bleeding: Secondary | ICD-10-CM

## 2024-07-31 DIAGNOSIS — K922 Gastrointestinal hemorrhage, unspecified: Secondary | ICD-10-CM | POA: Diagnosis not present

## 2024-07-31 DIAGNOSIS — K250 Acute gastric ulcer with hemorrhage: Secondary | ICD-10-CM

## 2024-07-31 DIAGNOSIS — K259 Gastric ulcer, unspecified as acute or chronic, without hemorrhage or perforation: Secondary | ICD-10-CM

## 2024-07-31 DIAGNOSIS — K2950 Unspecified chronic gastritis without bleeding: Secondary | ICD-10-CM | POA: Diagnosis not present

## 2024-07-31 HISTORY — PX: ESOPHAGOGASTRODUODENOSCOPY: SHX5428

## 2024-07-31 LAB — BASIC METABOLIC PANEL WITH GFR
Anion gap: 11 (ref 5–15)
BUN: 8 mg/dL (ref 6–20)
CO2: 21 mmol/L — ABNORMAL LOW (ref 22–32)
Calcium: 9.1 mg/dL (ref 8.9–10.3)
Chloride: 105 mmol/L (ref 98–111)
Creatinine, Ser: 0.91 mg/dL (ref 0.44–1.00)
GFR, Estimated: >60 mL/min (ref >60)
Glucose, Bld: 92 mg/dL (ref 70–99)
Potassium: 3.3 mmol/L — ABNORMAL LOW (ref 3.5–5.1)
Sodium: 137 mmol/L (ref 135–145)

## 2024-07-31 LAB — MAGNESIUM: Magnesium: 2.1 mg/dL (ref 1.7–2.4)

## 2024-07-31 LAB — CBC
HCT: 31.2 % — ABNORMAL LOW (ref 36.0–46.0)
Hemoglobin: 9.7 g/dL — ABNORMAL LOW (ref 12.0–15.0)
MCH: 23 pg — ABNORMAL LOW (ref 26.0–34.0)
MCHC: 31.1 g/dL (ref 30.0–36.0)
MCV: 73.9 fL — ABNORMAL LOW (ref 80.0–100.0)
Platelets: 396 K/uL (ref 150–400)
RBC: 4.22 MIL/uL (ref 3.87–5.11)
RDW: 22.5 % — ABNORMAL HIGH (ref 11.5–15.5)
WBC: 8.7 K/uL (ref 4.0–10.5)
nRBC: 0 % (ref 0.0–0.2)

## 2024-07-31 LAB — HEPATIC FUNCTION PANEL
ALT: 9 U/L (ref 0–44)
AST: 14 U/L — ABNORMAL LOW (ref 15–41)
Albumin: 3.9 g/dL (ref 3.5–5.0)
Alkaline Phosphatase: 126 U/L (ref 38–126)
Bilirubin, Direct: 0.2 mg/dL (ref 0.0–0.2)
Indirect Bilirubin: 0.4 mg/dL (ref 0.3–0.9)
Total Bilirubin: 0.7 mg/dL (ref 0.0–1.2)
Total Protein: 6.1 g/dL — ABNORMAL LOW (ref 6.5–8.1)

## 2024-07-31 LAB — PHOSPHORUS: Phosphorus: 3.9 mg/dL (ref 2.5–4.6)

## 2024-07-31 LAB — OCCULT BLOOD, POC DEVICE: Fecal Occult Bld: POSITIVE — AB

## 2024-07-31 SURGERY — EGD (ESOPHAGOGASTRODUODENOSCOPY)
Anesthesia: Monitor Anesthesia Care

## 2024-07-31 MED ORDER — PHENYLEPHRINE 80 MCG/ML (10ML) SYRINGE FOR IV PUSH (FOR BLOOD PRESSURE SUPPORT)
PREFILLED_SYRINGE | INTRAVENOUS | Status: DC | PRN
  Administered 2024-07-31: 80 ug via INTRAVENOUS

## 2024-07-31 MED ORDER — PROPOFOL 500 MG/50ML IV EMUL
INTRAVENOUS | Status: DC | PRN
  Administered 2024-07-31: 50 mg via INTRAVENOUS
  Administered 2024-07-31: 150 ug/kg/min via INTRAVENOUS
  Administered 2024-07-31 (×2): 50 mg via INTRAVENOUS

## 2024-07-31 MED ORDER — LACTATED RINGERS IV SOLN: INTRAVENOUS | Status: DC | PRN

## 2024-07-31 MED ORDER — PROPOFOL 10 MG/ML IV BOLUS
INTRAVENOUS | Status: AC
  Filled 2024-07-31: qty 20

## 2024-07-31 MED ORDER — SODIUM CHLORIDE 0.9 % IV SOLN: INTRAVENOUS | Status: DC | PRN

## 2024-07-31 MED ORDER — PROPOFOL 1000 MG/100ML IV EMUL
INTRAVENOUS | Status: AC
  Filled 2024-07-31: qty 100

## 2024-07-31 MED ORDER — VITAMIN D (ERGOCALCIFEROL) 1.25 MG (50000 UNIT) PO CAPS: 50000.0000 [IU] | ORAL_CAPSULE | ORAL | 0 refills | Status: AC

## 2024-07-31 MED ORDER — PANTOPRAZOLE SODIUM 40 MG PO TBEC: 40.0000 mg | DELAYED_RELEASE_TABLET | Freq: Every day | ORAL | 11 refills | Status: DC

## 2024-07-31 NOTE — Plan of Care (Signed)
  Problem: Education: Goal: Knowledge of General Education information will improve Description: Including pain rating scale, medication(s)/side effects and non-pharmacologic comfort measures Outcome: Progressing   Problem: Health Behavior/Discharge Planning: Goal: Ability to manage health-related needs will improve Outcome: Progressing   Problem: Activity: Goal: Risk for activity intolerance will decrease Outcome: Progressing   Problem: Coping: Goal: Level of anxiety will decrease Outcome: Progressing   Problem: Pain Managment: Goal: General experience of comfort will improve and/or be controlled Outcome: Progressing   Problem: Safety: Goal: Ability to remain free from injury will improve Outcome: Progressing   Problem: Skin Integrity: Goal: Risk for impaired skin integrity will decrease Outcome: Progressing

## 2024-07-31 NOTE — Anesthesia Preprocedure Evaluation (Signed)
 Anesthesia Evaluation  Patient identified by MRN, date of birth, ID band Patient awake    Reviewed: Allergy & Precautions, NPO status , Patient's Chart, lab work & pertinent test results  History of Anesthesia Complications Negative for: history of anesthetic complications  Airway Mallampati: II  TM Distance: >3 FB Neck ROM: Full    Dental no notable dental hx. (+) Teeth Intact   Pulmonary neg sleep apnea, COPD, Patient abstained from smoking.Not current smoker, former smoker   Pulmonary exam normal breath sounds clear to auscultation       Cardiovascular Exercise Tolerance: Good METShypertension, Pt. on medications (-) CAD and (-) Past MI (-) dysrhythmias  Rhythm:Regular Rate:Normal - Systolic murmurs    Neuro/Psych  PSYCHIATRIC DISORDERS Anxiety     negative neurological ROS  negative psych ROS   GI/Hepatic ,neg GERD  ,,(+)     (-) substance abuse    Endo/Other  neg diabetes    Renal/GU negative Renal ROS     Musculoskeletal   Abdominal   Peds  Hematology  (+) Blood dyscrasia, anemia   Anesthesia Other Findings Past Medical History: No date: Anxiety No date: COPD (chronic obstructive pulmonary disease) (HCC) No date: Hyperlipidemia No date: Hypertension  Reproductive/Obstetrics                              Anesthesia Physical Anesthesia Plan  ASA: 2  Anesthesia Plan: MAC   Post-op Pain Management: Minimal or no pain anticipated   Induction: Intravenous  PONV Risk Score and Plan: 2 and Propofol infusion, TIVA and Ondansetron   Airway Management Planned: Nasal Cannula  Additional Equipment: None  Intra-op Plan:   Post-operative Plan:   Informed Consent: I have reviewed the patients History and Physical, chart, labs and discussed the procedure including the risks, benefits and alternatives for the proposed anesthesia with the patient or authorized representative who has  indicated his/her understanding and acceptance.     Dental advisory given  Plan Discussed with: CRNA and Surgeon  Anesthesia Plan Comments: (Discussed risks of anesthesia with patient, including possibility of difficulty with spontaneous ventilation under anesthesia necessitating airway intervention, PONV, and rare risks such as cardiac or respiratory or neurological events, and allergic reactions. Discussed the role of CRNA in patient's perioperative care. Patient understands.)         Anesthesia Quick Evaluation

## 2024-07-31 NOTE — Anesthesia Procedure Notes (Signed)
 Procedure Name: MAC Date/Time: 07/31/2024 1:08 PM  Performed by: Nada Corean CROME, CRNAPre-anesthesia Checklist: Patient identified, Emergency Drugs available, Suction available, Patient being monitored and Timeout performed Patient Re-evaluated:Patient Re-evaluated prior to induction Oxygen Delivery Method: Simple face mask Preoxygenation: Pre-oxygenation with 100% oxygen Induction Type: IV induction Placement Confirmation: positive ETCO2 and breath sounds checked- equal and bilateral Dental Injury: Teeth and Oropharynx as per pre-operative assessment  Comments: POM mask used.

## 2024-07-31 NOTE — Anesthesia Postprocedure Evaluation (Signed)
 Anesthesia Post Note  Patient: Katie Merritt  Procedure(s) Performed: EGD (ESOPHAGOGASTRODUODENOSCOPY)     Patient location during evaluation: Endoscopy Anesthesia Type: MAC Level of consciousness: awake and alert Pain management: pain level controlled Vital Signs Assessment: post-procedure vital signs reviewed and stable Respiratory status: spontaneous breathing, nonlabored ventilation, respiratory function stable and patient connected to nasal cannula oxygen Cardiovascular status: blood pressure returned to baseline and stable Postop Assessment: no apparent nausea or vomiting Anesthetic complications: no   No notable events documented.  Last Vitals:  Vitals:   07/31/24 1350 07/31/24 1355  BP: (!) 144/81   Pulse: 81 79  Resp: 14 15  Temp:    SpO2: 100% 100%    Last Pain:  Vitals:   07/31/24 1350  TempSrc:   PainSc: 0-No pain                 Rome Ade

## 2024-07-31 NOTE — Discharge Summary (Signed)
 Physician Discharge Summary   Patient: Katie Merritt MRN: [id number] DOB: 1971/06/08  Admit date:     07/28/2024  Discharge date: 07/31/24  Discharge Physician: Owen DELENA Lore   PCP: Dorina Loving, PA-C   Recommendations at discharge:    Needs repeat Vitamin D level.  Follow up with GI for Biopsy results.  Needs CBC to follow hb.   Discharge Diagnoses: Principal Problem:   Gastrointestinal hemorrhage Active Problems:   Acute gastric ulcer with hemorrhage  Resolved Problems:   * No resolved hospital problems. *  Hospital Course: 53 year old past medical history significant hypertension, hyperlipidemia, COPD presents complaining of back pain, she was found to have a hemoglobin of 6.6.  Patient received 2 units of packed red blood cells. Underwent endoscopy which showed non bleeding gastric ulcer.   Assessment and Plan: 1-Acute GI bleed; secondary to gastric Ulcer.  Acute blood loss anemia -Patient presented with melena for a week.  Hemoglobin 6.6 on admission. - She received 2 units of packed red blood cell - Continue with PPI Hemoglobin is stable 9 range.  Endoscopy showed gastric ulcer, non bleeding. Large hiatal hernia.   Hypertension: Continue with amlodipine . Blood pressure has been elevated   Hyperlipidemia: Continue with Lipitor   Back pain resolved   Vitamin D deficiency: Started on supplements   Estimated body mass index is 33.79 kg/m as calculated from the following:   Height as of this encounter: 5' 5 (1.651 m).   Weight as of this encounter: 92.1 kg.         Consultants: GI Procedures performed: None Disposition: Home Diet recommendation:  Discharge Diet Orders (From admission, onward)     Start     Ordered   07/31/24 0000  Diet - low sodium heart healthy        07/31/24 1510           Cardiac diet DISCHARGE MEDICATION: Allergies as of 07/31/2024   No Known Allergies      Medication List     STOP taking these medications     omeprazole  40 MG capsule Commonly known as: PRILOSEC       TAKE these medications    amLODipine  10 MG tablet Commonly known as: NORVASC  Take 1 tablet (10 mg total) by mouth daily.   atorvastatin  10 MG tablet Commonly known as: LIPITOR Take 1 tablet (10 mg total) by mouth daily.   levonorgestrel  20 MCG/24HR IUD Commonly known as: MIRENA  Mirena  20 mcg/24 hours (5 yrs) 52 mg intrauterine device  Take 1 device by intrauterine route.   pantoprazole 40 MG tablet Commonly known as: Protonix Take 1 tablet (40 mg total) by mouth daily.   SUMAtriptan  50 MG tablet Commonly known as: Imitrex  Take 1 tablet (50 mg total) by mouth every 2 (two) hours as needed for migraine. May repeat in 2 hours if headache persists or recurs.   topiramate  25 MG tablet Commonly known as: TOPAMAX  1 tab po  twice daily   Vitamin D (Ergocalciferol) 1.25 MG (50000 UNIT) Caps capsule Commonly known as: DRISDOL Take 1 capsule (50,000 Units total) by mouth every 7 (seven) days. Start taking on: August 06, 2024        Discharge Exam: Fredricka Weights   07/28/24 2101 07/31/24 1221  Weight: 92.1 kg 92.1 kg   General; NAD  Condition at discharge: stable  The results of significant diagnostics from this hospitalization (including imaging, microbiology, ancillary and laboratory) are listed below for reference.   Imaging Studies:  No results found.  Microbiology: Results for orders placed or performed in visit on 09/23/17  Urine Culture     Status: Abnormal   Collection Time: 09/23/17  9:08 AM   Specimen: Urine  Result Value Ref Range Status   MICRO NUMBER: [id number]  Final   SPECIMEN QUALITY: ADEQUATE  Final   Sample Source NOT GIVEN  Final   STATUS: FINAL  Final   ISOLATE 1: Citrobacter koseri (A)  Final   ISOLATE 2: Streptococcus, viridans group (A)  Final      Susceptibility   Citrobacter koseri - URINE CULTURE, REFLEX    AMOX/CLAVULANIC 4 Sensitive     CEFAZOLIN* <=4 Not Reportable       * For infections other than uncomplicated UTIcaused by E. coli, K. pneumoniae or P. mirabilis:Cefazolin is resistant if MIC > or = 8 mcg/mL.(Distinguishing susceptible versus intermediatefor isolates with MIC < or = 4 mcg/mL requiresadditional testing.)For uncomplicated UTI caused by E. coli,K. pneumoniae or P. mirabilis: Cefazolin issusceptible if MIC <32 mcg/mL and predictssusceptible to the oral agents cefaclor, cefdinir,cefpodoxime, cefprozil, cefuroxime, cephalexinand loracarbef.    CEFEPIME <=1 Sensitive     CEFTRIAXONE <=1 Sensitive     CIPROFLOXACIN  <=0.25 Sensitive     LEVOFLOXACIN <=0.12 Sensitive     ERTAPENEM <=0.5 Sensitive     GENTAMICIN <=1 Sensitive     IMIPENEM <=0.25 Sensitive     NITROFURANTOIN <=16 Sensitive     PIP/TAZO <=4 Sensitive     TOBRAMYCIN <=1 Sensitive     TRIMETH/SULFA* <=20 Sensitive      * For infections other than uncomplicated UTIcaused by E. coli, K. pneumoniae or P. mirabilis:Cefazolin is resistant if MIC > or = 8 mcg/mL.(Distinguishing susceptible versus intermediatefor isolates with MIC < or = 4 mcg/mL requiresadditional testing.)For uncomplicated UTI caused by E. coli,K. pneumoniae or P. mirabilis: Cefazolin issusceptible if MIC <32 mcg/mL and predictssusceptible to the oral agents cefaclor, cefdinir,cefpodoxime, cefprozil, cefuroxime, cephalexinand loracarbef.Legend:S = Susceptible  I = IntermediateR = Resistant  NS = Not susceptible* = Not tested  NR = Not reported**NN = See antimicrobic comments    Labs: CBC: Recent Labs  Lab 07/28/24 1737 07/29/24 0524 07/29/24 1242 07/29/24 2117 07/30/24 0514 07/31/24 0551  WBC 10.8* 9.2  --   --  7.4 8.7  NEUTROABS 8.7*  --   --   --   --   --   HGB 6.6* 10.0* 10.8* 10.3* 10.2* 9.7*  HCT 24.3* 31.5* 34.1* 33.1* 33.0* 31.2*  MCV 70.4* 73.8*  --   --  73.0* 73.9*  PLT 484* 436*  --   --  410* 396   Basic Metabolic Panel: Recent Labs  Lab 07/28/24 1656 07/29/24 0524 07/30/24 0514 07/31/24 0551  NA  136 139 136 137  K 3.8 3.6 3.5 3.3*  CL 103 106 104 105  CO2 20* 22 20* 21*  GLUCOSE 102* 97 93 92  BUN 17 15 10 8   CREATININE 1.26* 1.04* 1.03* 0.91  CALCIUM  8.8* 9.1 9.3 9.1  MG  --  2.2 2.2 2.1  PHOS  --  3.9 4.4 3.9   Liver Function Tests: Recent Labs  Lab 07/28/24 1656 07/30/24 0514 07/31/24 0551  AST 16 18 14*  ALT 11 13 9   ALKPHOS 144* 137* 126  BILITOT 0.4 0.9 0.7  PROT 6.6 6.4* 6.1*  ALBUMIN 4.1 4.0 3.9   CBG: No results for input(s): GLUCAP in the last 168 hours.  Discharge time spent: greater than 30 minutes.  Signed:  Owen DELENA Lore, MD [facility] 07/31/2024

## 2024-07-31 NOTE — Progress Notes (Addendum)
 Patient for EGD later today.  She feels good.  Hgb is 9.7 grams.  No questions or concerns.  GI ATTENDING As above. Norleen SAILOR. Abran Raddle., M.D. [facility] Division of Gastroenterology

## 2024-07-31 NOTE — Op Note (Signed)
 [facility] Patient Name: Katie Merritt Procedure Date: 07/31/2024 MRN: [id number] Attending MD: Norleen SAILOR. Abran , MD, [id number] Date of Birth: 01/04/1971 CSN: [id number] Age: 53 Admit Type: Inpatient Procedure:                Upper GI endoscopy with biopsies Indications:              Acute post hemorrhagic anemia, Melena Providers:                Norleen SAILOR. Abran, MD, Hoy Penner, RN, Coye Bade, Technician Referring MD:             [facility] Medicines:                Monitored Anesthesia Care Complications:            No immediate complications. Estimated Blood Loss:     Estimated blood loss: none. Procedure:                Pre-Anesthesia Assessment:                           - Prior to the procedure, a History and Physical                            was performed, and patient medications and                            allergies were reviewed. The patient's tolerance of                            previous anesthesia was also reviewed. The risks                            and benefits of the procedure and the sedation                            options and risks were discussed with the patient.                            All questions were answered, and informed consent                            was obtained. Prior Anticoagulants: The patient has                            taken no anticoagulant or antiplatelet agents. ASA                            Grade Assessment: II - A patient with mild systemic                            disease. After reviewing the risks and benefits,  the patient was deemed in satisfactory condition to                            undergo the procedure.                           After obtaining informed consent, the endoscope was                            passed under direct vision. Throughout the                            procedure, the patient's blood pressure, pulse, and                             oxygen saturations were monitored continuously. The                            [id number]-H190 ([id number]) Olympus endoscope was introduced                            through the mouth, and advanced to the second part                            of duodenum. The upper GI endoscopy was                            accomplished without difficulty. The patient                            tolerated the procedure well. Scope In: Scope Out: Findings:      The esophagus revealed mild distal esophagitis.      The stomach revealed a large hiatal hernia.      One non-bleeding linear gastric ulcer with no stigmata of bleeding was       found in the gastric antrum. The lesion was 8 mm in largest dimension.       Biopsies were taken with a cold forceps for histology, from the gastric       antrum, to rule out H. pylori.      The examined duodenum was normal.      The cardia and gastric fundus were normal on retroflexion. Impression:               1. Nonbleeding gastric ulcer as cause for recent                            melena and drop in hemoglobin. No active bleeding                            or stigmata. Status post biopsies to rule out H.                            pylori  2. Reflux esophagitis                           3. Large hiatal hernia                           4. Otherwise unremarkable EGD. Moderate Sedation:      none Recommendation:           1. Resume diet                           2. Pantoprazole 40 mg daily indefinitely. This will                            provide prophylaxis against recurrent ulcers as                            well as treat reflux esophagitis.                           3. Avoid NSAIDs                           4. Return to the care of primary service.                           Okay for discharge from GI perspective. We will                            follow-up on biopsies. No outpatient GI follow-up                             needed.                           Discussed with patient. She was provided a copy of                            this report. GI will sign off. Procedure Code(s):        --- Professional ---                           [phone], Esophagogastroduodenoscopy, flexible,                            transoral; with biopsy, single or multiple Diagnosis Code(s):        --- Professional ---                           K25.9, Gastric ulcer, unspecified as acute or                            chronic, without hemorrhage or perforation                           D62, Acute posthemorrhagic anemia  K92.1, Melena (includes Hematochezia) CPT copyright 2022 American [employer] Association. All rights reserved. The codes documented in this report are preliminary and upon coder review may  be revised to meet current compliance requirements. Norleen SAILOR. Abran, MD 07/31/2024 1:30:46 PM This report has been signed electronically. Number of Addenda: 0

## 2024-07-31 NOTE — Plan of Care (Signed)

## 2024-07-31 NOTE — Transfer of Care (Signed)
 Immediate Anesthesia Transfer of Care Note  Patient: Katie Merritt  Procedure(s) Performed: EGD (ESOPHAGOGASTRODUODENOSCOPY)  Patient Location: PACU and Endoscopy Unit  Anesthesia Type:MAC  Level of Consciousness: awake, alert , and oriented  Airway & Oxygen Therapy: Patient Spontanous Breathing and Patient connected to face mask oxygen  Post-op Assessment: Report given to RN and Post -op Vital signs reviewed and stable  Post vital signs: Reviewed and stable  Last Vitals:  Vitals Value Taken Time  BP 128/86 07/31/24 13:26  Temp    Pulse    Resp 16 07/31/24 13:26  SpO2 100 % 07/31/24 13:26    Last Pain:  Vitals:   07/31/24 1326  TempSrc:   PainSc: 0-No pain      Patients Stated Pain Goal: 0 (07/31/24 1326)  Complications: No notable events documented.

## 2024-08-01 ENCOUNTER — Telehealth: Payer: Self-pay

## 2024-08-01 NOTE — Transitions of Care (Post Inpatient/ED Visit) (Signed)
   08/01/2024  Name: Katie Merritt MRN: [id number] DOB: 1971-01-16  Today's TOC FU Call Status: Today's TOC FU Call Status:: Successful TOC FU Call Completed TOC FU Call Complete Date: 08/01/24 Patient's Name and Date of Birth confirmed.  Transition Care Management Follow-up Telephone Call Date of Discharge: 07/31/24 Discharge Facility: [facility]) Type of Discharge: Inpatient Admission Primary Inpatient Discharge Diagnosis:: GI bleed How have you been since you were released from the hospital?: Better Any questions or concerns?: No  Items Reviewed: Did you receive and understand the discharge instructions provided?: Yes Medications obtained,verified, and reconciled?: Yes (Medications Reviewed) Any new allergies since your discharge?: No Dietary orders reviewed?: Yes Do you have support at home?: Yes People in Home [RPT]: friend(s)  Medications Reviewed Today: Medications Reviewed Today     Reviewed by Emmitt Pan, LPN (Licensed Practical Nurse) on 08/01/24 at 1052  Med List Status: <None>   Medication Order Taking? Sig Documenting Provider Last Dose Status Informant  amLODipine  (NORVASC ) 10 MG tablet [id number] Yes Take 1 tablet (10 mg total) by mouth daily. Saguier, [place], PA-C  Active Self, Pharmacy Records  atorvastatin  (LIPITOR) 10 MG tablet [id number] Yes Take 1 tablet (10 mg total) by mouth daily. Saguier, [place], PA-C  Active Self, Pharmacy Records  levonorgestrel  (MIRENA ) 20 MCG/24HR IUD [id number] Yes Mirena  20 mcg/24 hours (5 yrs) 52 mg intrauterine device  Take 1 device by intrauterine route. [provider]  Active Self, Pharmacy Records  pantoprazole (PROTONIX) 40 MG tablet [id number] Yes Take 1 tablet (40 mg total) by mouth daily. Regalado, Belkys A, MD  Active   SUMAtriptan  (IMITREX ) 50 MG tablet [id number] Yes Take 1 tablet (50 mg total) by mouth every 2 (two) hours as needed for migraine. May repeat in 2 hours if headache persists or recurs.  Saguier, Dallas, PA-C  Active Self, Pharmacy Records  topiramate  (TOPAMAX ) 25 MG tablet [id number] Yes 1 tab po  twice daily Saguier, Dallas, PA-C  Active Self, Pharmacy Records  Vitamin D, Ergocalciferol, (DRISDOL) 1.25 MG (50000 UNIT) CAPS capsule [id number] Yes Take 1 capsule (50,000 Units total) by mouth every 7 (seven) days. Regalado, Owen LABOR, MD  Active             Home Care and Equipment/Supplies: Were Home Health Services Ordered?: NA Any new equipment or medical supplies ordered?: NA  Functional Questionnaire: Do you need assistance with bathing/showering or dressing?: No Do you need assistance with meal preparation?: No Do you need assistance with eating?: No Do you have difficulty maintaining continence: No Do you need assistance with getting out of bed/getting out of a chair/moving?: No Do you have difficulty managing or taking your medications?: No  Follow up appointments reviewed: PCP Follow-up appointment confirmed?: Yes Date of PCP follow-up appointment?: 08/02/24 Follow-up Provider: [facility] Follow-up appointment confirmed?: No Reason Specialist Follow-Up Not Confirmed: Patient has Specialist Provider Number and will Call for Appointment Do you need transportation to your follow-up appointment?: No Do you understand care options if your condition(s) worsen?: Yes-patient verbalized understanding    SIGNATURE Pan Emmitt, LPN [facility] Nurse Health Advisor Direct Dial [phone]

## 2024-08-02 ENCOUNTER — Ambulatory Visit: Payer: Self-pay | Admitting: Medical

## 2024-08-02 ENCOUNTER — Telehealth: Payer: Self-pay

## 2024-08-02 ENCOUNTER — Ambulatory Visit: Payer: Self-pay | Admitting: Internal Medicine

## 2024-08-02 ENCOUNTER — Ambulatory Visit: Admitting: Medical

## 2024-08-02 ENCOUNTER — Encounter (HOSPITAL_COMMUNITY): Payer: Self-pay | Admitting: Internal Medicine

## 2024-08-02 VITALS — BP 122/80 | HR 75 | Temp 98.0°F | Resp 15 | Ht 65.0 in | Wt 203.8 lb

## 2024-08-02 DIAGNOSIS — K250 Acute gastric ulcer with hemorrhage: Secondary | ICD-10-CM | POA: Diagnosis not present

## 2024-08-02 DIAGNOSIS — I10 Essential (primary) hypertension: Secondary | ICD-10-CM | POA: Diagnosis not present

## 2024-08-02 DIAGNOSIS — K219 Gastro-esophageal reflux disease without esophagitis: Secondary | ICD-10-CM

## 2024-08-02 DIAGNOSIS — R252 Cramp and spasm: Secondary | ICD-10-CM

## 2024-08-02 DIAGNOSIS — D649 Anemia, unspecified: Secondary | ICD-10-CM | POA: Diagnosis not present

## 2024-08-02 DIAGNOSIS — E876 Hypokalemia: Secondary | ICD-10-CM

## 2024-08-02 LAB — CBC WITH DIFFERENTIAL/PLATELET
Basophils Absolute: 0.1 K/uL (ref 0.0–0.1)
Basophils Relative: 1.1 % (ref 0.0–3.0)
Eosinophils Absolute: 0.1 K/uL (ref 0.0–0.7)
Eosinophils Relative: 1.6 % (ref 0.0–5.0)
HCT: 34.6 % — ABNORMAL LOW (ref 36.0–46.0)
Hemoglobin: 11 g/dL — ABNORMAL LOW (ref 12.0–15.0)
Lymphocytes Relative: 15.9 % (ref 12.0–46.0)
Lymphs Abs: 1.2 K/uL (ref 0.7–4.0)
MCHC: 31.7 g/dL (ref 30.0–36.0)
MCV: 72.5 fl — ABNORMAL LOW (ref 78.0–100.0)
Monocytes Absolute: 0.6 K/uL (ref 0.1–1.0)
Monocytes Relative: 7.5 % (ref 3.0–12.0)
Neutro Abs: 5.7 K/uL (ref 1.4–7.7)
Neutrophils Relative %: 73.9 % (ref 43.0–77.0)
Platelets: 343 K/uL (ref 150.0–400.0)
RBC: 4.78 Mil/uL (ref 3.87–5.11)
RDW: 26.3 % — ABNORMAL HIGH (ref 11.5–15.5)
WBC: 7.7 K/uL (ref 4.0–10.5)

## 2024-08-02 LAB — SURGICAL PATHOLOGY

## 2024-08-02 NOTE — Patient Instructions (Signed)
 Acute gastric ulcer with hemorrhage Recent hospitalization for acute gastric ulcer with hemorrhage. When endoscopy done  showed non-bleeding gastric ulcer with mild chronic gastritis. No H. pylori identified. - Continue pantoprazole. - Refer to GI for follow-up appointment. - Advise on healthy diet.  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 Initial hemoglobin was 6.6 g/dL, received two units of packed red blood cells during hospitalization. - Order repeat CBC to monitor hemoglobin levels.  Gastroesophageal reflux disease and hiatal hernia GERD and hiatal hernia, previously on omeprazole , now switched to pantoprazole post-hospitalization. - Continue pantoprazole. - Advise on healthy diet.  Leg and lower body cramps Intermittent leg and lower body cramps resumed post-hospitalization. Potassium was mildly low at discharge but normal during admission. - Order repeat metabolic panel to check potassium levels. - Advise on potential dietary potassium supplementation based on lab results.  Hypokalemia Mild hypokalemia noted at discharge. Potential contributor to leg cramps. - Order repeat metabolic panel to assess current potassium levels. - Advise on dietary potassium intake based on lab results.  Vitamin D deficiency Known vitamin D deficiency, previously untreated. Discharged with vitamin D 50,000 units weekly for five weeks. Plan to reassess in six weeks. - Order repeat vitamin D level in six weeks to assess need for continued high-dose supplementation or transition to over-the-counter options.  Hypertension Blood pressure well-controlled at 122/80 mmHg. - Continue amlodipine  10 mg daily.  Filled out moderate complex fmla for today during exam  Follow up in 3 weeks or sooner if needed

## 2024-08-02 NOTE — Telephone Encounter (Signed)
 Short term disability forms faxed with confirmation received

## 2024-08-02 NOTE — Progress Notes (Signed)
 Subjective:    Patient ID: Katie Merritt, female    DOB: 07/06/1971, 53 y.o.   MRN: [id number]  HPI   Pt in for follow up on recent hospitalization. See below in .   Admit date:     07/28/2024  Discharge date: 07/31/24  Discharge Physician: Owen DELENA Lore    PCP: Dorina Loving, PA-C    Recommendations at discharge:     Needs repeat Vitamin D level.  Follow up with GI for Biopsy results.  Needs CBC to follow hb.    Discharge Diagnoses: Principal Problem:   Gastrointestinal hemorrhage Active Problems:   Acute gastric ulcer with hemorrhage   Hospital Course: 53 year old past medical history significant hypertension, hyperlipidemia, COPD presents complaining of back pain, she was found to have a hemoglobin of 6.6.  Patient received 2 units of packed red blood cells. Underwent endoscopy which showed non bleeding gastric ulcer.    Assessment and Plan: 1-Acute GI bleed; secondary to gastric Ulcer.  Acute blood loss anemia -Patient presented with melena for a week.  Hemoglobin 6.6 on admission. - She received 2 units of packed red blood cell - Continue with PPI Hemoglobin is stable 9 range.  Endoscopy showed gastric ulcer, non bleeding. Large hiatal hernia.    Hypertension: Continue with amlodipine . Blood pressure has been elevated   Hyperlipidemia: Continue with Lipitor   Back pain resolved   Vitamin D deficiency: Started on supplements   Estimated body mass index is 33.79 kg/m as calculated from the following:   Height as of this encounter: 5' 5 (1.651 m).   Weight as of this encounter: 92.1 kg.             Consultants: GI Procedures performed: None Disposition: Home Diet recommendation:    TAKE these medications     amLODipine  10 MG tablet Commonly known as: NORVASC  Take 1 tablet (10 mg total) by mouth daily.    atorvastatin  10 MG tablet Commonly known as: LIPITOR Take 1 tablet (10 mg total) by mouth daily.    levonorgestrel  20  MCG/24HR IUD Commonly known as: MIRENA  Mirena  20 mcg/24 hours (5 yrs) 52 mg intrauterine device  Take 1 device by intrauterine route.    pantoprazole 40 MG tablet Commonly known as: Protonix Take 1 tablet (40 mg total) by mouth daily.    SUMAtriptan  50 MG tablet Commonly known as: Imitrex  Take 1 tablet (50 mg total) by mouth every 2 (two) hours as needed for migraine. May repeat in 2 hours if headache persists or recurs.    topiramate  25 MG tablet Commonly known as: TOPAMAX  1 tab po  twice daily    Vitamin D (Ergocalciferol) 1.25 MG (50000 UNIT) Caps capsule Commonly known as: DRISDOL Take 1 capsule (50,000 Units total) by mouth every 7 (seven) days. Start taking on: August 06, 2024      Katie Merritt Katie Merritt is a 53 year old female who presents for follow-up after hospitalization for gastrointestinal hemorrhage.  She was recently hospitalized for a gastrointestinal hemorrhage due to an active gastric ulcer. On the day of the emergency department visit, she felt faint and dizzy, which she attributes to anemia. She experienced sweating and severe cramping from the waist down, including her stomach, back, and legs, which did not subside with rest. This prompted her daughter to call EMS, as she was unable to sit in a car due to the pain.  During her hospitalization, her hemoglobin was noted to be 6.6 g/dL, and  she received two units of packed red blood cells. An endoscopy revealed a non-bleeding gastric ulcer, which had previously bled. She was discharged on pantoprazole and instructed to discontinue omeprazole , which she had been taking for GERD and a hiatal hernia. She reports improvement in fatigue and dizziness since discharge.  She has a history of vitamin D deficiency, for which she was not receiving treatment prior to hospitalization. During her hospital stay, she was noted to have a low potassium level on discharge, although it was normal during admission. She was not given  potassium supplements upon discharge. Her leg cramps, which had subsided during hospitalization, have recently recurred, starting from the back of her legs to her buttocks, described as a 'squeezing' sensation.  She is currently taking amlodipine  10 mg for hypertension, which was not discontinued during her hospital stay. Her blood pressure was elevated during hospitalization. Most recently, her blood pressure was 122/80 mmHg.   She was also prescribed vitamin D 50,000 units weekly upon discharge.  No longer experiencing black stools. No current dizziness or lightheadedness during activities.    Review of Systems  Constitutional:  Negative for chills, fatigue and fever.  HENT:  Negative for congestion.   Respiratory:  Negative for cough, chest tightness and wheezing.   Cardiovascular:  Negative for chest pain and palpitations.  Gastrointestinal:  Negative for abdominal distention, anal bleeding, blood in stool, diarrhea and vomiting.  Musculoskeletal:  Negative for back pain.  Skin:  Negative for rash.  Neurological:  Negative for dizziness.  Hematological:  Negative for adenopathy.  Psychiatric/Behavioral:  Negative for behavioral problems and dysphoric mood.     Past Medical History:  Diagnosis Date   Anxiety    COPD (chronic obstructive pulmonary disease) (HCC)    Hyperlipidemia    Hypertension      Social History   Socioeconomic History   Marital status: Single    Spouse name: Not on file   Number of children: Not on file   Years of education: Not on file   Highest education level: Associate degree: occupational, Scientist, product/process development, or vocational program  Occupational History   Not on file  Tobacco Use   Smoking status: Former    Current packs/day: 0.00    Types: Cigarettes    Quit date: 11/25/2013    Years since quitting: 10.6   Smokeless tobacco: Never  Vaping Use   Vaping status: Never Used  Substance and Sexual Activity   Alcohol use: Yes    Alcohol/week: 0.0  standard drinks of alcohol    Comment: occ   Drug use: Not Currently   Sexual activity: Not on file  Other Topics Concern   Not on file  Social History Narrative   Not on file   Social Drivers of Health   Financial Resource Strain: Medium Risk (05/11/2024)   Overall Financial Resource Strain (CARDIA)    Difficulty of Paying Living Expenses: Somewhat hard  Food Insecurity: No Food Insecurity (07/28/2024)   Hunger Vital Sign    Worried About Running Out of Food in the Last Year: Never true    Ran Out of Food in the Last Year: Never true  Transportation Needs: No Transportation Needs (07/28/2024)   PRAPARE - Administrator, Civil Service (Medical): No    Lack of Transportation (Non-Medical): No  Physical Activity: Insufficiently Active (05/11/2024)   Exercise Vital Sign    Days of Exercise per Week: 1 day    Minutes of Exercise per Session: 30 min  Stress:  No Stress Concern Present (05/11/2024)   [employer] of Occupational Health - Occupational Stress Questionnaire    Feeling of Stress: Only a little  Social Connections: Moderately Isolated (05/11/2024)   Social Connection and Isolation Panel    Frequency of Communication with Friends and Family: More than three times a week    Frequency of Social Gatherings with Friends and Family: Twice a week    Attends Religious Services: More than 4 times per year    Active Member of [employer] or Organizations: No    Attends Banker Meetings: Not on file    Marital Status: Never married  Intimate Partner Violence: Not At Risk (07/28/2024)   Humiliation, Afraid, Rape, and Kick questionnaire    Fear of Current or Ex-Partner: No    Emotionally Abused: No    Physically Abused: No    Sexually Abused: No    Past Surgical History:  Procedure Laterality Date   ESOPHAGOGASTRODUODENOSCOPY N/A 07/31/2024   Procedure: EGD (ESOPHAGOGASTRODUODENOSCOPY);  Surgeon: Abran Norleen SAILOR, MD;  Location: [facility];  Service:  Gastroenterology;  Laterality: N/A;   Leap     TUBAL LIGATION      Family History  Problem Relation Age of Onset   Hyperlipidemia Mother    Hypertension Mother    Colon cancer Neg Hx    Colon polyps Neg Hx    Esophageal cancer Neg Hx    Rectal cancer Neg Hx    Stomach cancer Neg Hx     No Known Allergies  Current Outpatient Medications on File Prior to Visit  Medication Sig Dispense Refill   amLODipine  (NORVASC ) 10 MG tablet Take 1 tablet (10 mg total) by mouth daily. 90 tablet 3   atorvastatin  (LIPITOR) 10 MG tablet Take 1 tablet (10 mg total) by mouth daily. 90 tablet 3   levonorgestrel  (MIRENA ) 20 MCG/24HR IUD Mirena  20 mcg/24 hours (5 yrs) 52 mg intrauterine device  Take 1 device by intrauterine route.     pantoprazole (PROTONIX) 40 MG tablet Take 1 tablet (40 mg total) by mouth daily. 30 tablet 11   SUMAtriptan  (IMITREX ) 50 MG tablet Take 1 tablet (50 mg total) by mouth every 2 (two) hours as needed for migraine. May repeat in 2 hours if headache persists or recurs. 10 tablet 2   topiramate  (TOPAMAX ) 25 MG tablet 1 tab po  twice daily 60 tablet 3   [START ON 08/06/2024] Vitamin D, Ergocalciferol, (DRISDOL) 1.25 MG (50000 UNIT) CAPS capsule Take 1 capsule (50,000 Units total) by mouth every 7 (seven) days. 5 capsule 0   No current facility-administered medications on file prior to visit.    BP 122/80   Pulse 75   Temp 98 F (36.7 C) (Oral)   Resp 15   Ht 5' 5 (1.651 m)   Wt 203 lb 12.8 oz (92.4 kg)   SpO2 99%   BMI 33.91 kg/m        Objective:   Physical Exam  General Mental Status- Alert. General Appearance- Not in acute distress.   Skin General: Color- Normal Color. Moisture- Normal Moisture.  Neck  No JVD.  Chest and Lung Exam Auscultation: Breath Sounds: CTA  Cardiovascular Auscultation:Rythm- RRR Murmurs & Other Heart Sounds:Auscultation of the heart reveals- No Murmurs.  Abdomen Inspection:-Inspeection Normal. Palpation/Percussion:Note:No  mass. Palpation and Percussion of the abdomen reveal- Non Tender, Non Distended + BS, no rebound or guarding.   Neurologic Cranial Nerve exam:- CN III-XII intact(No nystagmus), symmetric smile. Strength:- 5/5 equal and symmetric  strength both upper and lower extremities.       Assessment & Plan:   Patient Instructions  Acute gastric ulcer with hemorrhage Recent hospitalization for acute gastric ulcer with hemorrhage. When endoscopy done  showed non-bleeding gastric ulcer with mild chronic gastritis. No H. pylori identified. - Continue pantoprazole. - Refer to GI for follow-up appointment. - Advise on healthy diet.  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 Initial hemoglobin was 6.6 g/dL, received two units of packed red blood cells during hospitalization. - Order repeat CBC to monitor hemoglobin levels.  Gastroesophageal reflux disease and hiatal hernia GERD and hiatal hernia, previously on omeprazole , now switched to pantoprazole post-hospitalization. - Continue pantoprazole. - Advise on healthy diet.  Leg and lower body cramps Intermittent leg and lower body cramps resumed post-hospitalization. Potassium was mildly low at discharge but normal during admission. - Order repeat metabolic panel to check potassium levels. - Advise on potential dietary potassium supplementation based on lab results.  Hypokalemia Mild hypokalemia noted at discharge. Potential contributor to leg cramps. - Order repeat metabolic panel to assess current potassium levels. - Advise on dietary potassium intake based on lab results.  Vitamin D deficiency Known vitamin D deficiency, previously untreated. Discharged with vitamin D 50,000 units weekly for five weeks. Plan to reassess in six weeks. - Order repeat vitamin D level in six weeks to assess need for continued high-dose supplementation or transition to  over-the-counter options.  Hypertension Blood pressure well-controlled at 122/80 mmHg. - Continue amlodipine  10 mg daily.  Filled out moderate complex fmla for today during exam  Follow up in 3 weeks or sooner if needed   [employer], PA-C    I personally spent a total of 42 minutes in the care of the patient today including getting/reviewing separately obtained history, performing a medically appropriate exam/evaluation, placing orders, referring and communicating with other health care professionals, documenting clinical information in the EHR, and filling our moderate complex fmla for in order for pt to return to work.[name]

## 2024-08-03 LAB — COMPLETE METABOLIC PANEL WITHOUT GFR
AG Ratio: 1.8 (calc) (ref 1.0–2.5)
ALT: 10 U/L (ref 6–29)
AST: 12 U/L (ref 10–35)
Albumin: 4.4 g/dL (ref 3.6–5.1)
Alkaline phosphatase (APISO): 129 U/L (ref 37–153)
BUN/Creatinine Ratio: 14 (calc) (ref 6–22)
BUN: 15 mg/dL (ref 7–25)
CO2: 26 mmol/L (ref 20–32)
Calcium: 9 mg/dL (ref 8.6–10.4)
Chloride: 103 mmol/L (ref 98–110)
Creat: 1.05 mg/dL — ABNORMAL HIGH (ref 0.50–1.03)
Globulin: 2.5 g/dL (ref 1.9–3.7)
Glucose, Bld: 107 mg/dL — ABNORMAL HIGH (ref 65–99)
Potassium: 3.4 mmol/L — ABNORMAL LOW (ref 3.5–5.3)
Sodium: 138 mmol/L (ref 135–146)
Total Bilirubin: 0.4 mg/dL (ref 0.2–1.2)
Total Protein: 6.9 g/dL (ref 6.1–8.1)

## 2024-08-31 ENCOUNTER — Ambulatory Visit: Admitting: Medical

## 2024-10-26 ENCOUNTER — Encounter: Admitting: Medical

## 2024-10-29 ENCOUNTER — Encounter: Payer: Self-pay | Admitting: Medical

## 2024-10-30 ENCOUNTER — Encounter: Payer: Self-pay | Admitting: Medical

## 2024-10-30 ENCOUNTER — Ambulatory Visit: Admitting: Medical

## 2024-10-30 ENCOUNTER — Other Ambulatory Visit: Payer: Self-pay | Admitting: Medical

## 2024-10-30 VITALS — BP 160/98 | Resp 15 | Ht 65.0 in | Wt 198.2 lb

## 2024-10-30 DIAGNOSIS — I10 Essential (primary) hypertension: Secondary | ICD-10-CM

## 2024-10-30 DIAGNOSIS — D649 Anemia, unspecified: Secondary | ICD-10-CM | POA: Diagnosis not present

## 2024-10-30 DIAGNOSIS — G43809 Other migraine, not intractable, without status migrainosus: Secondary | ICD-10-CM

## 2024-10-30 DIAGNOSIS — Z1231 Encounter for screening mammogram for malignant neoplasm of breast: Secondary | ICD-10-CM

## 2024-10-30 DIAGNOSIS — Z1159 Encounter for screening for other viral diseases: Secondary | ICD-10-CM

## 2024-10-30 DIAGNOSIS — Z0000 Encounter for general adult medical examination without abnormal findings: Secondary | ICD-10-CM | POA: Diagnosis not present

## 2024-10-30 DIAGNOSIS — Z23 Encounter for immunization: Secondary | ICD-10-CM

## 2024-10-30 MED ORDER — SUMATRIPTAN SUCCINATE 50 MG PO TABS: 50.0000 mg | ORAL_TABLET | ORAL | 2 refills | Status: AC | PRN

## 2024-10-30 MED ORDER — VALSARTAN 160 MG PO TABS: 160.0000 mg | ORAL_TABLET | Freq: Every day | ORAL | 0 refills | Status: DC

## 2024-10-30 NOTE — Patient Instructions (Addendum)
 For you wellness exam today I have ordered cbc, hep c antibody, cmp and  lipid panel  Vaccine given today. Shingrix  vaccine today.  Recommend exercise and healthy diet.  We will let you know lab results as they come in.  Follow up date appointment will be determined after lab review.    Essential hypertension Blood pressure consistently elevated despite maximum dose of amlodipine . Previous intolerance to diuretics due to frequent urination.  - Added valsartan  160 mg to current regimen. - Scheduled nurse blood pressure check for Friday morning. - Continue amlodipine  10 mg. - Advised to seek emergency care if experiencing headache, vision changes, numbness, or weakness etc.  Migraine Migraines occur approximately three times per month, responsive to sumatriptan . Not currently using Topamax  for prevention. - Refilled sumatriptan  prescription at [facility]. - Continue to monitor frequency of migraines and consider Topamax  if frequency increases. -fmla form to be filled out.   Anemia History of GI bleed and anemia. No current symptoms of GI bleeding. Missed previous gastroenterologist referral. - Contact gastroenterologist's office to schedule follow-up appointment. - Ordered blood work including iron panel to assess for anemia. -cbc and iron panel today.   Preventive Care 72-52 Years Old, Female Preventive care refers to lifestyle choices and visits with your health care provider that can promote health and wellness. Preventive care visits are also called wellness exams. What can I expect for my preventive care visit? Counseling Your health care provider may ask you questions about your: Medical history, including: Past medical problems. Family medical history. Pregnancy history. Current health, including: Menstrual cycle. Method of birth control. Emotional well-being. Home life and relationship well-being. Sexual activity and sexual health. Lifestyle, including: Alcohol,  nicotine or tobacco, and drug use. Access to firearms. Diet, exercise, and sleep habits. Work and work astronomer. Sunscreen use. Safety issues such as seatbelt and bike helmet use. Physical exam Your health care provider will check your: Height and weight. These may be used to calculate your BMI (body mass index). BMI is a measurement that tells if you are at a healthy weight. Waist circumference. This measures the distance around your waistline. This measurement also tells if you are at a healthy weight and may help predict your risk of certain diseases, such as type 2 diabetes and high blood pressure. Heart rate and blood pressure. Body temperature. Skin for abnormal spots. What immunizations do I need?  Vaccines are usually given at various ages, according to a schedule. Your health care provider will recommend vaccines for you based on your age, medical history, and lifestyle or other factors, such as travel or where you work. What tests do I need? Screening Your health care provider may recommend screening tests for certain conditions. This may include: Lipid and cholesterol levels. Diabetes screening. This is done by checking your blood sugar (glucose) after you have not eaten for a while (fasting). Pelvic exam and Pap test. Hepatitis B test. Hepatitis C test. HIV (human immunodeficiency virus) test. STI (sexually transmitted infection) testing, if you are at risk. Lung cancer screening. Colorectal cancer screening. Mammogram. Talk with your health care provider about when you should start having regular mammograms. This may depend on whether you have a family history of breast cancer. BRCA-related cancer screening. This may be done if you have a family history of breast, ovarian, tubal, or peritoneal cancers. Bone density scan. This is done to screen for osteoporosis. Talk with your health care provider about your test results, treatment options, and if necessary, the need for  more tests. Follow these instructions at home: Eating and drinking  Eat a diet that includes fresh fruits and vegetables, whole grains, lean protein, and low-fat dairy products. Take vitamin and mineral supplements as recommended by your health care provider. Do not drink alcohol if: Your health care provider tells you not to drink. You are pregnant, may be pregnant, or are planning to become pregnant. If you drink alcohol: Limit how much you have to 0-1 drink a day. Know how much alcohol is in your drink. In the [place] one drink equals one 12 oz bottle of beer (355 mL), one 5 oz glass of wine (148 mL), or one 1 oz glass of hard liquor (44 mL). Lifestyle Brush your teeth every morning and night with fluoride toothpaste. Floss one time each day. Exercise for at least 30 minutes 5 or more days each week. Do not use any products that contain nicotine or tobacco. These products include cigarettes, chewing tobacco, and vaping devices, such as e-cigarettes. If you need help quitting, ask your health care provider. Do not use drugs. If you are sexually active, practice safe sex. Use a condom or other form of protection to prevent STIs. If you do not wish to become pregnant, use a form of birth control. If you plan to become pregnant, see your health care provider for a prepregnancy visit. Take aspirin only as told by your health care provider. Make sure that you understand how much to take and what form to take. Work with your health care provider to find out whether it is safe and beneficial for you to take aspirin daily. Find healthy ways to manage stress, such as: Meditation, yoga, or listening to music. Journaling. Talking to a trusted person. Spending time with friends and family. Minimize exposure to UV radiation to reduce your risk of skin cancer. Safety Always wear your seat belt while driving or riding in a vehicle. Do not drive: If you have been drinking alcohol. Do not ride with  someone who has been drinking. When you are tired or distracted. While texting. If you have been using any mind-altering substances or drugs. Wear a helmet and other protective equipment during sports activities. If you have firearms in your house, make sure you follow all gun safety procedures. Seek help if you have been physically or sexually abused. What's next? Visit your health care provider once a year for an annual wellness visit. Ask your health care provider how often you should have your eyes and teeth checked. Stay up to date on all vaccines. This information is not intended to replace advice given to you by your health care provider. Make sure you discuss any questions you have with your health care provider. Document Revised: 04/08/2021 Document Reviewed: 04/08/2021 Elsevier Patient Education  2024 [employer].

## 2024-10-30 NOTE — Progress Notes (Signed)
 "  Subjective:    Patient ID: Katie Merritt, female    DOB: 07-14-1971, 54 y.o.   MRN: [id number]  HPI  Pt in for wellness exam. She is fasting   Working for [employer] at home. Pt states mild exercise twice a week at home. Row and ab crunches. Pt states moderate healthy but admits some junk food(tried to avoid eating out). Nonsmoker. Alcohol-  one bottle of wine on weekends   Up to date on colonoscopy.   Pt is due for mammogram. Placed order today.   Papsmear- up to date.   Pt declines flu vaccine.   Katie Merritt is a 54 year old female with hypertension who presents with concerns about elevated blood pressure readings.  She notes persistently elevated home blood pressures despite amlodipine  10 mg daily, with recent readings up to 178/102 mmHg. Her blood pressure was borderline until July and has been elevated since October. She was hospitalized on October 4 around the time of an EGD for gi bleed/ulcer and has not yet followed up with gastroenterology. Though they have contacted her. She has no black or bloody stools. No abdomen pain.  She has no cardiac or neurologic signs or sympotms.  She uses Wegovy  for weight loss through an insurance program and tracks her weight and blood pressure as part of this. She states at home bp level have been high as well though she did not contact us  on those high readings  She has a prior gastrointestinal bleed but denies current melena, hematochezia, or other GI bleeding symptoms since discharge. She reports dry mouth and craving ice, which she associates with prior anemia.  She has migraines about three times per month that respond to sumatriptan . She is not taking Topamax  regularly for prevention. She currently denies  headache presently. Also no  dizziness, vision changes, numbness, or weakness.  She needs fmla form filled out again for time off when gets migraine ha.  Review of Systems  Constitutional:  Negative for chills, fatigue  and fever.  HENT:  Negative for congestion and drooling.   Eyes:  Negative for pain and visual disturbance.  Respiratory:  Negative for chest tightness, shortness of breath and wheezing.   Cardiovascular:  Negative for chest pain and palpitations.  Gastrointestinal:  Negative for abdominal pain, blood in stool, diarrhea and rectal pain.  Genitourinary:  Negative for dysuria.  Musculoskeletal:  Negative for back pain and myalgias.  Skin:  Negative for rash.  Neurological:  Negative for dizziness, weakness and headaches.       No ha presently.  Hematological:  Negative for adenopathy. Does not bruise/bleed easily.  Psychiatric/Behavioral:  Negative for behavioral problems, decreased concentration and dysphoric mood.     Past Medical History:  Diagnosis Date   Anxiety    COPD (chronic obstructive pulmonary disease) (HCC)    Hyperlipidemia    Hypertension      Social History   Socioeconomic History   Marital status: Single    Spouse name: Not on file   Number of children: Not on file   Years of education: Not on file   Highest education level: GED or equivalent  Occupational History   Not on file  Tobacco Use   Smoking status: Former    Current packs/day: 0.00    Types: Cigarettes    Quit date: 11/25/2013    Years since quitting: 10.9   Smokeless tobacco: Never  Vaping Use   Vaping status: Never Used  Substance and Sexual  Activity   Alcohol use: Yes    Alcohol/week: 0.0 standard drinks of alcohol    Comment: occ   Drug use: Not Currently   Sexual activity: Not on file  Other Topics Concern   Not on file  Social History Narrative   Not on file   Social Drivers of Health   Tobacco Use: Medium Risk (10/30/2024)   Patient History    Smoking Tobacco Use: Former    Smokeless Tobacco Use: Never    Passive Exposure: Not on file  Financial Resource Strain: Medium Risk (10/29/2024)   Overall Financial Resource Strain (CARDIA)    Difficulty of Paying Living Expenses: Somewhat  hard  Food Insecurity: Food Insecurity Present (10/29/2024)   Epic    Worried About Programme Researcher, Broadcasting/film/video in the Last Year: Never true    Ran Out of Food in the Last Year: Sometimes true  Transportation Needs: No Transportation Needs (10/29/2024)   Epic    Lack of Transportation (Medical): No    Lack of Transportation (Non-Medical): No  Physical Activity: Insufficiently Active (05/11/2024)   Exercise Vital Sign    Days of Exercise per Week: 1 day    Minutes of Exercise per Session: 30 min  Stress: No Stress Concern Present (10/29/2024)   [employer] of Occupational Health - Occupational Stress Questionnaire    Feeling of Stress: Only a little  Social Connections: Moderately Isolated (10/29/2024)   Social Connection and Isolation Panel    Frequency of Communication with Friends and Family: More than three times a week    Frequency of Social Gatherings with Friends and Family: Once a week    Attends Religious Services: More than 4 times per year    Active Member of Clubs or Organizations: No    Attends Engineer, Structural: Not on file    Marital Status: Never married  Intimate Partner Violence: Not At Risk (07/28/2024)   Epic    Fear of Current or Ex-Partner: No    Emotionally Abused: No    Physically Abused: No    Sexually Abused: No  Depression (PHQ2-9): Medium Risk (10/30/2024)   Depression (PHQ2-9)    PHQ-2 Score: 5  Alcohol Screen: Low Risk (05/11/2024)   Alcohol Screen    Last Alcohol Screening Score (AUDIT): 2  Housing: High Risk (10/29/2024)   Epic    Unable to Pay for Housing in the Last Year: Yes    Number of Times Moved in the Last Year: 0    Homeless in the Last Year: No  Utilities: Not At Risk (07/28/2024)   Epic    Threatened with loss of utilities: No  Health Literacy: Not on file    Past Surgical History:  Procedure Laterality Date   ESOPHAGOGASTRODUODENOSCOPY N/A 07/31/2024   Procedure: EGD (ESOPHAGOGASTRODUODENOSCOPY);  Surgeon: Abran Norleen SAILOR, MD;   Location: [facility];  Service: Gastroenterology;  Laterality: N/A;   Leap     TUBAL LIGATION      Family History  Problem Relation Age of Onset   Hyperlipidemia Mother    Hypertension Mother    Colon cancer Neg Hx    Colon polyps Neg Hx    Esophageal cancer Neg Hx    Rectal cancer Neg Hx    Stomach cancer Neg Hx     Allergies[1]  Medications Ordered Prior to Encounter[2]  BP (!) 160/98 Comment: 2nd reading by myself today ESPAC.  Resp 15   Ht 5' 5 (1.651 m)   Wt 198 lb 3.2  oz (89.9 kg)   BMI 32.98 kg/m        Objective:   Physical Exam   General Mental Status- Alert. General Appearance- Not in acute distress.   Skin General: Color- Normal Color. Moisture- Normal Moisture.  Neck Carotid Arteries- Normal color. Moisture- Normal Moisture. No carotid bruits. No JVD.  Chest and Lung Exam Auscultation: Breath Sounds:-Normal.  Cardiovascular Auscultation:Rythm- Regular. Murmurs & Other Heart Sounds:Auscultation of the heart reveals- No Murmurs.  Abdomen Inspection:-Inspeection Normal. Palpation/Percussion:Note:No mass. Palpation and Percussion of the abdomen reveal- Non Tender, Non Distended + BS, no rebound or guarding.    Neurologic Cranial Nerve exam:- CN III-XII intact(No nystagmus), symmetric smile. Drift Test:- No drift. Finger to Nose:- Normal/Intact Strength:- 5/5 equal and symmetric strength both upper and lower extremities.      Assessment & Plan:   Patient Instructions  For you wellness exam today I have ordered cbc, cmp and  lipid panel  Vaccine given today. Shingrix  vaccine today.  Recommend exercise and healthy diet.  We will let you know lab results as they come in.  Follow up date appointment will be determined after lab review.    Essential hypertension Blood pressure consistently elevated despite maximum dose of amlodipine . Previous intolerance to diuretics due to frequent urination.  - Added valsartan  160 mg to current  regimen. - Scheduled nurse blood pressure check for Friday morning. - Continue amlodipine  10 mg. - Advised to seek emergency care if experiencing headache, vision changes, numbness, or weakness etc.  Migraine Migraines occur approximately three times per month, responsive to sumatriptan . Not currently using Topamax  for prevention. - Refilled sumatriptan  prescription at [facility]. - Continue to monitor frequency of migraines and consider Topamax  if frequency increases. -fmla form to be filled out.   Anemia History of GI bleed and anemia. No current symptoms of GI bleeding. Missed previous gastroenterologist referral. - Contact gastroenterologist's office to schedule follow-up appointment. - Ordered blood work including iron panel to assess for anemia. -cbc and iron panel today.   00785 charge as did address htn, migraine, anemia and will fill out fmla form    [1] No Known Allergies [2]  Current Outpatient Medications on File Prior to Visit  Medication Sig Dispense Refill   amLODipine  (NORVASC ) 10 MG tablet Take 1 tablet (10 mg total) by mouth daily. 90 tablet 3   atorvastatin  (LIPITOR) 10 MG tablet Take 1 tablet (10 mg total) by mouth daily. 90 tablet 3   levonorgestrel  (MIRENA ) 20 MCG/24HR IUD Mirena  20 mcg/24 hours (5 yrs) 52 mg intrauterine device  Take 1 device by intrauterine route.     pantoprazole  (PROTONIX ) 40 MG tablet Take 1 tablet (40 mg total) by mouth daily. 30 tablet 11   semaglutide -weight management (WEGOVY ) 0.5 MG/0.5ML SOAJ SQ injection Inject 0.5 mg into the skin.     topiramate  (TOPAMAX ) 25 MG tablet 1 tab po  twice daily 60 tablet 3   Vitamin D , Ergocalciferol , (DRISDOL ) 1.25 MG (50000 UNIT) CAPS capsule Take 1 capsule (50,000 Units total) by mouth every 7 (seven) days. 5 capsule 0   No current facility-administered medications on file prior to visit.   "

## 2024-10-31 ENCOUNTER — Ambulatory Visit: Payer: Self-pay | Admitting: Medical

## 2024-10-31 ENCOUNTER — Encounter: Payer: Self-pay | Admitting: Medical

## 2024-10-31 LAB — COMPREHENSIVE METABOLIC PANEL WITH GFR
ALT: 11 U/L (ref 3–35)
AST: 13 U/L (ref 5–37)
Albumin: 4.7 g/dL (ref 3.5–5.2)
Alkaline Phosphatase: 145 U/L — ABNORMAL HIGH (ref 39–117)
BUN: 11 mg/dL (ref 6–23)
CO2: 26 meq/L (ref 19–32)
Calcium: 9.6 mg/dL (ref 8.4–10.5)
Chloride: 100 meq/L (ref 96–112)
Creatinine, Ser: 1.04 mg/dL (ref 0.40–1.20)
GFR: 61.76 mL/min
Glucose, Bld: 87 mg/dL (ref 70–99)
Potassium: 3.7 meq/L (ref 3.5–5.1)
Sodium: 136 meq/L (ref 135–145)
Total Bilirubin: 0.6 mg/dL (ref 0.2–1.2)
Total Protein: 7.7 g/dL (ref 6.0–8.3)

## 2024-10-31 LAB — CBC WITH DIFFERENTIAL/PLATELET
Basophils Absolute: 0.1 K/uL (ref 0.0–0.1)
Basophils Relative: 0.9 % (ref 0.0–3.0)
Eosinophils Absolute: 0.1 K/uL (ref 0.0–0.7)
Eosinophils Relative: 1.9 % (ref 0.0–5.0)
HCT: 31.7 % — ABNORMAL LOW (ref 36.0–46.0)
Hemoglobin: 9.7 g/dL — ABNORMAL LOW (ref 12.0–15.0)
Lymphocytes Relative: 24.1 % (ref 12.0–46.0)
Lymphs Abs: 1.6 K/uL (ref 0.7–4.0)
MCHC: 30.5 g/dL (ref 30.0–36.0)
MCV: 65 fl — ABNORMAL LOW (ref 78.0–100.0)
Monocytes Absolute: 0.5 K/uL (ref 0.1–1.0)
Monocytes Relative: 7.1 % (ref 3.0–12.0)
Neutro Abs: 4.4 K/uL (ref 1.4–7.7)
Neutrophils Relative %: 66 % (ref 43.0–77.0)
Platelets: 368 K/uL (ref 150.0–400.0)
RBC: 4.88 Mil/uL (ref 3.87–5.11)
RDW: 21.7 % — ABNORMAL HIGH (ref 11.5–15.5)
WBC: 6.7 K/uL (ref 4.0–10.5)

## 2024-10-31 LAB — LIPID PANEL
Cholesterol: 219 mg/dL — ABNORMAL HIGH (ref 28–200)
HDL: 54.3 mg/dL
LDL Cholesterol: 138 mg/dL — ABNORMAL HIGH (ref 10–99)
NonHDL: 165.13
Total CHOL/HDL Ratio: 4
Triglycerides: 135 mg/dL (ref 10.0–149.0)
VLDL: 27 mg/dL (ref 0.0–40.0)

## 2024-10-31 LAB — IRON,TIBC AND FERRITIN PANEL
%SAT: 4 % — ABNORMAL LOW (ref 16–45)
Ferritin: 6 ng/mL — ABNORMAL LOW (ref 16–232)
Iron: 22 ug/dL — ABNORMAL LOW (ref 45–160)
TIBC: 590 ug/dL — ABNORMAL HIGH (ref 250–450)

## 2024-10-31 LAB — HEPATITIS C ANTIBODY: Hepatitis C Ab: NONREACTIVE

## 2024-10-31 NOTE — Addendum Note (Signed)
 Addended by: DORINA DALLAS DORINA PA-C M on: 10/31/2024 11:55 AM   Modules accepted: Orders

## 2024-11-02 ENCOUNTER — Encounter: Payer: Self-pay | Admitting: Medical

## 2024-11-02 ENCOUNTER — Ambulatory Visit

## 2024-11-02 ENCOUNTER — Other Ambulatory Visit

## 2024-11-04 ENCOUNTER — Telehealth: Payer: Self-pay | Admitting: Medical

## 2024-11-04 DIAGNOSIS — Z0279 Encounter for issue of other medical certificate: Secondary | ICD-10-CM

## 2024-11-04 NOTE — Telephone Encounter (Signed)
 Filled out pt fmla form.

## 2024-11-05 ENCOUNTER — Telehealth: Payer: Self-pay

## 2024-11-05 NOTE — Telephone Encounter (Signed)
 FMLA forms faxed with confirmation received

## 2024-11-07 ENCOUNTER — Encounter: Payer: Self-pay | Admitting: Medical

## 2024-11-20 NOTE — Telephone Encounter (Signed)
 Papers re faxed again with #3 confirmed

## 2024-11-20 NOTE — Telephone Encounter (Unsigned)
 Copied from CRM #[id number]. Topic: General - Other >> Nov 20, 2024  9:49 AM Roselie BROCKS wrote: Reason for CRM: Ileana from [employer] is calling for clarification on FMLA paperwork for the patient, on question 7 it states 2 times per month,  for approximately 3. Kayla needs clarification on if the 3 means minutes,days,hours or what. Please return call once available.

## 2024-11-23 ENCOUNTER — Encounter: Payer: Self-pay | Admitting: Medical

## 2024-11-23 ENCOUNTER — Ambulatory Visit (INDEPENDENT_AMBULATORY_CARE_PROVIDER_SITE_OTHER): Admitting: Medical

## 2024-11-23 ENCOUNTER — Ambulatory Visit: Admitting: Sports Medicine

## 2024-11-23 VITALS — BP 120/78 | HR 96 | Temp 98.3°F | Ht 65.0 in | Wt 201.6 lb

## 2024-11-23 VITALS — BP 128/72 | HR 112 | Ht 65.0 in | Wt 203.0 lb

## 2024-11-23 DIAGNOSIS — G8929 Other chronic pain: Secondary | ICD-10-CM

## 2024-11-23 DIAGNOSIS — M5442 Lumbago with sciatica, left side: Secondary | ICD-10-CM

## 2024-11-23 MED ORDER — MELOXICAM 15 MG PO TABS: ORAL_TABLET | ORAL | 0 refills | Status: AC

## 2024-11-23 NOTE — Patient Instructions (Addendum)
 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 Mild degenerative changes in the lumbar spine with sacralization of L5. Symptoms include tingling in the left leg and spasms, with no significant weakness. Previous treatment with prednisone taper and muscle relaxants provided temporary relief. Last management includes meloxicam  and a home exercise program. She is considering ADA accommodations for work-related ergonomic adjustments. But presently appears most likely benefit from short term disability. -defer treatment to sport med as he is seeing you today. - Follow up with sports medicine for further evaluation and short term disability followed by work modifications - Consider ADA accommodations for ergonomic adjustments at work, such as a standing desk or supportive belt. - Monitor for potential referral to physical therapy by sports medicine. -I gave short term disability paperwork copy today so sport med can fill out. -I kept copy of form in event I need to fill out in future.  Follow up date to be determined after sport med note review. Also update me on plan regarding paperwork   Dr Leonce did send me a update note on paperwork below in . Knowing this I won't be filling out paperwork.     - patient brought paperwork for short term disability.  I discussed with her that I do not have diagnostic criteria that would support out of work for short-term disability for 2 months.  I do support altering ergonomics to decrease low back strain with ergonomic chair, sit/stand desk.  Work note provided to allow for [employer] computer, ergonomic chair.

## 2024-11-23 NOTE — Patient Instructions (Addendum)
-   Start meloxicam  15 mg daily x2 weeks.  If still having pain after 2 weeks, complete 3rd-week of NSAID. May use remaining NSAID as needed once daily for pain control.  Do not to use additional over-the-counter NSAIDs (ibuprofen, naproxen, Advil, Aleve, etc.) while taking prescription NSAIDs.  May use Tylenol  [phone] mg 2 to 3 times a day for breakthrough pain.  Meloxicam  refill   Low back HEP   Work note provided recommend a sit stand desk  MRI lumbar   Call and follow up 1 week after MRI to discuss results

## 2024-11-28 NOTE — Progress Notes (Unsigned)
 "    11/28/2024 Katie Merritt [id number] 03-20-1971  Primary Gastroenterologist: Dr. San    Chief Complaint:  History of Present Illness: Katie Merritt is a 54 year old female with a past medical history of anxiety, hypertension, hyperlipidemia, COPD, vitamin D  deficiency, GI bleed 07/2024, chronic constipation and colon polyps.   Previously admitted 10/04 - 07/31/2024. Admission Hg 6.6. Transfused 2 units of PRBCs. EGD 07/31/2024 showed a large hiatal hernia, reflux esophagitis and a nonbleeding gastric ulcer. She was instructed to take Pantoprazole  indefinitely.    Discussed the use of AI scribe software for clinical note transcription with the patient, who gave verbal consent to proceed.  History of Present Illness    EGD 07/31/2024 as an inpatient due UGI bleed/melena: 1. Nonbleeding gastric ulcer as cause for recent melena and drop in hemoglobin. No active bleeding or stigmata. Status post biopsies to rule out H. pylori  2. Reflux esophagitis  3. Large hiatal hernia  4. Otherwise unremarkable EGD.   Colonoscopy 06/27/2020 by Dr. San: - One 3 mm polyp in the sigmoid colon, removed with a cold snare. Resected and retrieved.  - The distal rectum and anal verge are normal on retroflexion view.  - 5 year recall colonoscopy  - TUBULAR ADENOMA. - NO HIGH GRADE DYSPLASIA OR MALIGNANCY.     Latest Ref Rng & Units 10/30/2024    2:16 PM 08/02/2024    9:22 AM 07/31/2024    5:51 AM  CBC  WBC 4.0 - 10.5 K/uL 6.7  7.7  8.7   Hemoglobin 12.0 - 15.0 g/dL 9.7  88.9  9.7   Hematocrit 36.0 - 46.0 % 31.7  34.6  31.2   Platelets 150.0 - 400.0 K/uL 368.0  343.0  396        Latest Ref Rng & Units 10/30/2024    2:16 PM 08/02/2024    9:22 AM 07/31/2024    5:51 AM  CMP  Glucose 70 - 99 mg/dL 87  892  92   BUN 6 - 23 mg/dL 11  15  8    Creatinine 0.40 - 1.20 mg/dL 8.95  8.94  9.08   Sodium 135 - 145 mEq/L 136  138  137   Potassium 3.5 - 5.1 mEq/L 3.7  3.4  3.3   Chloride 96 -  112 mEq/L 100  103  105   CO2 19 - 32 mEq/L 26  26  21    Calcium  8.4 - 10.5 mg/dL 9.6  9.0  9.1   Total Protein 6.0 - 8.3 g/dL 7.7  6.9  6.1   Total Bilirubin 0.2 - 1.2 mg/dL 0.6  0.4  0.7   Alkaline Phos 39 - 117 U/L 145   126   AST 5 - 37 U/L 13  12  14    ALT 3 - 35 U/L 11  10  9       Current Medications, Allergies, Past Medical History, Past Surgical History, Family History and Social History were reviewed in [employer] record.   Review of Systems:   Constitutional: Negative for fever, sweats, chills or weight loss.  Respiratory: Negative for shortness of breath.   Cardiovascular: Negative for chest pain, palpitations and leg swelling.  Gastrointestinal: See HPI.  Musculoskeletal: Negative for back pain or muscle aches.  Neurological: Negative for dizziness, headaches or paresthesias.    Physical Exam: There were no vitals taken for this visit. General: in no acute distress. Head: Normocephalic and atraumatic. Eyes: No scleral icterus. Conjunctiva pink .  Ears: Normal auditory acuity. Mouth: Dentition intact. No ulcers or lesions.  Lungs: Clear throughout to auscultation. Heart: Regular rate and rhythm, no murmur. Abdomen: Soft, nontender and nondistended. No masses or hepatomegaly. Normal bowel sounds x 4 quadrants.  Rectal: *** Musculoskeletal: Symmetrical with no gross deformities. Extremities: No edema. Neurological: Alert oriented x 4. No focal deficits.  Psychological: Alert and cooperative. Normal mood and affect  Assessment and Recommendations: ***    "

## 2024-11-29 ENCOUNTER — Encounter: Payer: Self-pay | Admitting: Nurse Practitioner

## 2024-11-29 ENCOUNTER — Telehealth: Payer: Self-pay | Admitting: *Deleted

## 2024-11-29 ENCOUNTER — Ambulatory Visit: Payer: Self-pay | Admitting: Nurse Practitioner

## 2024-11-29 ENCOUNTER — Other Ambulatory Visit

## 2024-11-29 ENCOUNTER — Ambulatory Visit: Admitting: Nurse Practitioner

## 2024-11-29 VITALS — BP 124/80 | HR 91 | Ht 65.0 in | Wt 201.1 lb

## 2024-11-29 DIAGNOSIS — D649 Anemia, unspecified: Secondary | ICD-10-CM

## 2024-11-29 DIAGNOSIS — K259 Gastric ulcer, unspecified as acute or chronic, without hemorrhage or perforation: Secondary | ICD-10-CM

## 2024-11-29 DIAGNOSIS — Z860101 Personal history of adenomatous and serrated colon polyps: Secondary | ICD-10-CM

## 2024-11-29 DIAGNOSIS — K219 Gastro-esophageal reflux disease without esophagitis: Secondary | ICD-10-CM

## 2024-11-29 DIAGNOSIS — K449 Diaphragmatic hernia without obstruction or gangrene: Secondary | ICD-10-CM

## 2024-11-29 LAB — COMPREHENSIVE METABOLIC PANEL WITH GFR
ALT: 9 U/L (ref 3–35)
AST: 11 U/L (ref 5–37)
Albumin: 4.4 g/dL (ref 3.5–5.2)
Alkaline Phosphatase: 123 U/L — ABNORMAL HIGH (ref 39–117)
BUN: 12 mg/dL (ref 6–23)
CO2: 28 meq/L (ref 19–32)
Calcium: 9.3 mg/dL (ref 8.4–10.5)
Chloride: 105 meq/L (ref 96–112)
Creatinine, Ser: 1.1 mg/dL (ref 0.40–1.20)
GFR: 57.7 mL/min — ABNORMAL LOW
Glucose, Bld: 98 mg/dL (ref 70–99)
Potassium: 4.1 meq/L (ref 3.5–5.1)
Sodium: 138 meq/L (ref 135–145)
Total Bilirubin: 0.4 mg/dL (ref 0.2–1.2)
Total Protein: 7.5 g/dL (ref 6.0–8.3)

## 2024-11-29 LAB — CBC
HCT: 26.1 % — ABNORMAL LOW (ref 36.0–46.0)
Hemoglobin: 8 g/dL — CL (ref 12.0–15.0)
MCHC: 30.8 g/dL (ref 30.0–36.0)
MCV: 63 fl — ABNORMAL LOW (ref 78.0–100.0)
Platelets: 260 10*3/uL (ref 150.0–400.0)
RBC: 4.15 Mil/uL (ref 3.87–5.11)
RDW: 19.6 % — ABNORMAL HIGH (ref 11.5–15.5)
WBC: 4.6 10*3/uL (ref 4.0–10.5)

## 2024-11-29 MED ORDER — PANTOPRAZOLE SODIUM 40 MG PO TBEC: 40.0000 mg | DELAYED_RELEASE_TABLET | Freq: Two times a day (BID) | ORAL | 2 refills | Status: AC

## 2024-11-29 MED ORDER — FERROUS SULFATE 325 (65 FE) MG PO TABS: 325.0000 mg | ORAL_TABLET | Freq: Every day | ORAL | 2 refills | Status: AC

## 2024-11-29 NOTE — Progress Notes (Signed)
 Agree with the assessment and plan as outlined by Elida Shawl, NP.   Initially had improving hemoglobin after hospital discharge, but most recent labs show downtrending hemoglobin and suspected symptomatic anemia.  Agree with repeating labs today and if hemoglobin downtrending, may require hospital admission for expedited evaluation.  Alternatively, could see if we can get an expedited EGD done in the endoscopy unit with me for diagnostic and therapeutic intent.  If EGD unrevealing, then plan on VCE for further small bowel interrogation.  Will await iron studies as well, but very low threshold to give IV iron based on most recent panel.  Needs to avoid NSAIDs.  Agree with increasing Protonix  to twice daily for the time being.  Due for repeat colonoscopy for polyp surveillance, but evaluation and treatment of anemia takes precedence right now.   Sandor Flatter, DO, [facility]

## 2024-11-29 NOTE — Telephone Encounter (Signed)
 I have spoken to [place] at [facility] who says they will accommodate outpatient procedure with Dr San on Tuesday, 12/04/24 at 730 am. Patient will need to arrive at 6 am for this.

## 2024-11-29 NOTE — Telephone Encounter (Signed)
 [name]

## 2024-11-29 NOTE — Progress Notes (Signed)
 Hemoglobin level resulted 8.0.  Patient denied having any current dizziness, shortness of breath or chest pain.  She will increase pantoprazole  to 40 mg twice daily and will start ferrous sulfate  325 mg 1 tab once daily as ordered at the time of her office visit earlier today.  Last dose of Wegovy  was taken 3 days agotherefore she is not a candidate for expedited EGD tomorrow.  She was scheduled for an EGD with Dr. San 12/11/2024.  Dr. San to consider placing VCE if EGD unrevealing.  She will return to her office on Monday, 12/03/2024 for repeat CBC.  Patient instructed to go to the emergency room if she develops profound fatigue, significant dizziness, shortness of breath or chest pain.

## 2024-11-29 NOTE — Patient Instructions (Addendum)
 _______________________________________________________  If your blood pressure at your visit was 140/90 or greater, please contact your primary care physician to follow up on this.  _______________________________________________________  If you are age 53 or older, your body mass index should be between 23-30. Your Body mass index is 33.47 kg/m. If this is out of the aforementioned range listed, please consider follow up with your Primary Care Provider.  If you are age 49 or younger, your body mass index should be between 19-25. Your Body mass index is 33.47 kg/m. If this is out of the aformentioned range listed, please consider follow up with your Primary Care Provider.   ________________________________________________________  The [facility] providers would like to encourage you to use MYCHART to communicate with providers for non-urgent requests or questions.  Due to long hold times on the telephone, sending your provider a message by [facility] may be a faster and more efficient way to get a response.  Please allow 48 business hours for a response.  Please remember that this is for non-urgent requests.  _______________________________________________________  [facility] is using a team-based approach to care.  Your team is made up of your doctor and two to three APPS. Our APPS (Nurse Practitioners and Physician Assistants) work with your physician to ensure care continuity for you. They are fully qualified to address your health concerns and develop a treatment plan. They communicate directly with your gastroenterologist to care for you. Seeing the Advanced Practice Practitioners on your physician's team can help you by facilitating care more promptly, often allowing for earlier appointments, access to diagnostic testing, procedures, and other specialty referrals.   Your provider has requested that you go to the basement level for lab work before leaving today. Press B on the  elevator. The lab is located at the first door on the left as you exit the elevator.  We have sent the following medications to your pharmacy for you to pick up at your convenience: Iron tablet daily  Please go to the ED if you develop significant fatigue or shortness of breath/chest pain  You have been scheduled for an endoscopy. Please follow written instructions given to you at your visit today.  If you use inhalers (even only as needed), please bring them with you on the day of your procedure.  If you take any of the following medications, they will need to be adjusted prior to your procedure:   DO NOT TAKE 7 DAYS PRIOR TO TEST- Trulicity (dulaglutide) Ozempic , Wegovy  (semaglutide ) Mounjaro, Zepbound (tirzepatide) Bydureon Bcise (exanatide extended release)  DO NOT TAKE 1 DAY PRIOR TO YOUR TEST Rybelsus  (semaglutide ) Adlyxin (lixisenatide) Victoza  (liraglutide ) Byetta (exanatide) ___________________________________________________________________________    Thank you for trusting me with your gastrointestinal care!   Katie Merritt, CRNP

## 2024-11-30 ENCOUNTER — Encounter: Payer: Self-pay | Admitting: Sports Medicine

## 2024-11-30 ENCOUNTER — Telehealth: Payer: Self-pay | Admitting: Sports Medicine

## 2024-11-30 NOTE — Telephone Encounter (Signed)
 Melissa with [facility] called and states that patient does not have prior auth for MRI with [employer] primary insurance. The prior shara is with the secondary and she says it is incorrect. She states that the authorization throught the secondary is for [facility] and the patient is scheduled at [facility] [place]. Please advise. She left phone number for questions [phone] ext. [phone]

## 2024-11-30 NOTE — Telephone Encounter (Signed)
 I have spoken to patient to advise of endoscopy that has been scheduled at [facility] on 12/04/24 with Dr San due to her hemoglobin of 8.0. She is advised of time/date/location for upcoming procedure and has been given generalized verbal prep instructions. Discussed that a care partner 18 years or older should bring her, stay for the procedure and drive home due to sedation. Written instructions have been made available to the patient for additional review in mychart.  Patient is also advised that she should come for CBC on Monday 12/03/24 at [facility] She verbalizes understanding of this information.  Patient has an MRI spine scheduled for 12/05/24. There is notation that Dr San may consider capsule endoscopy if upper endoscopy is unrevealing for cause of anemia. Dr San says that patient may keep her scheduled MRI on 2/11 and he will order outpatient capsule to be completed at a later time if needed.

## 2024-12-04 ENCOUNTER — Encounter (HOSPITAL_COMMUNITY): Admission: RE | Payer: Self-pay | Source: Home / Self Care

## 2024-12-04 ENCOUNTER — Ambulatory Visit (HOSPITAL_COMMUNITY): Admission: RE | Admit: 2024-12-04 | Source: Home / Self Care | Admitting: Gastroenterology

## 2024-12-05 ENCOUNTER — Ambulatory Visit (HOSPITAL_BASED_OUTPATIENT_CLINIC_OR_DEPARTMENT_OTHER)

## 2024-12-11 ENCOUNTER — Encounter: Admitting: Gastroenterology
